# Patient Record
Sex: Male | Born: 1970
Health system: Southern US, Community
[De-identification: ages and names within clinical notes are randomized; demographics above are authoritative.]

## PROBLEM LIST (undated history)

## (undated) DIAGNOSIS — K219 Gastro-esophageal reflux disease without esophagitis: Secondary | ICD-10-CM

## (undated) DIAGNOSIS — E119 Type 2 diabetes mellitus without complications: Secondary | ICD-10-CM

## (undated) DIAGNOSIS — G2581 Restless legs syndrome: Secondary | ICD-10-CM

## (undated) DIAGNOSIS — M545 Low back pain, unspecified: Secondary | ICD-10-CM

## (undated) DIAGNOSIS — J45909 Unspecified asthma, uncomplicated: Secondary | ICD-10-CM

## (undated) DIAGNOSIS — M109 Gout, unspecified: Secondary | ICD-10-CM

## (undated) DIAGNOSIS — L209 Atopic dermatitis, unspecified: Secondary | ICD-10-CM

## (undated) HISTORY — PX: EYE SURGERY: SHX253

## (undated) HISTORY — DX: Low back pain: M54.5

## (undated) HISTORY — DX: Type 2 diabetes mellitus without complications: E11.9

## (undated) HISTORY — DX: Gastro-esophageal reflux disease without esophagitis: K21.9

## (undated) HISTORY — DX: Atopic dermatitis, unspecified: L20.9

## (undated) HISTORY — DX: Low back pain, unspecified: M54.50

## (undated) HISTORY — DX: Unspecified asthma, uncomplicated: J45.909

---

## 1998-06-18 ENCOUNTER — Emergency Department (HOSPITAL_COMMUNITY): Admission: EM | Admit: 1998-06-18 | Discharge: 1998-06-18 | Payer: Self-pay | Admitting: Emergency Medicine

## 2001-01-09 ENCOUNTER — Emergency Department (HOSPITAL_COMMUNITY): Admission: EM | Admit: 2001-01-09 | Discharge: 2001-01-09 | Payer: Self-pay | Admitting: Emergency Medicine

## 2002-08-07 ENCOUNTER — Emergency Department (HOSPITAL_COMMUNITY): Admission: EM | Admit: 2002-08-07 | Discharge: 2002-08-07 | Payer: Self-pay | Admitting: Emergency Medicine

## 2004-08-18 ENCOUNTER — Ambulatory Visit: Payer: Self-pay | Admitting: Internal Medicine

## 2004-08-24 ENCOUNTER — Ambulatory Visit: Payer: Self-pay | Admitting: Family Medicine

## 2004-08-25 ENCOUNTER — Encounter: Payer: Self-pay | Admitting: Physician Assistant

## 2004-08-31 ENCOUNTER — Ambulatory Visit: Payer: Self-pay | Admitting: *Deleted

## 2004-09-07 ENCOUNTER — Ambulatory Visit: Payer: Self-pay | Admitting: Family Medicine

## 2004-09-21 ENCOUNTER — Ambulatory Visit: Payer: Self-pay | Admitting: Internal Medicine

## 2004-09-26 ENCOUNTER — Emergency Department (HOSPITAL_COMMUNITY): Admission: EM | Admit: 2004-09-26 | Discharge: 2004-09-26 | Payer: Self-pay | Admitting: Emergency Medicine

## 2004-11-29 ENCOUNTER — Ambulatory Visit: Payer: Self-pay | Admitting: Family Medicine

## 2005-01-10 ENCOUNTER — Ambulatory Visit: Payer: Self-pay | Admitting: Family Medicine

## 2005-01-19 ENCOUNTER — Ambulatory Visit: Payer: Self-pay | Admitting: Family Medicine

## 2005-02-11 ENCOUNTER — Ambulatory Visit: Payer: Self-pay | Admitting: Family Medicine

## 2005-03-14 ENCOUNTER — Ambulatory Visit: Payer: Self-pay | Admitting: Family Medicine

## 2005-04-13 ENCOUNTER — Ambulatory Visit: Payer: Self-pay | Admitting: Family Medicine

## 2005-05-09 ENCOUNTER — Ambulatory Visit: Payer: Self-pay | Admitting: Family Medicine

## 2005-07-12 ENCOUNTER — Ambulatory Visit: Payer: Self-pay | Admitting: Family Medicine

## 2005-08-12 ENCOUNTER — Ambulatory Visit (HOSPITAL_COMMUNITY): Admission: RE | Admit: 2005-08-12 | Discharge: 2005-08-12 | Payer: Self-pay | Admitting: Family Medicine

## 2005-09-05 ENCOUNTER — Ambulatory Visit: Payer: Self-pay | Admitting: Family Medicine

## 2005-10-06 ENCOUNTER — Ambulatory Visit: Payer: Self-pay | Admitting: Internal Medicine

## 2005-10-10 ENCOUNTER — Ambulatory Visit: Payer: Self-pay | Admitting: Family Medicine

## 2005-11-17 ENCOUNTER — Ambulatory Visit: Payer: Self-pay | Admitting: Family Medicine

## 2005-12-13 ENCOUNTER — Ambulatory Visit: Payer: Self-pay | Admitting: Family Medicine

## 2006-01-10 ENCOUNTER — Ambulatory Visit: Payer: Self-pay | Admitting: Family Medicine

## 2006-04-11 ENCOUNTER — Ambulatory Visit: Payer: Self-pay | Admitting: Family Medicine

## 2006-04-11 LAB — CONVERTED CEMR LAB: WBC, blood: 9.7 10*3/uL

## 2006-06-12 ENCOUNTER — Ambulatory Visit: Payer: Self-pay | Admitting: Family Medicine

## 2006-06-12 LAB — CONVERTED CEMR LAB: WBC, blood: 9.4 10*3/uL

## 2006-07-25 ENCOUNTER — Ambulatory Visit: Payer: Self-pay | Admitting: Family Medicine

## 2006-09-01 ENCOUNTER — Encounter: Admission: RE | Admit: 2006-09-01 | Discharge: 2006-09-11 | Payer: Self-pay | Admitting: Family Medicine

## 2006-10-11 ENCOUNTER — Ambulatory Visit: Payer: Self-pay | Admitting: Family Medicine

## 2006-10-11 LAB — CONVERTED CEMR LAB: WBC, blood: 9.4 10*3/uL

## 2007-01-15 ENCOUNTER — Ambulatory Visit: Payer: Self-pay | Admitting: Family Medicine

## 2007-01-15 LAB — CONVERTED CEMR LAB: WBC, blood: 12.7 10*3/uL

## 2007-02-24 ENCOUNTER — Encounter (INDEPENDENT_AMBULATORY_CARE_PROVIDER_SITE_OTHER): Payer: Self-pay | Admitting: Family Medicine

## 2007-02-24 DIAGNOSIS — Z8709 Personal history of other diseases of the respiratory system: Secondary | ICD-10-CM | POA: Insufficient documentation

## 2007-02-24 DIAGNOSIS — H01009 Unspecified blepharitis unspecified eye, unspecified eyelid: Secondary | ICD-10-CM | POA: Insufficient documentation

## 2007-02-24 DIAGNOSIS — M545 Low back pain, unspecified: Secondary | ICD-10-CM | POA: Insufficient documentation

## 2007-02-24 DIAGNOSIS — F411 Generalized anxiety disorder: Secondary | ICD-10-CM | POA: Insufficient documentation

## 2007-02-24 DIAGNOSIS — J45909 Unspecified asthma, uncomplicated: Secondary | ICD-10-CM | POA: Insufficient documentation

## 2007-02-24 DIAGNOSIS — L2089 Other atopic dermatitis: Secondary | ICD-10-CM | POA: Insufficient documentation

## 2007-04-16 ENCOUNTER — Emergency Department (HOSPITAL_COMMUNITY): Admission: EM | Admit: 2007-04-16 | Discharge: 2007-04-16 | Payer: Self-pay | Admitting: Emergency Medicine

## 2007-05-25 ENCOUNTER — Ambulatory Visit: Payer: Self-pay | Admitting: Family Medicine

## 2007-05-26 ENCOUNTER — Encounter (INDEPENDENT_AMBULATORY_CARE_PROVIDER_SITE_OTHER): Payer: Self-pay | Admitting: Family Medicine

## 2007-05-26 LAB — CONVERTED CEMR LAB
ALT: 28 units/L (ref 0–53)
AST: 19 units/L (ref 0–37)
Albumin: 4.4 g/dL (ref 3.5–5.2)
Alkaline Phosphatase: 79 units/L (ref 39–117)
BUN: 8 mg/dL (ref 6–23)
Basophils Absolute: 0 10*3/uL (ref 0.0–0.1)
Basophils Relative: 0 % (ref 0–1)
CO2: 24 meq/L (ref 19–32)
Calcium: 9.2 mg/dL (ref 8.4–10.5)
Chloride: 107 meq/L (ref 96–112)
Creatinine, Ser: 0.81 mg/dL (ref 0.40–1.50)
Eosinophils Absolute: 0.3 10*3/uL (ref 0.0–0.7)
Eosinophils Relative: 3 % (ref 0–5)
Glucose, Bld: 114 mg/dL — ABNORMAL HIGH (ref 70–99)
HCT: 46.4 % (ref 39.0–52.0)
Hemoglobin: 15.2 g/dL (ref 13.0–17.0)
Lymphocytes Relative: 29 % (ref 12–46)
Lymphs Abs: 3.3 10*3/uL (ref 0.7–3.3)
MCHC: 32.8 g/dL (ref 30.0–36.0)
MCV: 93 fL (ref 78.0–100.0)
Monocytes Absolute: 1 10*3/uL — ABNORMAL HIGH (ref 0.2–0.7)
Monocytes Relative: 9 % (ref 3–11)
Neutro Abs: 6.8 10*3/uL (ref 1.7–7.7)
Neutrophils Relative %: 59 % (ref 43–77)
Platelets: 234 10*3/uL (ref 150–400)
Potassium: 4.3 meq/L (ref 3.5–5.3)
RBC: 4.99 M/uL (ref 4.22–5.81)
RDW: 15.3 % — ABNORMAL HIGH (ref 11.5–14.0)
Sodium: 143 meq/L (ref 135–145)
Total Bilirubin: 0.3 mg/dL (ref 0.3–1.2)
Total Protein: 7.1 g/dL (ref 6.0–8.3)
WBC: 11.5 10*3/uL — ABNORMAL HIGH (ref 4.0–10.5)

## 2007-08-08 ENCOUNTER — Telehealth (INDEPENDENT_AMBULATORY_CARE_PROVIDER_SITE_OTHER): Payer: Self-pay | Admitting: Family Medicine

## 2007-08-22 ENCOUNTER — Encounter: Payer: Self-pay | Admitting: Family Medicine

## 2007-08-22 ENCOUNTER — Telehealth (INDEPENDENT_AMBULATORY_CARE_PROVIDER_SITE_OTHER): Payer: Self-pay | Admitting: Family Medicine

## 2007-08-23 LAB — CONVERTED CEMR LAB
ALT: 27 units/L (ref 0–53)
AST: 20 units/L (ref 0–37)
Albumin: 4.7 g/dL (ref 3.5–5.2)
Alkaline Phosphatase: 80 units/L (ref 39–117)
BUN: 8 mg/dL (ref 6–23)
Basophils Absolute: 0 10*3/uL (ref 0.0–0.1)
Basophils Relative: 0 % (ref 0–1)
CO2: 24 meq/L (ref 19–32)
Calcium: 9.5 mg/dL (ref 8.4–10.5)
Chloride: 103 meq/L (ref 96–112)
Creatinine, Ser: 0.79 mg/dL (ref 0.40–1.50)
Eosinophils Absolute: 0.4 10*3/uL (ref 0.0–0.7)
Eosinophils Relative: 4 % (ref 0–5)
Glucose, Bld: 67 mg/dL — ABNORMAL LOW (ref 70–99)
HCT: 47.6 % (ref 39.0–52.0)
Hemoglobin: 16.2 g/dL (ref 13.0–17.0)
Lymphocytes Relative: 20 % (ref 12–46)
Lymphs Abs: 1.9 10*3/uL (ref 0.7–3.3)
MCHC: 34 g/dL (ref 30.0–36.0)
MCV: 90.2 fL (ref 78.0–100.0)
Monocytes Absolute: 1.1 10*3/uL — ABNORMAL HIGH (ref 0.2–0.7)
Monocytes Relative: 11 % (ref 3–11)
Neutro Abs: 6.4 10*3/uL (ref 1.7–7.7)
Neutrophils Relative %: 65 % (ref 43–77)
Platelets: 271 10*3/uL (ref 150–400)
Potassium: 4.5 meq/L (ref 3.5–5.3)
RBC: 5.28 M/uL (ref 4.22–5.81)
RDW: 13.7 % (ref 11.5–14.0)
Sodium: 140 meq/L (ref 135–145)
Total Bilirubin: 0.3 mg/dL (ref 0.3–1.2)
Total Protein: 7.3 g/dL (ref 6.0–8.3)
WBC: 9.8 10*3/uL (ref 4.0–10.5)

## 2007-08-28 ENCOUNTER — Ambulatory Visit (HOSPITAL_COMMUNITY): Admission: RE | Admit: 2007-08-28 | Discharge: 2007-08-28 | Payer: Self-pay | Admitting: Family Medicine

## 2007-08-28 ENCOUNTER — Encounter (INDEPENDENT_AMBULATORY_CARE_PROVIDER_SITE_OTHER): Payer: Self-pay | Admitting: Family Medicine

## 2007-10-12 ENCOUNTER — Telehealth (INDEPENDENT_AMBULATORY_CARE_PROVIDER_SITE_OTHER): Payer: Self-pay | Admitting: Family Medicine

## 2007-11-20 ENCOUNTER — Telehealth (INDEPENDENT_AMBULATORY_CARE_PROVIDER_SITE_OTHER): Payer: Self-pay | Admitting: Family Medicine

## 2007-12-07 ENCOUNTER — Encounter (INDEPENDENT_AMBULATORY_CARE_PROVIDER_SITE_OTHER): Payer: Self-pay | Admitting: Family Medicine

## 2007-12-19 ENCOUNTER — Encounter (INDEPENDENT_AMBULATORY_CARE_PROVIDER_SITE_OTHER): Payer: Self-pay | Admitting: Family Medicine

## 2007-12-20 ENCOUNTER — Encounter: Payer: Self-pay | Admitting: Physician Assistant

## 2008-05-11 ENCOUNTER — Telehealth (INDEPENDENT_AMBULATORY_CARE_PROVIDER_SITE_OTHER): Payer: Self-pay | Admitting: *Deleted

## 2008-06-11 ENCOUNTER — Ambulatory Visit: Payer: Self-pay | Admitting: Family Medicine

## 2008-06-11 DIAGNOSIS — G4700 Insomnia, unspecified: Secondary | ICD-10-CM | POA: Insufficient documentation

## 2008-08-21 ENCOUNTER — Telehealth (INDEPENDENT_AMBULATORY_CARE_PROVIDER_SITE_OTHER): Payer: Self-pay | Admitting: Family Medicine

## 2008-08-21 LAB — CONVERTED CEMR LAB
ALT: 30 units/L (ref 0–53)
AST: 18 units/L (ref 0–37)
Albumin: 5.1 g/dL (ref 3.5–5.2)
Alkaline Phosphatase: 72 units/L (ref 39–117)
BUN: 6 mg/dL (ref 6–23)
CO2: 24 meq/L (ref 19–32)
Calcium: 9.6 mg/dL (ref 8.4–10.5)
Chloride: 104 meq/L (ref 96–112)
Cholesterol: 243 mg/dL — ABNORMAL HIGH (ref 0–200)
Creatinine, Ser: 0.72 mg/dL (ref 0.40–1.50)
Glucose, Bld: 62 mg/dL — ABNORMAL LOW (ref 70–99)
HDL: 47 mg/dL (ref >39)
LDL Cholesterol: 127 mg/dL — ABNORMAL HIGH (ref 0–99)
Potassium: 4.8 meq/L (ref 3.5–5.3)
Sodium: 143 meq/L (ref 135–145)
Total Bilirubin: 0.3 mg/dL (ref 0.3–1.2)
Total CHOL/HDL Ratio: 5.2
Total Protein: 7.7 g/dL (ref 6.0–8.3)
Triglycerides: 345 mg/dL — ABNORMAL HIGH (ref <150)
VLDL: 69 mg/dL — ABNORMAL HIGH (ref 0–40)

## 2008-08-22 ENCOUNTER — Encounter (INDEPENDENT_AMBULATORY_CARE_PROVIDER_SITE_OTHER): Payer: Self-pay | Admitting: Family Medicine

## 2008-08-22 ENCOUNTER — Ambulatory Visit (HOSPITAL_COMMUNITY): Admission: RE | Admit: 2008-08-22 | Discharge: 2008-08-22 | Payer: Self-pay | Admitting: Dermatology

## 2008-08-22 ENCOUNTER — Encounter (INDEPENDENT_AMBULATORY_CARE_PROVIDER_SITE_OTHER): Payer: Self-pay | Admitting: Radiology

## 2008-11-07 ENCOUNTER — Encounter (INDEPENDENT_AMBULATORY_CARE_PROVIDER_SITE_OTHER): Payer: Self-pay | Admitting: Family Medicine

## 2008-11-11 ENCOUNTER — Encounter (INDEPENDENT_AMBULATORY_CARE_PROVIDER_SITE_OTHER): Payer: Self-pay | Admitting: Family Medicine

## 2008-12-22 ENCOUNTER — Ambulatory Visit: Payer: Self-pay | Admitting: Family Medicine

## 2009-05-11 ENCOUNTER — Ambulatory Visit: Payer: Self-pay | Admitting: Physician Assistant

## 2009-07-07 ENCOUNTER — Ambulatory Visit: Payer: Self-pay | Admitting: Physician Assistant

## 2009-07-07 LAB — CONVERTED CEMR LAB
ALT: 28 units/L (ref 0–53)
AST: 19 units/L (ref 0–37)
Albumin: 4.8 g/dL (ref 3.5–5.2)
Alkaline Phosphatase: 80 units/L (ref 39–117)
BUN: 12 mg/dL (ref 6–23)
Basophils Absolute: 0 10*3/uL (ref 0.0–0.1)
Basophils Relative: 0 % (ref 0–1)
CO2: 25 meq/L (ref 19–32)
Calcium: 9.7 mg/dL (ref 8.4–10.5)
Chloride: 106 meq/L (ref 96–112)
Creatinine, Ser: 0.96 mg/dL (ref 0.40–1.50)
Eosinophils Absolute: 0.4 10*3/uL (ref 0.0–0.7)
Eosinophils Relative: 4 % (ref 0–5)
Glucose, Bld: 92 mg/dL (ref 70–99)
HCT: 46.1 % (ref 39.0–52.0)
Hemoglobin: 15.3 g/dL (ref 13.0–17.0)
Lymphocytes Relative: 24 % (ref 12–46)
Lymphs Abs: 2.5 10*3/uL (ref 0.7–4.0)
MCHC: 33.2 g/dL (ref 30.0–36.0)
MCV: 88.3 fL (ref 78.0–100.0)
Monocytes Absolute: 1.2 10*3/uL — ABNORMAL HIGH (ref 0.1–1.0)
Monocytes Relative: 12 % (ref 3–12)
Neutro Abs: 6.1 10*3/uL (ref 1.7–7.7)
Neutrophils Relative %: 60 % (ref 43–77)
Platelets: 248 10*3/uL (ref 150–400)
Potassium: 5.1 meq/L (ref 3.5–5.3)
RBC: 5.22 M/uL (ref 4.22–5.81)
RDW: 14.5 % (ref 11.5–15.5)
Sodium: 141 meq/L (ref 135–145)
Total Bilirubin: 0.3 mg/dL (ref 0.3–1.2)
Total Protein: 7.4 g/dL (ref 6.0–8.3)
WBC: 10.1 10*3/uL (ref 4.0–10.5)

## 2009-07-08 ENCOUNTER — Telehealth: Payer: Self-pay | Admitting: Physician Assistant

## 2009-07-14 ENCOUNTER — Encounter: Payer: Self-pay | Admitting: Physician Assistant

## 2009-07-27 ENCOUNTER — Encounter: Payer: Self-pay | Admitting: Physician Assistant

## 2009-12-04 ENCOUNTER — Encounter: Payer: Self-pay | Admitting: Physician Assistant

## 2010-05-21 ENCOUNTER — Ambulatory Visit: Payer: Self-pay | Admitting: Physician Assistant

## 2010-05-21 DIAGNOSIS — F329 Major depressive disorder, single episode, unspecified: Secondary | ICD-10-CM | POA: Insufficient documentation

## 2010-05-21 DIAGNOSIS — G2581 Restless legs syndrome: Secondary | ICD-10-CM | POA: Insufficient documentation

## 2010-05-24 ENCOUNTER — Ambulatory Visit: Payer: Self-pay | Admitting: Physician Assistant

## 2010-05-25 LAB — CONVERTED CEMR LAB
ALT: 33 units/L (ref 0–53)
AST: 23 units/L (ref 0–37)
Albumin: 4.7 g/dL (ref 3.5–5.2)
Alkaline Phosphatase: 62 units/L (ref 39–117)
BUN: 11 mg/dL (ref 6–23)
Basophils Absolute: 0 10*3/uL (ref 0.0–0.1)
Basophils Relative: 0 % (ref 0–1)
CO2: 24 meq/L (ref 19–32)
Calcium: 9.7 mg/dL (ref 8.4–10.5)
Chloride: 104 meq/L (ref 96–112)
Creatinine, Ser: 1.03 mg/dL (ref 0.40–1.50)
Eosinophils Absolute: 0.4 10*3/uL (ref 0.0–0.7)
Eosinophils Relative: 5 % (ref 0–5)
Ferritin: 191 ng/mL (ref 22–322)
Glucose, Bld: 124 mg/dL — ABNORMAL HIGH (ref 70–99)
HCT: 42.9 % (ref 39.0–52.0)
Hemoglobin: 13.9 g/dL (ref 13.0–17.0)
Lymphocytes Relative: 23 % (ref 12–46)
Lymphs Abs: 1.9 10*3/uL (ref 0.7–4.0)
MCHC: 32.4 g/dL (ref 30.0–36.0)
MCV: 89.7 fL (ref 78.0–100.0)
Monocytes Absolute: 0.8 10*3/uL (ref 0.1–1.0)
Monocytes Relative: 9 % (ref 3–12)
Neutro Abs: 5.4 10*3/uL (ref 1.7–7.7)
Neutrophils Relative %: 63 % (ref 43–77)
Platelets: 241 10*3/uL (ref 150–400)
Potassium: 4.6 meq/L (ref 3.5–5.3)
RBC: 4.78 M/uL (ref 4.22–5.81)
RDW: 14.4 % (ref 11.5–15.5)
Sodium: 141 meq/L (ref 135–145)
Total Bilirubin: 0.3 mg/dL (ref 0.3–1.2)
Total Protein: 7.1 g/dL (ref 6.0–8.3)
WBC: 8.4 10*3/uL (ref 4.0–10.5)

## 2010-06-04 ENCOUNTER — Ambulatory Visit: Payer: Self-pay | Admitting: Physician Assistant

## 2010-06-04 DIAGNOSIS — E739 Lactose intolerance, unspecified: Secondary | ICD-10-CM | POA: Insufficient documentation

## 2010-06-04 LAB — CONVERTED CEMR LAB: Hgb A1c MFr Bld: 6.2 %

## 2010-06-18 ENCOUNTER — Telehealth: Payer: Self-pay | Admitting: Physician Assistant

## 2010-06-18 ENCOUNTER — Ambulatory Visit: Payer: Self-pay | Admitting: Physician Assistant

## 2010-07-16 ENCOUNTER — Ambulatory Visit: Payer: Self-pay | Admitting: Physician Assistant

## 2010-07-16 ENCOUNTER — Telehealth: Payer: Self-pay | Admitting: Physician Assistant

## 2010-07-20 ENCOUNTER — Ambulatory Visit: Payer: Self-pay | Admitting: Physician Assistant

## 2010-07-20 LAB — CONVERTED CEMR LAB: Blood Glucose, Fingerstick: 147

## 2010-08-02 ENCOUNTER — Ambulatory Visit: Payer: Self-pay | Admitting: Physician Assistant

## 2010-08-17 ENCOUNTER — Encounter: Payer: Self-pay | Admitting: Physician Assistant

## 2010-08-17 ENCOUNTER — Ambulatory Visit: Payer: Self-pay | Admitting: Internal Medicine

## 2010-08-26 ENCOUNTER — Ambulatory Visit: Payer: Self-pay | Admitting: Nurse Practitioner

## 2010-10-01 ENCOUNTER — Encounter: Payer: Self-pay | Admitting: Physician Assistant

## 2010-10-19 ENCOUNTER — Ambulatory Visit: Payer: Self-pay | Admitting: Internal Medicine

## 2010-10-22 ENCOUNTER — Encounter: Payer: Self-pay | Admitting: Physician Assistant

## 2010-11-25 NOTE — Progress Notes (Signed)
Summary: Increase Trazodone.  Phone Note Other Incoming   Summary of Call: Spoke to AAnanias Pilgrim.  Pt in today for counseling.  Notes trouble sleeping.  Trazodone helps.  But, not enough. Will increase to 100 mg at bedtime as needed. Initial call taken by: Brynda Rim,  July 16, 2010 9:19 AM    New/Updated Medications: TRAZODONE HCL 100 MG TABS (TRAZODONE HCL) Take 1 tab by mouth at bedtime as needed for sleep Prescriptions: TRAZODONE HCL 100 MG TABS (TRAZODONE HCL) Take 1 tab by mouth at bedtime as needed for sleep  #20 per month x 3   Entered and Authorized by:   Tereso Newcomer PA-C   Signed by:   Tereso Newcomer PA-C on 07/16/2010   Method used:   Print then Give to Patient   RxID:   [id number]

## 2010-11-25 NOTE — Assessment & Plan Note (Signed)
Summary: check liver fuction/ discuss medications//gk   Vital Signs:  Patient profile:   40 year old male Weight:      212.5 pounds Temp:     97.7 degrees F oral Pulse rhythm:   regular Resp:     20 per minute BP sitting:   138 / 92  (left arm) Cuff size:   regular  Vitals Entered By: Mikey College CMA (June 24, 2009 9:32 AM) CC: PT HERE FOR 6WK F/U Pain Assessment Patient in pain? yes     Location: LBP Intensity: 3-4  Does patient need assistance? Functional Status Self care Ambulation Normal   CC:  PT HERE FOR 6WK F/U.  History of Present Illness: pt missed appointmetn yesterday on follow up with Scott.  He didn't know his rechedule today was with another provider.  He only wanted to see Lorin Picket, so he will be rescheduled to see Scott at pt request.  Allergies: 1)  ! * Peanuts  Physical Exam  General:  Well-developed,well-nourished,in no acute distress; alert,appropriate and cooperative throughout examination Psych:  frustrated and mildly aggitated about appointment   Impression & Recommendations:  Problem # 1:  FOLLOW-UP, HIGH RISK TREATMENT NEC (ICD-V67.51) pt will be rescheduled for PCP  Complete Medication List: 1)  Folic Acid 1 Mg Tabs (Folic acid) .Marland Kitchen.. 1 by mouth once daily 2)  Methotrexate 2.5 Mg Tabs (Methotrexate sodium) .... Take 6 tablets by mouth .qweek per dermatologist 3)  Elidel 1% Cream  .... Apply to rash two times a day 4)  Atarax 25 Mg  .Marland KitchenMarland Kitchen. 1 by mouth at bedtime as needed for itching 5)  Albuterol 90 Mcg/act Aers (Albuterol) .... 2puffs q4-6hrs prn 6)  Fish Oil Oil (Fish oil) .... 2 capsules per day 7)  Doxycycline Monohydrate 50 Mg Tabs (Doxycycline monohydrate) .... Take 1 tablet by mouth once a day from dr.gould(chronic) 8)  Seroquel 25 Mg Tabs (Quetiapine fumarate) .... Take 1 tab by mouth at bedtime 9)  Combivent 103-18 Mcg/act Aero (Ipratropium-albuterol) .Marland Kitchen.. 1-2 puffs three to four times a day 10)  Pepcid 20 Mg Tabs (Famotidine)  .... Take 1 tablet by mouth two times a day

## 2010-11-25 NOTE — Letter (Signed)
[facility]   Imported By: Arta Bruce 01/26/2010 15:54:23  _____________________________________________________________________  External Attachment:    Type:   Image     Comment:   External Document

## 2010-11-25 NOTE — Assessment & Plan Note (Signed)
Summary: SCHED WITH SCOTT IN 2-3 WEEKS FBS,A1C//GK   Vital Signs:  Patient profile:   40 year old male Height:      56.5 inches Weight:      217 pounds BMI:     47.97 O2 Sat:      97 % on Room air Temp:     97.4 degrees F oral Pulse rate:   82 / minute Pulse rhythm:   regular Resp:     18 per minute BP sitting:   132 / 88  (left arm) Cuff size:   regular  Vitals Entered By: [name] (June 04, 2010 11:12 AM)  O2 Flow:  Room air CC: f/u.... Is Patient Diabetic? No Pain Assessment Patient in pain? no       Does patient need assistance? Functional Status Self care Ambulation Normal   Primary Care Provider:  Tereso Newcomer PA-C  CC:  f/u.....  History of Present Illness: Here for f/u on depression. Stressed with dad.  Needs CABG.  Grandmother has Alzheimer's. Started Prozac.  No side effects.  He denies suicidal ideations.   Current Medications (verified): 1)  Folic Acid 400 Mcg Tabs (Folic Acid) .... Take 1 Tablet By Mouth Once A Day 2)  Methotrexate 2.5 Mg  Tabs (Methotrexate Sodium) .... Take 4 Tabs By Mouth Every Weeks 3)  Elidel 1% Cream + Hydrocortisone Valeate Crm0.2% - 50/50 Mix .... Apply To Rash Two Times A Day As Directed 4)  Hydroxyzine Pamoate 25 Mg Caps (Hydroxyzine Pamoate) .Marland Kitchen.. 1 To 4 By Mouth Once Daily As Needed 5)  Fish Oil   Oil (Fish Oil) .... 2 Capsules Per Day 6)  Doxycycline Hyclate 100 Mg Tabs (Doxycycline Hyclate) .... Take 1 Tablet By Mouth Once A Day 7)  Combivent 103-18 Mcg/act Aero (Ipratropium-Albuterol) .Marland Kitchen.. 1-2 Puffs Three To Four Times A Day 8)  Pepcid 20 Mg Tabs (Famotidine) .... Take 1 Tab By Mouth At Bedtime As Needed 9)  Clobetasol Propionate 0.05 % Crea (Clobetasol Propionate) .... As Directed 10)  Cromolyn Sodium 4 % Soln (Cromolyn Sodium) .... Eye Drops 4 Times A Day 11)  Fml 0.1 % Oint (Fluorometholone) .Marland Kitchen.. 1 Week On and 1 Week Off 12)  Refresh Tears 0.5 % Soln (Carboxymethylcellulose Sodium) .... As Directed 13)   Prozac 20 Mg Caps (Fluoxetine Hcl) .... Take 1 Tablet By Mouth Once A Day 14)  Trazodone Hcl 50 Mg Tabs (Trazodone Hcl) .... Take 1 Tab By Mouth At Bedtime As Needed For Sleep  Allergies (verified): 1)  ! * Peanuts  Physical Exam  General:  alert, well-developed, and well-nourished.   Head:  normocephalic and atraumatic.   Lungs:  normal breath sounds, no crackles, and no wheezes.   Heart:  normal rate and regular rhythm.   Neurologic:  alert & oriented X3 and cranial nerves II-XII intact.   Psych:  flat affect.     Impression & Recommendations:  Problem # 1:  DEPRESSION (ICD-311) has appt with Marchelle Folks today no problems with xanax trazodone helping with sleep  His updated medication list for this problem includes:    Hydroxyzine Pamoate 25 Mg Caps (Hydroxyzine pamoate) .Marland Kitchen... 1 to 4 by mouth once daily as needed    Prozac 20 Mg Caps (Fluoxetine hcl) .Marland Kitchen... Take 1 tablet by mouth once a day    Trazodone Hcl 50 Mg Tabs (Trazodone hcl) .Marland Kitchen... Take 1 tab by mouth at bedtime as needed for sleep  Problem # 2:  ASTHMA (ICD-493.90) sent PA off for  asmanex will start now  His updated medication list for this problem includes:    Combivent 103-18 Mcg/act Aero (Ipratropium-albuterol) .Marland Kitchen... 1-2 puffs three to four times a day    Asmanex 30 Metered Doses 220 Mcg/inh Aepb (Mometasone furoate) .Marland Kitchen... 1 inhalation at bedtime; rinse mouth out well after each use.  Problem # 3:  HYPERGLYCEMIA (ICD-790.29) d/w Laroy Apple regarding glucose intol schedule with Susie for diet  Orders: Hemoglobin A1C (83036)  Complete Medication List: 1)  Folic Acid 400 Mcg Tabs (Folic acid) .... Take 1 tablet by mouth once a day 2)  Methotrexate 2.5 Mg Tabs (Methotrexate sodium) .... Take 4 tabs by mouth every weeks 3)  Elidel 1% Cream + Hydrocortisone Valeate Crm0.2% - 50/50 Mix  .... Apply to rash two times a day as directed 4)  Hydroxyzine Pamoate 25 Mg Caps (Hydroxyzine pamoate) .Marland Kitchen.. 1 to 4 by mouth once  daily as needed 5)  Fish Oil Oil (Fish oil) .... 2 capsules per day 6)  Doxycycline Hyclate 100 Mg Tabs (Doxycycline hyclate) .... Take 1 tablet by mouth once a day 7)  Combivent 103-18 Mcg/act Aero (Ipratropium-albuterol) .Marland Kitchen.. 1-2 puffs three to four times a day 8)  Pepcid 20 Mg Tabs (Famotidine) .... Take 1 tab by mouth at bedtime as needed 9)  Clobetasol Propionate 0.05 % Crea (Clobetasol propionate) .... As directed 10)  Cromolyn Sodium 4 % Soln (Cromolyn sodium) .... Eye drops 4 times a day 11)  Fml 0.1 % Oint (Fluorometholone) .Marland KitchenMarland KitchenMarland Kitchen 1 week on and 1 week off 12)  Refresh Tears 0.5 % Soln (Carboxymethylcellulose sodium) .... As directed 13)  Prozac 20 Mg Caps (Fluoxetine hcl) .... Take 1 tablet by mouth once a day 14)  Trazodone Hcl 50 Mg Tabs (Trazodone hcl) .... Take 1 tab by mouth at bedtime as needed for sleep 15)  Asmanex 30 Metered Doses 220 Mcg/inh Aepb (Mometasone furoate) .Marland Kitchen.. 1 inhalation at bedtime; rinse mouth out well after each use.  Patient Instructions: 1)  Please schedule a follow-up appointment in 6-8 weeks with Scott for asthma and depression.  2)  Schedule appt with Susie Piper for diet education (glucose intolerance). Prescriptions: TRAZODONE HCL 50 MG TABS (TRAZODONE HCL) Take 1 tab by mouth at bedtime as needed for sleep  #20 per month x 3   Entered and Authorized by:   Tereso Newcomer PA-C   Signed by:   Tereso Newcomer PA-C on 06/04/2010   Method used:   Print then Give to Patient   RxID:   [id number] ASMANEX 30 METERED DOSES 220 MCG/INH AEPB (MOMETASONE FUROATE) 1 inhalation at bedtime; rinse mouth out well after each use.  #1 x 5   Entered and Authorized by:   Tereso Newcomer PA-C   Signed by:   Tereso Newcomer PA-C on 06/04/2010   Method used:   Print then Give to Patient   RxID:   [id number]   Laboratory Results   Blood Tests     HGBA1C: 6.2%   (Normal Range: Non-Diabetic - 3-6%   Control Diabetic - 6-8%)

## 2010-11-25 NOTE — Medication Information (Signed)
Summary: PA APPROVED/ASMANEX  PA APPROVED/ASMANEX   Imported By: Arta Bruce 06/16/2010 12:53:47  _____________________________________________________________________  External Attachment:    Type:   Image     Comment:   External Document

## 2010-11-25 NOTE — Letter (Signed)
Summary: MAILED RECORDS TO [facility]  MAILED RECORDS TO [employer]   Imported By: Arta Bruce 10/22/2010 09:38:16  _____________________________________________________________________  External Attachment:    Type:   Image     Comment:   External Document

## 2010-11-25 NOTE — Assessment & Plan Note (Signed)
Summary: RU VISIT WITH SCOTT//GK   Vital Signs:  Patient profile:   40 year old male Height:      56.5 inches Weight:      219 pounds BMI:     48.41 O2 Sat:      99 % on Room air Temp:     97.6 degrees F oral Pulse rate:   80 / minute Pulse rhythm:   regular Resp:     18 per minute BP sitting:   137 / 93  (left arm) Cuff size:   regular  Vitals Entered By: [name] (May 21, 2010 9:20 AM)  O2 Flow:  Room air CC: f/u Is Patient Diabetic? No Pain Assessment Patient in pain? no       Does patient need assistance? Functional Status Self care Ambulation Normal Comments PF  1.   320    2.  400      3.  390   Primary Care Provider:  Tereso Newcomer PA-C  CC:  f/u.  History of Present Illness: Here for f/u.  Atopic Dermatitis:  Sees Dr. Mayford Knife for dermatology.  Still on MTX . Marland Kitchen . takes 4 tabs q week.  States dermatology draws his labs every 3-4 months.    Blepharitis:  Sees ophthalmology.  Asthma:  Was taking Asmanex with the allergist.  It was helping.  Medicaid would not pay.  Very upset when I walk in.  Had a hard time getting his blood drawn.  Shows me his arm and states, "I am going to get a picture of this when I get home and show it to the right people."  States the CMA was laughing about him when they walked out of room.  I left to take a phone call and the patient was crying when I walked back in.  He does admit to depression and feeling on edge more.  Denies suicidal plans.  Lives with his dad.  Has trouble sleeping.  Does not work.  On disability from his atopy and asthma.  Notes restless legs about 1 time every 1-2 months.  Has to get up and move around to feel better.  Also, having trouble remembering things.  Asthma History    Initial Asthma Severity Rating:    Age range: 12+ years    Symptoms: daily    Nighttime Awakenings: 0-2/month    Interferes w/ normal activity: minor limitations    SABA use (not for EIB): daily    Asthma Severity Assessment:  Moderate Persistent   Problems Prior to Update: 1)  F/u Exam Follow Cmpl Tx W/high-risk Med Nec  (ICD-V67.51) 2)  Cholesterol, Screening  (ICD-V77.91) 3)  Insomnia  (ICD-780.52) 4)  Follow-up, High Risk Treatment Nec  (ICD-V67.51) 5)  Ekg  () 6)  Steroid Use, Long Term  (ICD-V58.65) 7)  Back Pain  (ICD-724.5) 8)  Dermatitis, Other Atopic  (ICD-691.8) 9)  Anxiety Disorder, Generalized  (ICD-300.02) 10)  Blepharitis Nos  (ICD-373.00) 11)  Pneumonia, Hx of  (ICD-V12.60) 12)  Asthma  (ICD-493.90)  Current Medications (verified): 1)  Folic Acid 1 Mg Tabs (Folic Acid) .Marland Kitchen.. 1 By Mouth Once Daily 2)  Methotrexate 2.5 Mg  Tabs (Methotrexate Sodium) .... Take 6 Tablets By Mouth .qweek Per Dermatologist 3)  Elidel 1% Cream .... Apply To Rash Two Times A Day 4)  Atarax 25 Mg .Marland Kitchen.. 1 By Mouth At Bedtime As Needed For Itching 5)  Albuterol 90 Mcg/act Aers (Albuterol) .... 2puffs Q4-6hrs Prn 6)  Fish Oil  Oil (Fish Oil) .... 2 Capsules Per Day 7)  Doxycycline Monohydrate 50 Mg  Tabs (Doxycycline Monohydrate) .... Take 1 Tablet By Mouth Once A Day From Dr.gould(Chronic) 8)  Seroquel 25 Mg  Tabs (Quetiapine Fumarate) .... Take 1 Tab By Mouth At Bedtime 9)  Combivent 103-18 Mcg/act Aero (Ipratropium-Albuterol) .Marland Kitchen.. 1-2 Puffs Three To Four Times A Day 10)  Pepcid 20 Mg Tabs (Famotidine) .... Take 1 Tablet By Mouth Two Times A Day 11)  Flovent Hfa 220 Mcg/act Aero (Fluticasone Propionate  Hfa) .... 2 Puffs Once Daily (Dr. Beaulah Dinning) 12)  Hydroxyzine Pamoate 25 Mg Caps (Hydroxyzine Pamoate) .... One To Four Tabs Daily  Allergies (verified): 1)  ! * Peanuts  Past History:  Past Medical History: Last updated: 05/11/2009  BACK PAIN (ICD-724.5) DERMATITIS, OTHER ATOPIC (ICD-691.8)    a.  sees Dr. Mayford Knife (derm.) at [facility] and now takes methotrexate (on med. x 2 years)    b.  liver bx. done last year due to methotrexate tx. ANXIETY DISORDER, GENERALIZED (ICD-300.02) BLEPHARITIS NOS (ICD-373.00)     a.  on doxy. 100mg  daily, cromolyn eye gtts,     b.  sees ophthalmology in [place] PNEUMONIA, HX OF (ICD-V12.60) ASTHMA (ICD-493.90) INSOMNIA...chronic. Has tried trazodone 3/06,ativan 5/06,ambien CR 6/06,lunesta 12/06. Was on diazepam 2008-8/09. Trial seroquel 8/09.  Social History: Current Smoker (1ppd since age 2) Alcohol use-no Drug use-no Disabled from atopic dermatitis Single 1 daughter (lives with mother) lives with dad  Physical Exam  General:  alert, well-developed, and well-nourished.   Head:  normocephalic and atraumatic.   Neck:  supple.   Lungs:  normal breath sounds, no crackles, and no wheezes.   Heart:  normal rate and regular rhythm.   Abdomen:  soft.   Extremities:  no edema Neurologic:  alert & oriented X3 and cranial nerves II-XII intact.   Psych:  tearful.     Impression & Recommendations:  Problem # 1:  DEPRESSION (ICD-311)  His updated medication list for this problem includes:    Hydroxyzine Pamoate 25 Mg Caps (Hydroxyzine pamoate) .Marland Kitchen... 1 to 4 by mouth once daily as needed    Prozac 20 Mg Caps (Fluoxetine hcl) .Marland Kitchen... Take 1 tablet by mouth once a day    Trazodone Hcl 50 Mg Tabs (Trazodone hcl) .Marland Kitchen... Take 1 tab by mouth at bedtime as needed for sleep  Orders: Psychology Referral (Psychology)  Problem # 2:  DERMATITIS, OTHER ATOPIC (ICD-691.8)  f/u with derm on methotrexate  His updated medication list for this problem includes:    Clobetasol Propionate 0.05 % Crea (Clobetasol propionate) .Marland Kitchen... As directed  Orders: T-Comprehensive Metabolic Panel [phone]) T-CBC w/Diff ([id number])  Problem # 3:  BLEPHARITIS NOS (ICD-373.00) f/u with ophthalmology  Problem # 4:  ASTHMA (ICD-493.90) refill combivent will try to get PA for Asmanex  The following medications were removed from the medication list:    Albuterol 90 Mcg/act Aers (Albuterol) .Marland Kitchen... 2puffs q4-6hrs prn    Flovent Hfa 220 Mcg/act Aero (Fluticasone propionate  hfa) .Marland Kitchen... 2  puffs once daily (dr. Beaulah Dinning) His updated medication list for this problem includes:    Combivent 103-18 Mcg/act Aero (Ipratropium-albuterol) .Marland Kitchen... 1-2 puffs three to four times a day  Problem # 5:  RESTLESS LEGS SYNDROME (ICD-333.94)  mild and only occurs once a month or less may be related to lack of sleep and depression check cbc and ferritin  Orders: T-Ferritin ([id number])  Complete Medication List: 1)  Folic Acid 400 Mcg Tabs (Folic acid) .... Take  1 tablet by mouth once a day 2)  Methotrexate 2.5 Mg Tabs (Methotrexate sodium) .... Take 4 tabs by mouth every weeks 3)  Elidel 1% Cream + Hydrocortisone Valeate Crm0.2% - 50/50 Mix  .... Apply to rash two times a day as directed 4)  Hydroxyzine Pamoate 25 Mg Caps (Hydroxyzine pamoate) .Marland Kitchen.. 1 to 4 by mouth once daily as needed 5)  Fish Oil Oil (Fish oil) .... 2 capsules per day 6)  Doxycycline Hyclate 100 Mg Tabs (Doxycycline hyclate) .... Take 1 tablet by mouth once a day 7)  Combivent 103-18 Mcg/act Aero (Ipratropium-albuterol) .Marland Kitchen.. 1-2 puffs three to four times a day 8)  Pepcid 20 Mg Tabs (Famotidine) .... Take 1 tab by mouth at bedtime as needed 9)  Clobetasol Propionate 0.05 % Crea (Clobetasol propionate) .... As directed 10)  Cromolyn Sodium 4 % Soln (Cromolyn sodium) .... Eye drops 4 times a day 11)  Fml 0.1 % Oint (Fluorometholone) .Marland KitchenMarland KitchenMarland Kitchen 1 week on and 1 week off 12)  Refresh Tears 0.5 % Soln (Carboxymethylcellulose sodium) .... As directed 13)  Prozac 20 Mg Caps (Fluoxetine hcl) .... Take 1 tablet by mouth once a day 14)  Trazodone Hcl 50 Mg Tabs (Trazodone hcl) .... Take 1 tab by mouth at bedtime as needed for sleep  Patient Instructions: 1)  Try some simple stretches before bedtime to help your legs. 2)  I am checking your iron. 3)  If your restless legs gets worse, we can think about medicines. 4)  Let's focus on the other things for now. 5)  Take Trazodone only as needed for sleep.  Do not take with the Atarax and do  not take every night. 6)  No reading or watching tv in bed.  No exercise before bed.  No alcohol or caffeine before bed. 7)  [place] appointment with Ethelene Browns. 8)  Schedule follow up with Scott in 2-3 weeks. Prescriptions: TRAZODONE HCL 50 MG TABS (TRAZODONE HCL) Take 1 tab by mouth at bedtime as needed for sleep  #15 x 0   Entered and Authorized by:   Tereso Newcomer PA-C   Signed by:   Tereso Newcomer PA-C on 05/21/2010   Method used:   Electronically to        [facility] [address] [phone]* (retail)       [address]       [address]       [place], [place]  [address]       Ph: [phone]       Fax: [phone]   RxID:   [id number] PROZAC 20 MG CAPS (FLUOXETINE HCL) Take 1 tablet by mouth once a day  #30 x 2   Entered and Authorized by:   Tereso Newcomer PA-C   Signed by:   Tereso Newcomer PA-C on 05/21/2010   Method used:   Electronically to        [facility] [address] [phone]* (retail)       [address]       [address]       [place], [place]  [address]       Ph: [phone]       Fax: [phone]   RxID:   [id number] COMBIVENT 103-18 MCG/ACT AERO (IPRATROPIUM-ALBUTEROL) 1-2 puffs three to four times a day  #1 x 11   Entered and Authorized by:   Tereso Newcomer PA-C   Signed by:   Tereso Newcomer PA-C on 05/21/2010   Method used:   Electronically  to        [facility] [address] [phone]* (retail)       [address]       [address]       [place], [place]  [address]       Ph: [phone]       Fax: [phone]   RxID:   [id number]

## 2010-11-25 NOTE — Assessment & Plan Note (Signed)
Summary: ov for further refills/fasting labs//kt   Vital Signs:  Patient Profile:   40 Years Old Male Height:     56.50 inches Weight:      213 pounds BMI:     47.08 BSA:     1.84 Temp:     97.4 degrees F oral Pulse rate:   80 / minute Pulse rhythm:   regular Resp:     20 per minute BP sitting:   140 / 98  (left arm) Cuff size:   regular  Pt. in pain?   no  Vitals Entered By: Levon Hedger (June 11, 2008 8:38 AM)              Is Patient Diabetic? No  Does patient need assistance? Ambulation Normal Comments pt did not bring medications but verbally stated what he is currently taking     Chief Complaint:  follow-up for medication refills.  History of Present Illness: Here for f/u: #1 Still sees Dr.Harrison Turner,DERM, regularly for his severe eczema.On MTX at 2.5mg  tabs 6 tabs qweek. No longer has to take prednisone daily. Pt says he gets his routine labs (for MTX) at Dr.Turner's office.  #2 Has chronic problems with insomnia since he worked for 3rd shift x 12+ years. Uses diazepam 5mg  often 2 at bedtime despite MD direction of 1 at bedtime. Has tried multiple meds in past without success. Trazodone 3/06 ativan 5/06 Ambien CR 6/06 Lunesta 12/06. Only the diazepam has helped some though he says that he still awakens at night and has trouble falling asleep.He still gets sleepy during a certain time during day and we discussed that he should not be taking naps. We discussed that his chronic insomnia will continue to be a psychological problem. His using up the valium in 3 weeks and then having none for a week is worrisome for addiction and MD suggested that he try another medication.   #3 His asthma is fine in terms of daytime activities and he only feels that he has SOB(?) at bedtime. He persists in using the primatene mist despite extensive counseling on our prior visits and my eventual referral to an allergist(Dr.Bardelas) in 2/09. Pt reports that Dr.Bardelas told him  also to stop using the primatene.He was given flovent but didn't feel it helped and so stopped. MD d/w patient that he is probably habitually addicted to the primatene and is using it as a psychological crutch as it is not helping his asthma...at this point MD ended conversation with comment that he was choosing to use primetene against his PCP and specialist's advise(along with his smoking).    Updated Prior Medication List: FOLIC ACID 1 MG TABS (FOLIC ACID) 1 by mouth once daily * METHOTREXATE 2.5MG  Take 5 tablets Po each Sunday * ELIDEL 1% CREAM apply to rash two times a day DIAZEPAM 5 MG TABS (DIAZEPAM) 1 by mouth at bedtime as needed for insomnia * ATARAX 25 MG 1 by mouth at bedtime as needed for itching ALBUTEROL 90 MCG/ACT AERS (ALBUTEROL) 2puffs q4-6hrs prn FISH OIL   OIL (FISH OIL) 2 capsules per day DOXYCYCLINE MONOHYDRATE 50 MG  TABS (DOXYCYCLINE MONOHYDRATE)   Current Allergies (reviewed today): ! * PEANUTS  Past Medical History:    Reviewed history from 08/22/2007 and no changes required:              BACK PAIN (ICD-724.5)       DERMATITIS, OTHER ATOPIC (ICD-691.8)       ANXIETY DISORDER, GENERALIZED (ICD-300.02)  BLEPHARITIS NOS (ICD-373.00)       PNEUMONIA, HX OF (ICD-V12.60)       ASTHMA (ICD-493.90)       INSOMNIA...chronic. Has tried trazodone 3/06,ativan 5/06,ambien CR 6/06,lunesta 12/06. Was on diazepam 2008-8/09. Trial seroquel 8/09.                  Risk Factors:  Tobacco use:  current    Cigarettes:  Yes -- 1ppd pack(s) per day Drug use:  no Caffeine use:  5+ drinks per day Alcohol use:  no Seatbelt use:  100 % Sun Exposure:  occasionally    Physical Exam  General:     Chronic skin changes and blepharitis are MUCH improved Lungs:     Normal respiratory effort, chest expands symmetrically. Lungs are clear to auscultation, no crackles or wheezes. Heart:     Normal rate and regular rhythm. S1 and S2 normal without gallop, murmur, click, rub  or other extra sounds. Psych:     Oriented X3, memory intact for recent and remote, normally interactive, good eye contact, not anxious appearing, and not depressed appearing.   Pt has firm beliefs and does not plan to stop using his primatene mist.    Impression & Recommendations:  Problem # 1:  INSOMNIA (ICD-780.52)  Stop diazepam as not helping much per patient and he has escalated his use to using 2 tabs at bedtime frequently(#30 is only lasting 3 weeks per PT), Trial seroquel 25mg  at bedtime   Orders: T-Lipid Profile ([address]) T-Comprehensive Metabolic Panel (09811-91478)   Problem # 2:  CHOLESTEROL, SCREENING (ICD-V77.91)  Orders: T-Lipid Profile (29562-13086)   Problem # 3:  DERMATITIS, OTHER ATOPIC (ICD-691.8) Sees Dr.Turner,Derm On MTX. Now off chronic prednisone. Has his MTX labs at Dr.Turner's office per patient.  Problem # 4:  ASTHMA (ICD-493.90) Referred to allergist,Dr.Bardelas 2/09. Was to stop using primatene mist(a lifelong problem). Pt has chosen to continue to use the primatene mist despite counseling otherwise. He also stopped the Flovent MDI as he felt it didn't help. His updated medication list for this problem includes:    Albuterol 90 Mcg/act Aers (Albuterol) .Marland Kitchen... 2puffs q4-6hrs prn   Complete Medication List: 1)  Folic Acid 1 Mg Tabs (Folic acid) .Marland Kitchen.. 1 by mouth once daily 2)  Methotrexate 2.5 Mg Tabs (Methotrexate sodium) .... Take 6 tablets by mouth .qweek per dermatologist 3)  Elidel 1% Cream  .... Apply to rash two times a day 4)  Atarax 25 Mg  .Marland KitchenMarland Kitchen. 1 by mouth at bedtime as needed for itching 5)  Albuterol 90 Mcg/act Aers (Albuterol) .... 2puffs q4-6hrs prn 6)  Fish Oil Oil (Fish oil) .... 2 capsules per day 7)  Doxycycline Monohydrate 50 Mg Tabs (Doxycycline monohydrate) .... Take 1 tablet by mouth once a day from dr.gould(chronic) 8)  Seroquel 25 Mg Tabs (Quetiapine fumarate) .... Take 1 tab by mouth at bedtime   Patient  Instructions: 1)  Please schedule a follow-up appointment as needed.   Prescriptions: SEROQUEL 25 MG  TABS (QUETIAPINE FUMARATE) Take 1 tab by mouth at bedtime  #30 x 0   Entered and Authorized by:   Beverley Fiedler MD   Signed by:   Beverley Fiedler MD on 06/11/2008   Method used:   Electronically sent to ...       [facility] [address]*       [address] [place], [place]  [address]       Ph: [phone]       Fax: [phone]   RxID:   [id number]  ]

## 2010-11-25 NOTE — Progress Notes (Signed)
Summary: Office Visit//DEPRESSION SCREENING  Office Visit//DEPRESSION SCREENING   Imported By: Arta Bruce 07/29/2010 15:46:46  _____________________________________________________________________  External Attachment:    Type:   Image     Comment:   External Document

## 2010-11-25 NOTE — Progress Notes (Signed)
Summary: Needs OV in next 2 months and come fasting  Phone Note Outgoing Call   Summary of Call: CMA to contact PT. CMA may call in diazepam 5 mg Take 1 tab by mouth at bedtime as needed insomnia #30 with one refill as long as pt has scheduled appt to see Dr.Rankins and get his fasting labs.. (Pt has insomnia due to chronic prednisone use and nothing else worked for him...) Initial call taken by: Beverley Fiedler MD,  May 11, 2008 7:17 PM  Follow-up for Phone Call        patient said to let you know that he is getting blood work at his dermatologist, and he has an appt. on August 4th, and he wants to know if he'll have to get it here. patient is aware about his medicine being called in to [facility] next door. Follow-up by: Leodis Rains,  May 13, 2008 9:37 AM  Additional Follow-up for Phone Call Additional follow up Details #1::        rx called into pharmacy......................Marland KitchenMikey College CMA  May 15, 2008 12:01 PM       Prescriptions: DIAZEPAM 5 MG TABS (DIAZEPAM) 1 by mouth at bedtime as needed for insomnia  #30 x 1   Entered and Authorized by:   Beverley Fiedler MD   Signed by:   Beverley Fiedler MD on 05/11/2008   Method used:   Telephoned to ...       [facility] [address]*       [address] [place], [place]  [address]       Ph: [phone]       Fax: [phone]   RxID:   [id number]

## 2010-11-25 NOTE — Assessment & Plan Note (Signed)
Summary: 2236 PT/ 3 MONTH FU ON DEPRESSION & ASTHMA//KT   Vital Signs:  Patient profile:   40 year old male Weight:      207.56 pounds O2 Sat:      93 % on Room air Temp:     97.3 degrees F oral Pulse rate:   80 / minute Pulse rhythm:   regular Resp:     20 per minute BP sitting:   138 / 92  (left arm) Cuff size:   regular  Vitals Entered By: Hale Drone CMA (October 19, 2010 9:05 AM)  O2 Flow:  Room air CC: 3 month f/u on asthma, depression and sleep pattern.  Is Patient Diabetic? No Pain Assessment Patient in pain? no       Does patient need assistance? Functional Status Self care Ambulation Normal   Serial Vital Signs/Assessments:                                PEF    PreRx  PostRx Time      O2 Sat  O2 Type     L/min  L/min  L/min   By 9:11 AM   93  %   Room air                          [facility] CMA  Comments: 9:11 AM Peak Flow: 400 - 500 - 480 By: Hale Drone CMA    Primary Care Provider:  Tereso Newcomer PA-C  CC:  3 month f/u on asthma and depression and sleep pattern. .  History of Present Illness: 1.  Chronic Blepharitis:  following with Dr. Noel Gerold at ?Dr. Laruth Bouchard office for this.  On different eye drops.  States current status is usual appearance.  2.  Depression and anxiety:  no longer following with Aquilla Solian.  Taking Prozac 40 mg for depression.  Not sleeping well--runs out of Trazadone after 20 days.  Does better when starts taking again, then doubles up for a bit and sleeps better, then runs out and starts over.  On disability for ?atopia.  Lives with elderly father and basically runs the household.    Loves music--keyboardist.  Not doing much with it right now.  Watches a lot of TV and on computer much.    3.  Atopic dermatitis:  on MTX through Dr. Mayford Knife, Derm for this.    4.  Insomnia:  Still not sleeping well, see above regarding Trazodone.  Rare caffeine.  Little to no chocolate.  No real napping during day.  5.  Hyperglycemia:  diet  drinks only.  Has dropped 10 lbs since stopped.  Checks sugars occasionally.  Highest sugar he has checked was 107 since dropped sugary drinks.  6.  Borderline BP:  systolic better today,  Diastolic borderline still.  Is working on diet for weight loss  7.  Asthma:  Using Asmanex only once daily.  States using Singulair daily--has helped.  Still using rescue inhaler nightly--sounds like whether needs or not.  Has not smoked for over a year.  Did get flu shot at [facility] earlier this year.  Current Medications (verified): 1)  Folic Acid 400 Mcg Tabs (Folic Acid) .... Take 1 Tablet By Mouth Once A Day 2)  Methotrexate 2.5 Mg  Tabs (Methotrexate Sodium) .... Take 4 Tabs By Mouth Every Weeks 3)  Elidel 1% Cream + Hydrocortisone Valeate Crm0.2% -  50/50 Mix .... Apply To Rash Two Times A Day As Directed 4)  Hydroxyzine Pamoate 25 Mg Caps (Hydroxyzine Pamoate) .Marland Kitchen.. 1 To 4 By Mouth Once Daily As Needed 5)  Fish Oil   Oil (Fish Oil) .... 2 Capsules Per Day 6)  Doxycycline Hyclate 100 Mg Tabs (Doxycycline Hyclate) .... Take 1 Tablet By Mouth Once A Day 7)  Combivent 103-18 Mcg/act Aero (Ipratropium-Albuterol) .Marland Kitchen.. 1-2 Puffs Three To Four Times A Day 8)  Pepcid 20 Mg Tabs (Famotidine) .... Take 1 Tab By Mouth At Bedtime As Needed 9)  Clobetasol Propionate 0.05 % Crea (Clobetasol Propionate) .... As Directed 10)  Cromolyn Sodium 4 % Soln (Cromolyn Sodium) .... Eye Drops 4 Times A Day 11)  Fml 0.1 % Oint (Fluorometholone) .Marland Kitchen.. 1 Week On and 1 Week Off 12)  Refresh Tears 0.5 % Soln (Carboxymethylcellulose Sodium) .... As Directed 13)  Prozac 40 Mg Caps (Fluoxetine Hcl) .... Take 1 Tablet By Mouth Once A Day 14)  Trazodone Hcl 100 Mg Tabs (Trazodone Hcl) .... Take 1 Tab By Mouth At Bedtime As Needed For Sleep 15)  Asmanex 30 Metered Doses 220 Mcg/inh Aepb (Mometasone Furoate) .Marland Kitchen.. 1 Inhalation Two Times A Day.  Rinse Mouth Out Well After Each Use. (Dose Increase) 16)  Singulair 10 Mg Tabs (Montelukast  Sodium) .... Take 1 Tablet By Mouth Once A Day For Allergies and Asthma  Allergies (verified): 1)  ! * Peanuts  Physical Exam  General:  NAD Eyes:  Thickened flaking pink lid margins. Lungs:  Normal respiratory effort, chest expands symmetrically. Lungs are clear to auscultation, no crackles or wheezes. Heart:  Normal rate and regular rhythm. S1 and S2 normal without gallop, murmur, click, rub or other extra sounds.  Radial pulses normal and equal Abdomen:  Bowel sounds positive,abdomen soft and non-tender without masses, organomegaly or hernias noted.   Impression & Recommendations:  Problem # 1:  GLUCOSE INTOLERANCE (ICD-271.3) Has made some lifestyle changes Orders: Hemoglobin A1C (83036)  Problem # 2:  DEPRESSION (ICD-311) Discussed limiting TV and computer usage and doing more with music, volunteering, coming up with new hobbies, getting more physically active and getting outside --extended discussion for 20 minutes His updated medication list for this problem includes:    Hydroxyzine Pamoate 25 Mg Caps (Hydroxyzine pamoate) .Marland Kitchen... 1 to 4 by mouth once daily as needed    Prozac 40 Mg Caps (Fluoxetine hcl) .Marland Kitchen... Take 1 tablet by mouth once a day    Trazodone Hcl 100 Mg Tabs (Trazodone hcl) .Marland Kitchen... 1-2 tabs by mouth at bedtime for sleep  Problem # 3:  INSOMNIA (ICD-780.52) See depression Long discussion regarding improving sleep hygiene Needs Trazodone nightly --may use 100-200 mg  Problem # 4:  ANXIETY DISORDER, GENERALIZED (ICD-300.02) See depression His updated medication list for this problem includes:    Hydroxyzine Pamoate 25 Mg Caps (Hydroxyzine pamoate) .Marland Kitchen... 1 to 4 by mouth once daily as needed    Prozac 40 Mg Caps (Fluoxetine hcl) .Marland Kitchen... Take 1 tablet by mouth once a day    Trazodone Hcl 100 Mg Tabs (Trazodone hcl) .Marland Kitchen... 1-2 tabs by mouth at bedtime for sleep  Problem # 5:  ASTHMA (ICD-493.90) Up to date on immunizations Peak flow meter given--pt. states he has  asthma plan at home His updated medication list for this problem includes:    Combivent 103-18 Mcg/act Aero (Ipratropium-albuterol) .Marland Kitchen... 1-2 puffs three to four times a day    Asmanex 30 Metered Doses 220 Mcg/inh Aepb (Mometasone furoate) .Marland KitchenMarland KitchenMarland KitchenMarland Kitchen  1 inhalation two times a day.  rinse mouth out well after each use. (dose increase)    Singulair 10 Mg Tabs (Montelukast sodium) .Marland Kitchen... Take 1 tablet by mouth once a day for allergies and asthma  Orders: Peak Flow Rate ([address]) Pulse Oximetry (single measurment) (94760)  Complete Medication List: 1)  Folic Acid 400 Mcg Tabs (Folic acid) .... Take 1 tablet by mouth once a day 2)  Methotrexate 2.5 Mg Tabs (Methotrexate sodium) .... Take 4 tabs by mouth every weeks 3)  Elidel 1% Cream + Hydrocortisone Valeate Crm0.2% - 50/50 Mix  .... Apply to rash two times a day as directed 4)  Hydroxyzine Pamoate 25 Mg Caps (Hydroxyzine pamoate) .Marland Kitchen.. 1 to 4 by mouth once daily as needed 5)  Fish Oil Oil (Fish oil) .... 2 capsules per day 6)  Doxycycline Hyclate 100 Mg Tabs (Doxycycline hyclate) .... Take 1 tablet by mouth once a day 7)  Combivent 103-18 Mcg/act Aero (Ipratropium-albuterol) .Marland Kitchen.. 1-2 puffs three to four times a day 8)  Pepcid 20 Mg Tabs (Famotidine) .... Take 1 tab by mouth at bedtime as needed 9)  Clobetasol Propionate 0.05 % Crea (Clobetasol propionate) .... As directed 10)  Cromolyn Sodium 4 % Soln (Cromolyn sodium) .... Eye drops 4 times a day 11)  Fml 0.1 % Oint (Fluorometholone) .Marland KitchenMarland KitchenMarland Kitchen 1 week on and 1 week off 12)  Refresh Tears 0.5 % Soln (Carboxymethylcellulose sodium) .... As directed 13)  Prozac 40 Mg Caps (Fluoxetine hcl) .... Take 1 tablet by mouth once a day 14)  Trazodone Hcl 100 Mg Tabs (Trazodone hcl) .Marland Kitchen.. 1-2 tabs by mouth at bedtime for sleep 15)  Asmanex 30 Metered Doses 220 Mcg/inh Aepb (Mometasone furoate) .Marland Kitchen.. 1 inhalation two times a day.  rinse mouth out well after each use. (dose increase) 16)  Singulair 10 Mg Tabs (Montelukast  sodium) .... Take 1 tablet by mouth once a day for allergies and asthma   Patient Instructions: 1)  Use inhaler only when needed 2)  Regular bedtime and wake time.  No caffeine after lunch. 3)  Regular physical activity and indirect sun exposure 4)  GEt out of house every day and quit watching so much TV and computer 5)  Use Asmanex two times a day every day 6)  Follow up with Dr. Delrae Alfred in 4 months --asthma, insomnia, depression Prescriptions: SINGULAIR 10 MG TABS (MONTELUKAST SODIUM) Take 1 tablet by mouth once a day for allergies and asthma  #30 x 11   Entered and Authorized by:   Julieanne Manson MD   Signed by:   Julieanne Manson MD on 10/19/2010   Method used:   Electronically to        [facility] [address] [phone]* (retail)       [address]       [address]       [place], [place]  [address]       Ph: [phone]       Fax: [phone]   RxID:   [id number] ASMANEX 30 METERED DOSES 220 MCG/INH AEPB (MOMETASONE FUROATE) 1 inhalation two times a day.  Rinse mouth out well after each use. (dose increase)  #1 x 11   Entered and Authorized by:   Julieanne Manson MD   Signed by:   Julieanne Manson MD on 10/19/2010   Method used:   Electronically to        [facility] [address] [phone]* (retail)       [address]  [address]       [place], [place]  [address]       Ph: [phone]       Fax: [phone]   RxID:   [id number] PROZAC 40 MG CAPS (FLUOXETINE HCL) Take 1 tablet by mouth once a day  #30 x 6   Entered and Authorized by:   Julieanne Manson MD   Signed by:   Julieanne Manson MD on 10/19/2010   Method used:   Electronically to        [facility] [address] [phone]* (retail)       [address]       [address]       [place], [place]  [address]       Ph: [phone]       Fax: [phone]   RxID:   [id number] TRAZODONE HCL 100 MG TABS (TRAZODONE HCL) 1-2 tabs by mouth at bedtime for sleep  #60 x 6   Entered and Authorized by:    Julieanne Manson MD   Signed by:   Julieanne Manson MD on 10/19/2010   Method used:   Electronically to        [facility] [address] [phone]* (retail)       [address]       [address]       [place], [place]  [address]       Ph: [phone]       Fax: [phone]   RxID:   [id number]    Orders Added: 1)  Peak Flow Rate [94[address]] 2)  Pulse Oximetry (single measurment) [94760] 3)  Est. Patient Level IV [10626] 4)  Hemoglobin A1C [83036]   Immunization History:  Influenza Immunization History:    Influenza:  fluvax 3+ (07/24/2010)   Immunization History:  Influenza Immunization History:    Influenza:  Fluvax 3+ (07/24/2010)     Appended Document: A1C    Clinical Lists Changes  Orders: Added new Test order of T- Hemoglobin A1C ([address]-62703) - Signed Observations: Added new observation of HGBA1C: 5.6 % (10/19/2010 10:45)      Laboratory Results   Blood Tests   Date/Time Received: October 19, 2010 10:45 AM   HGBA1C: 5.6%   (Normal Range: Non-Diabetic - 3-6%   Control Diabetic - 6-8%)

## 2010-11-25 NOTE — Assessment & Plan Note (Signed)
Summary: check liver fuction/ discuss medications//gk   Vital Signs:  Patient profile:   40 year old male Height:      56.5 inches Weight:      210 pounds BMI:     46.42 Temp:     97.4 degrees F oral Pulse rate:   89 / minute Pulse rhythm:   regular Resp:     18 per minute BP sitting:   129 / 86  (left arm) Cuff size:   regular  Vitals Entered By: [name] (July 07, 2009 4:06 PM) CC: pt is here to check liver function wants to make sure meds is working properly...Marland KitchenMarland KitchenMarland Kitchen Is Patient Diabetic? No Pain Assessment Patient in pain? no       Does patient need assistance? Functional Status Self care Ambulation Normal Comments PF       1.   335            2.  350          3.   400   Primary Care Shayna Eblen:  Tereso Newcomer PA-C  CC:  pt is here to check liver function wants to make sure meds is working properly.......  History of Present Illness: Dermatologist:  Dr. Mayford Knife off [facility], [facility].   He gives patient Methotrexate.  Needs f/u labs.  Sees dermatologist tomorrow.  Will likely get labs there.    Asthma:  Using combivent now.  Has stopped Primatine Mist.  Seems like he is breathing better.  Using albuterol at night.  Still wheezing daily.  Wheezes less the less he smokes.  Has used asmanex and probably advair.  These meds made his symptoms worse.  Loratadine and singulair helped in the past.  Never had PFTs done.   Habits & Providers  Alcohol-Tobacco-Diet     Tobacco Status: current  Current Medications (verified): 1)  Folic Acid 1 Mg Tabs (Folic Acid) .Marland Kitchen.. 1 By Mouth Once Daily 2)  Methotrexate 2.5 Mg  Tabs (Methotrexate Sodium) .... Take 6 Tablets By Mouth .qweek Per Dermatologist 3)  Elidel 1% Cream .... Apply To Rash Two Times A Day 4)  Atarax 25 Mg .Marland Kitchen.. 1 By Mouth At Bedtime As Needed For Itching 5)  Albuterol 90 Mcg/act Aers (Albuterol) .... 2puffs Q4-6hrs Prn 6)  Fish Oil   Oil (Fish Oil) .... 2 Capsules Per Day 7)  Doxycycline Monohydrate 50 Mg  Tabs  (Doxycycline Monohydrate) .... Take 1 Tablet By Mouth Once A Day From Dr.gould(Chronic) 8)  Seroquel 25 Mg  Tabs (Quetiapine Fumarate) .... Take 1 Tab By Mouth At Bedtime 9)  Combivent 103-18 Mcg/act Aero (Ipratropium-Albuterol) .Marland Kitchen.. 1-2 Puffs Three To Four Times A Day 10)  Pepcid 20 Mg Tabs (Famotidine) .... Take 1 Tablet By Mouth Two Times A Day  Allergies (verified): 1)  ! * Peanuts  Past History:  Past Medical History: Last updated: 05/11/2009  BACK PAIN (ICD-724.5) DERMATITIS, OTHER ATOPIC (ICD-691.8)    a.  sees Dr. Mayford Knife (derm.) at [facility]. and now takes methotrexate (on med. x 2 years)    b.  liver bx. done last year due to methotrexate tx. ANXIETY DISORDER, GENERALIZED (ICD-300.02) BLEPHARITIS NOS (ICD-373.00)    a.  on doxy. 100mg  daily, cromolyn eye gtts,     b.  sees ophthalmology in [place] PNEUMONIA, HX OF (ICD-V12.60) ASTHMA (ICD-493.90) INSOMNIA...chronic. Has tried trazodone 3/06,ativan 5/06,ambien CR 6/06,lunesta 12/06. Was on diazepam 2008-8/09. Trial seroquel 8/09.  Physical Exam  General:  alert, well-developed, and well-nourished.   Head:  normocephalic and atraumatic.   Eyes:  blepharitis Neck:  supple.   Lungs:  normal breath sounds and no wheezes.   Heart:  normal rate and regular rhythm.   Extremities:  no edema Neurologic:  alert & oriented X3 and cranial nerves II-XII intact.   Psych:  normally interactive.     Impression & Recommendations:  Problem # 1:  ASTHMA (ICD-493.90)  improved with combivent somewhat check peak flows reschedule PFTs try loratadine - seemed to help in past may try singulair at some point as well - has helped in past d/c cigs  His updated medication list for this problem includes:    Albuterol 90 Mcg/act Aers (Albuterol) .Marland Kitchen... 2puffs q4-6hrs prn    Combivent 103-18 Mcg/act Aero (Ipratropium-albuterol) .Marland Kitchen... 1-2 puffs three to four times a day  Orders: PFT Baseline-Pre/Post Bronchodiolator (PFT  Baseline-Pre/Pos)  Problem # 2:  DERMATITIS, OTHER ATOPIC (ICD-691.8)  check labs for MTX  Orders: T-Comprehensive Metabolic Panel ([id number]) T-CBC w/Diff ([id number])  Complete Medication List: 1)  Folic Acid 1 Mg Tabs (Folic acid) .Marland Kitchen.. 1 by mouth once daily 2)  Methotrexate 2.5 Mg Tabs (Methotrexate sodium) .... Take 6 tablets by mouth .qweek per dermatologist 3)  Elidel 1% Cream  .... Apply to rash two times a day 4)  Atarax 25 Mg  .Marland KitchenMarland Kitchen. 1 by mouth at bedtime as needed for itching 5)  Albuterol 90 Mcg/act Aers (Albuterol) .... 2puffs q4-6hrs prn 6)  Fish Oil Oil (Fish oil) .... 2 capsules per day 7)  Doxycycline Monohydrate 50 Mg Tabs (Doxycycline monohydrate) .... Take 1 tablet by mouth once a day from dr.gould(chronic) 8)  Seroquel 25 Mg Tabs (Quetiapine fumarate) .... Take 1 tab by mouth at bedtime 9)  Combivent 103-18 Mcg/act Aero (Ipratropium-albuterol) .Marland Kitchen.. 1-2 puffs three to four times a day 10)  Pepcid 20 Mg Tabs (Famotidine) .... Take 1 tablet by mouth two times a day  Patient Instructions: 1)  Please schedule a follow-up appointment in 3 months for asthma.  2)  Tobacco is very bad for your health and your loved ones ! You should stop smoking !  3)  Stop smoking tips: Choose a quit date. Cut down before the quit date. Decide what you will do as a substitute when you feel the urge to smoke(gum, toothpick, exercise).  4)  Have patient sign a release of records form to get labs from his dermatologist. 5)  Patient can come by Thursday of this week to pick up copy of labs done today.

## 2010-11-25 NOTE — Progress Notes (Signed)
Summary: Cholesterol Discussion  Phone Note Outgoing Call   Call placed by: Beverley Fiedler MD,  August 21, 2008 2:06 PM Summary of Call: MD spoke to Pt about elevated lipids.Marland KitchenMarland KitchenHe is not hypertensive but he does smoke and LDL is 127 with TG 345(don't know if test was fasting).  Pt says he is getting a liver bx tomorrow per Dr.Turner's orders(DERM) b/c he's been on the MTX...his LFTs are fine. We discussed that I would like a copy of the report. Pt also states he eats lots of fried food/eggs/fast food and would like to try a lowfat/low cholesterol diet first for several months before consideration of a cholesterol medication. We also discussed the benefits of him getting FLU shots in the community. Initial call taken by: Beverley Fiedler MD,  August 21, 2008 2:16 PM

## 2010-11-25 NOTE — Progress Notes (Signed)
Summary: Office Visit//DEPRESSION SCREENING  Office Visit//DEPRESSION SCREENING   Imported By: Arta Bruce 07/05/2010 15:47:38  _____________________________________________________________________  External Attachment:    Type:   Image     Comment:   External Document

## 2010-11-25 NOTE — Progress Notes (Signed)
Summary: Increase Prozac  Phone Note Outgoing Call   Summary of Call: Spoke with Ethelene Browns.  Laroy Apple better mood wise at appt today.  He however is having trouble with sleeping.  Trazodone a little bit helpful at first.  Will try to increase prozac to 40 mg once daily. Initial call taken by: Tereso Newcomer PA-C,  June 18, 2010 9:20 AM    New/Updated Medications: PROZAC 40 MG CAPS (FLUOXETINE HCL) Take 1 tablet by mouth once a day Prescriptions: PROZAC 40 MG CAPS (FLUOXETINE HCL) Take 1 tablet by mouth once a day  #30 x 5   Entered and Authorized by:   Tereso Newcomer PA-C   Signed by:   Tereso Newcomer PA-C on 06/18/2010   Method used:   Print then Give to Patient   RxID:   [id number]     Impression & Recommendations:  Problem # 1:  DEPRESSION (ICD-311)  His updated medication list for this problem includes:    Hydroxyzine Pamoate 25 Mg Caps (Hydroxyzine pamoate) .Marland Kitchen... 1 to 4 by mouth once daily as needed    Prozac 40 Mg Caps (Fluoxetine hcl) .Marland Kitchen... Take 1 tablet by mouth once a day    Trazodone Hcl 50 Mg Tabs (Trazodone hcl) .Marland Kitchen... Take 1 tab by mouth at bedtime as needed for sleep  Complete Medication List: 1)  Folic Acid 400 Mcg Tabs (Folic acid) .... Take 1 tablet by mouth once a day 2)  Methotrexate 2.5 Mg Tabs (Methotrexate sodium) .... Take 4 tabs by mouth every weeks 3)  Elidel 1% Cream + Hydrocortisone Valeate Crm0.2% - 50/50 Mix  .... Apply to rash two times a day as directed 4)  Hydroxyzine Pamoate 25 Mg Caps (Hydroxyzine pamoate) .Marland Kitchen.. 1 to 4 by mouth once daily as needed 5)  Fish Oil Oil (Fish oil) .... 2 capsules per day 6)  Doxycycline Hyclate 100 Mg Tabs (Doxycycline hyclate) .... Take 1 tablet by mouth once a day 7)  Combivent 103-18 Mcg/act Aero (Ipratropium-albuterol) .Marland Kitchen.. 1-2 puffs three to four times a day 8)  Pepcid 20 Mg Tabs (Famotidine) .... Take 1 tab by mouth at bedtime as needed 9)  Clobetasol Propionate 0.05 % Crea (Clobetasol propionate)  .... As directed 10)  Cromolyn Sodium 4 % Soln (Cromolyn sodium) .... Eye drops 4 times a day 11)  Fml 0.1 % Oint (Fluorometholone) .Marland KitchenMarland KitchenMarland Kitchen 1 week on and 1 week off 12)  Refresh Tears 0.5 % Soln (Carboxymethylcellulose sodium) .... As directed 13)  Prozac 40 Mg Caps (Fluoxetine hcl) .... Take 1 tablet by mouth once a day 14)  Trazodone Hcl 50 Mg Tabs (Trazodone hcl) .... Take 1 tab by mouth at bedtime as needed for sleep 15)  Asmanex 30 Metered Doses 220 Mcg/inh Aepb (Mometasone furoate) .Marland Kitchen.. 1 inhalation at bedtime; rinse mouth out well after each use.

## 2010-11-25 NOTE — Progress Notes (Signed)
Summary: TEST RESULTS  Phone Note Call from Patient   Summary of Call: [facility] PT. CALLING TO SEE IF WE HAVE HIS ASTHMA TEST RESULTS AND WHAT IT SAID. Initial call taken by: Leodis Rains,  October 12, 2007 10:27 AM  Follow-up for Phone Call        advised pt that we would need to call to PFT lab for results and once results come in and provider reviews it someone will give him a call. Follow-up by: Mikey College CMA,  October 12, 2007 12:03 PM  Additional Follow-up for Phone Call Additional follow up Details #1::        waiting on PFT lab to fax results Additional Follow-up by: Mikey College CMA,  October 24, 2007 3:48 PM    Additional Follow-up for Phone Call Additional follow up Details #2::    Results on providers desk!! Follow-up by: Mikey College CMA,  October 29, 2007 4:19 PM  Additional Follow-up for Phone Call Additional follow up Details #3:: Details for Additional Follow-up Action Taken: MD d/w pt that PFT's were good and showed only minimal airway instruction without response to bronchodilator. Pt still feels that he needs to use OTC primatene mist despite the recommendation of this MD. He has been using the primatene mist for over 20 years and emotional dependence may be an issue. He is willing to see an allergist now that he has medicaid. MD asked that he document how often he is using the primatene and what situations prompt him to need to use it.Marland KitchenMarland KitchenReferral done in EMR Additional Follow-up by: Beverley Fiedler MD,  November 02, 2007 3:38 PM

## 2010-11-25 NOTE — Letter (Signed)
Summary: [facility]/NO SHOWED   DERMATOLOGY/NO SHOWED   Imported By: Arta Bruce 11/20/2008 10:50:40  _____________________________________________________________________  External Attachment:    Type:   Image     Comment:   External Document

## 2010-11-25 NOTE — Assessment & Plan Note (Signed)
Summary: CONSULTATION W/NEW PROVIDER///RJP   Vital Signs:  Patient profile:   40 year old male Height:      56.50 inches Weight:      212 pounds Temp:     97.3 degrees F oral Pulse rate:   89 / minute Pulse rhythm:   regular Resp:     20 per minute BP sitting:   115 / 80  (left arm) Cuff size:   regular  Vitals Entered By: [name] (May 11, 2009 2:57 PM) CC: pt here to meet new provider.... Is Patient Diabetic? No Pain Assessment Patient in pain? yes     Location: back Intensity: 4-5 Type: aching Onset of pain  Intermittent  Does patient need assistance? Functional Status Self care Ambulation Normal   CC:  pt here to meet new provider.....  History of Present Illness: Here for f/u.  This is my first meeting with the patient.  C/o insomnia, which is chronic.  Tried valium, ambien, etc. in past.  Nothing really helped.   Plays keyboards.  No suicidal ideations.  Eating ok.  Able to take care of self.  Good concentration.  Asthma.  Breathing fairly well controlled.  Wheezes most every day.  Still using primatine mist.  Has used combivent in past with good results.  No dyspnea with exertion.  Seldom coughs.  No hemoptysis.  No orthopnea.  No PND.  No edema.  No chest pain.  No syncope.  Has seen allergist in past.  Notes say he was placed on flovent.  Patient notes he did worse on the meds he took from the allergist.   Pos. water brash symptoms.  No dysphagia.  No indigestion.    Habits & Providers  Alcohol-Tobacco-Diet     Tobacco Status: current  Exercise-Depression-Behavior     Drug Use: no  Allergies: 1)  ! * Peanuts  Past History:  Past Medical History:  BACK PAIN (ICD-724.5) DERMATITIS, OTHER ATOPIC (ICD-691.8)    a.  sees Dr. Mayford Knife (derm.) at [facility]. and now takes methotrexate (on med. x 2 years)    b.  liver bx. done last year due to methotrexate tx. ANXIETY DISORDER, GENERALIZED (ICD-300.02) BLEPHARITIS NOS (ICD-373.00)    a.  on doxy.  100mg  daily, cromolyn eye gtts,     b.  sees ophthalmology in [place] PNEUMONIA, HX OF (ICD-V12.60) ASTHMA (ICD-493.90) INSOMNIA...chronic. Has tried trazodone 3/06,ativan 5/06,ambien CR 6/06,lunesta 12/06. Was on diazepam 2008-8/09. Trial seroquel 8/09.  Past Surgical History: liver biopsy 2009  Family History: DM - mother and diabetes  Social History: Current Smoker (1ppd since age 26) Alcohol use-no Drug use-no Disabled from atopic dermatitis Single 1 daughter (lives with mother)  Physical Exam  General:  alert, well-developed, and well-nourished.   Head:  normocephalic and atraumatic.   Eyes:  pupils equal, pupils round, and pupils reactive to light.   Nose:  no external deformity.   Mouth:  no erythema, no exudates, and fair dentition.   Neck:  supple.   Lungs:  normal respiratory effort, no crackles, and no wheezes.   Heart:  normal rate, regular rhythm, and no murmur.   Abdomen:  soft and non-tender.   Msk:  normal ROM.   Neurologic:  alert & oriented X3 and cranial nerves II-XII intact.   Skin:  dishyrosis noted around eyelids with assoc. erythema Psych:  normally interactive.     Impression & Recommendations:  Problem # 1:  ASTHMA (ICD-493.90)  Question if he has some increased acid contributing  to his wheezing.  Sx's worse with lying flat.  Will try pepcid.  Also, encouraged him to stop the primatine mist and go back on combivent. Discussed with Dr. Reche Dixon.  Will order PFTs to compare with 2008 numbers.   Encouraged him to stop smoking. His updated medication list for this problem includes:    Albuterol 90 Mcg/act Aers (Albuterol) .Marland Kitchen... 2puffs q4-6hrs prn    Combivent 103-18 Mcg/act Aero (Ipratropium-albuterol) .Marland Kitchen... 1-2 puffs three to four times a day  Orders: PFT Baseline-Pre/Post Bronchodiolator (PFT Baseline-Pre/Pos)  Problem # 2:  DERMATITIS, OTHER ATOPIC (ICD-691.8) He has labs drawn be dermatologist every 4 mos.  Will request labs from derm. before we  do any routine lab draws.  He continues on methotrexate with dermatology.  Complete Medication List: 1)  Folic Acid 1 Mg Tabs (Folic acid) .Marland Kitchen.. 1 by mouth once daily 2)  Methotrexate 2.5 Mg Tabs (Methotrexate sodium) .... Take 6 tablets by mouth .qweek per dermatologist 3)  Elidel 1% Cream  .... Apply to rash two times a day 4)  Atarax 25 Mg  .Marland KitchenMarland Kitchen. 1 by mouth at bedtime as needed for itching 5)  Albuterol 90 Mcg/act Aers (Albuterol) .... 2puffs q4-6hrs prn 6)  Fish Oil Oil (Fish oil) .... 2 capsules per day 7)  Doxycycline Monohydrate 50 Mg Tabs (Doxycycline monohydrate) .... Take 1 tablet by mouth once a day from dr.gould(chronic) 8)  Seroquel 25 Mg Tabs (Quetiapine fumarate) .... Take 1 tab by mouth at bedtime 9)  Combivent 103-18 Mcg/act Aero (Ipratropium-albuterol) .Marland Kitchen.. 1-2 puffs three to four times a day 10)  Pepcid 20 Mg Tabs (Famotidine) .... Take 1 tablet by mouth two times a day  Patient Instructions: 1)  Stop using Primatine Mist. 2)  Start using combivent. 3)  Start using pepcid and take twice a day, everyday for now. 4)  Sign a release to get your recent lab work from the dermatologist. 5)  Please schedule a follow-up appointment in 6 weeks for asthma .  6)  Tobacco is very bad for your health and your loved ones ! You should stop smoking !  Prescriptions: COMBIVENT 103-18 MCG/ACT AERO (IPRATROPIUM-ALBUTEROL) 1-2 puffs three to four times a day  #1 x 3   Entered and Authorized by:   Tereso Newcomer PA-C   Signed by:   Tereso Newcomer PA-C on 05/11/2009   Method used:   Print then Give to Patient   RxID:   [id number] PEPCID 20 MG TABS (FAMOTIDINE) Take 1 tablet by mouth two times a day  #30 x 2   Entered and Authorized by:   Tereso Newcomer PA-C   Signed by:   Tereso Newcomer PA-C on 05/11/2009   Method used:   Print then Give to Patient   RxID:   [id number] COMBIVENT 103-18 MCG/ACT AERO (IPRATROPIUM-ALBUTEROL) 1-2 puffs four times a day as needed  #1 x 2   Entered and  Authorized by:   Tereso Newcomer PA-C   Signed by:   Tereso Newcomer PA-C on 05/11/2009   Method used:   Print then Give to Patient   RxID:   [id number]

## 2010-11-25 NOTE — Letter (Signed)
Summary: influenza vaccination  influenza vaccination   Imported By: Arta Bruce 08/22/2007 16:06:36  _____________________________________________________________________  External Attachment:    Type:   Image     Comment:   External Document

## 2010-11-25 NOTE — Progress Notes (Signed)
  Phone Note Other Incoming   Reason for Call: Discuss lab or test results Summary of Call: Please let Adam Lozano know that his lab tests were ok and he can come by to pick up a copy to take to his dermatologist. Initial call taken by: Brynda Rim,  July 08, 2009 8:36 AM  Follow-up for Phone Call        tried calling pt but no answer ....Marland KitchenMarland KitchenArmenia Shannon  July 10, 2009 8:29 AM  Left message on answering machine for pt to call back...Marland KitchenMarland KitchenArmenia Shannon  July 10, 2009 1:55 PM

## 2010-11-25 NOTE — Letter (Signed)
Summary: RECORDS THRU [facility]/DR. TURNER  RECORDS THRU  DERMATOLOGY/DR. TURNER   Imported By: Arta Bruce 07/27/2009 11:16:43  _____________________________________________________________________  External Attachment:    Type:   Image     Comment:   External Document

## 2010-11-25 NOTE — Letter (Signed)
Summary: REQUESTING RECORDS FROM [facility]  REQUESTING RECORDS FROM [facility]   Imported By: Arta Bruce 07/16/2009 11:29:09  _____________________________________________________________________  External Attachment:    Type:   Image     Comment:   External Document

## 2010-11-25 NOTE — Progress Notes (Signed)
  Phone Note Call from Patient   Summary of Call: Pt call to return a call from 11-19-07 please call him back  [phone]   Initial call taken by: Cheryll Dessert,  November 20, 2007 3:00 PM  Follow-up for Phone Call        pt aware of appt Follow-up by: Vesta Mixer CMA,  November 20, 2007 4:16 PM

## 2010-11-25 NOTE — Progress Notes (Signed)
Summary: RF albuterol MDI  Phone Note Call from Patient   Summary of Call: Pt could not remember if he told you which pharmacy he wanted his albuterol called to.  [facility] Initial call taken by: Vesta Mixer CMA,  August 22, 2007 4:34 PM  Follow-up for Phone Call        On 10/21,MD printed off script for pt to pick-up but pt did not get when here at office on 10/28. MD sent script electronically to [facility]. MD left message on machine for pt that med sent to [facility] Follow-up by: Beverley Fiedler MD,  August 23, 2007 8:45 AM      Prescriptions: ALBUTEROL 90 MCG/ACT AERS (ALBUTEROL) 2puffs q4-6hrs prn  #1 x 0   Entered and Authorized by:   Beverley Fiedler MD   Signed by:   Beverley Fiedler MD on 08/23/2007   Method used:   Electronically sent to ...       [facility] [address]       [address] [place], [place]  [address]       Ph: [phone]       Fax: [phone]   RxID:   [id number]

## 2010-11-25 NOTE — Letter (Signed)
Summary: LAB ORDER  LAB ORDER   Imported By: Arta Bruce 08/20/2010 11:31:02  _____________________________________________________________________  External Attachment:    Type:   Image     Comment:   External Document

## 2010-11-25 NOTE — Letter (Signed)
Summary: PFT RESULTS   PFT RESULTS   Imported By: Arta Bruce 11/07/2007 16:11:05  _____________________________________________________________________  External Attachment:    Type:   Image     Comment:   External Document

## 2010-11-25 NOTE — Assessment & Plan Note (Signed)
Summary: ASTHMA FU AND BLOOD WORK//KT   Vital Signs:  Patient Profile:   40 Years Old Male Weight:      208 pounds Temp:     97.4 degrees F oral Pulse rate:   88 / minute Pulse rhythm:   regular Resp:     20 per minute BP sitting:   140 / 80  (right arm)  Pt. in pain?   no  Vitals Entered By: Mikey College CMA (August 22, 2007 10:26 AM)              Is Patient Diabetic? No  Does patient need assistance? Functional Status Self care Ambulation Normal Comments pharmacy: [facility] [address]      Chief Complaint:  PT her f/u asthma and c/o having alot of anxiety.Marland Kitchen  History of Present Illness: He is here at MD request for f/u on chronic labs on methotrexate. Pt says that Dr.Turner has told him that he can do labs every 3-4 months instead of every 2 months. Pt says his next appt with Dr. Mayford Knife is the first week of December.  He is requesting a combivent MDI as he can no longer get the OTC primatene mist inhaler(which this MD has told him not to use on multiple prior occasions). MD again d/w Pt that primatene mist is not treatment for asthma, especially for rescue breathing. Pt does not feel that albuterol helps him. In past, he declined baseline PFTs because of cost but he now has medicaid and is aggreable to getting the test. I d/w him that pending the results of his PFTs, he is to use the albuterol MDI as needed.     Current Allergies: ! * PEANUTS  Past Medical History:    Current Problems:     FOLLOW-UP, HIGH RISK TREATMENT NEC (ICD-V67.51)    STEROID USE, LONG TERM (ICD-V58.65)    BACK PAIN (ICD-724.5)    DERMATITIS, OTHER ATOPIC (ICD-691.8)    ANXIETY DISORDER, GENERALIZED (ICD-300.02)    BLEPHARITIS NOS (ICD-373.00)    PNEUMONIA, HX OF (ICD-V12.60)    ASTHMA (ICD-493.90)            Risk Factors:    Review of Systems      See HPI   Physical Exam  General:     alert, well-developed, and appropriate dress.   Has chronic blepharitis and no eyelashes.  Has severe atopic dermatitis which has improved substantially on methotrexate Lungs:     Normal respiratory effort, chest expands symmetrically. Lungs are clear to auscultation, no crackles or wheezes. Heart:     Normal rate and regular rhythm. S1 and S2 normal without gallop, murmur, click, rub or other extra sounds.    Impression & Recommendations:  Problem # 1:  DERMATITIS, OTHER ATOPIC (ICD-691.8) Has f/u with his Dermatologist,Dr.Turner, the  first week of Dec. MD d/w patient that I will need a note from Dr.Turner if he has approved broadening the intervals between required labwork(on methotrexate). Continue methotrexate per Dr.Turner( pt says now taking 2.5 mg tabs at 5 tabs qSunday) Orders: T-Comprehensive Metabolic Panel ([id number]) T-CBC w/Diff ([id number])   Problem # 2:  ANXIETY DISORDER, GENERALIZED (ICD-300.02) Pt has not been using the diazepam and will refill for him to retry. His updated medication list for this problem includes:    Diazepam 5 Mg Tabs (Diazepam) .Marland Kitchen... 1 by mouth at bedtime as needed for insomnia   Problem # 3:  ASTHMA (ICD-493.90) Again d/w Pt that use of primatene mist is not  recommended. Pt now agreeable to getting PFTs His updated medication list for this problem includes:    Albuterol 90 Mcg/act Aers (Albuterol) .Marland Kitchen... 2puffs q4-6hrs prn  Orders: PFT Baseline-Pre/Post Bronchodiolator (PFT Baseline-Pre/Pos)   Complete Medication List: 1)  Folic Acid 1 Mg Tabs (Folic acid) .Marland Kitchen.. 1 by mouth once daily 2)  Methotrexate 2.5mg   .... Take 5 tablets po each sunday 3)  Elidel 1% Cream  .... Apply to rash two times a day 4)  Diazepam 5 Mg Tabs (Diazepam) .Marland Kitchen.. 1 by mouth at bedtime as needed for insomnia 5)  Atarax 25 Mg  .Marland KitchenMarland Kitchen. 1 by mouth at bedtime as needed for itching 6)  Albuterol 90 Mcg/act Aers (Albuterol) .... 2puffs q4-6hrs prn 7)  Fish Oil Oil (Fish oil) .... 2 capsules per day  Other Orders: Flu Vaccine 69yrs + (40347) Admin 1st Vaccine  (42595)   Patient Instructions: 1)  Please schedule a follow-up appointment in 2 months for labs unless bring in note from dr.Turner.    Prescriptions: DIAZEPAM 5 MG TABS (DIAZEPAM) 1 by mouth at bedtime as needed for insomnia  #30 x 1   Entered and Authorized by:   Beverley Fiedler MD   Signed by:   Beverley Fiedler MD on 08/22/2007   Method used:   Print then Give to Patient   RxID:   [id number]  ]  Influenza Vaccine    Vaccine Type: Fluvax 3+    Site: right deltoid    Mfr: Sanofi Pasteur    Dose: 0.5 ml    Route: [place]    Given by: Mikey College CMA    Exp. Date: 04/23/2007    Lot #: [id number]  Flu Vaccine Consent Questions    Do you have a history of severe allergic reactions to this vaccine? no    Any prior history of allergic reactions to egg and/or gelatin? no    Do you have a sensitivity to the preservative Thimersol? no    Do you have a past history of Guillan-Barre Syndrome? no    Do you currently have an acute febrile illness? no    Have you ever had a severe reaction to latex? yes    Vaccine information given and explained to patient? no   Appended Document: ASTHMA FU AND BLOOD WORK//KT PF meter values: 166,063,016 HgA1c =5.8

## 2010-11-25 NOTE — Miscellaneous (Signed)
Summary: Allergy & ASTHMA/DR.BARDELAS  Allergy & ASTHMA/DR.BARDELAS   Imported By: Arta Bruce 02/06/2008 15:43:22  _____________________________________________________________________  External Attachment:    Type:   Image     Comment:   External Document

## 2010-11-25 NOTE — Miscellaneous (Signed)
Summary: Asthma and Allergy notes reviewed  Seen by Dr. Tonette Bihari at [facility]. Flovent 220 2 puffs once daily added. Patient told to look into Xolair.  Clinical Lists Changes  Medications: Added new medication of FLOVENT HFA 220 MCG/ACT AERO (FLUTICASONE PROPIONATE  HFA) 2 puffs once daily (Dr. Beaulah Dinning)

## 2010-11-25 NOTE — Progress Notes (Signed)
Summary: NEEDS MEDS AND AN APPT  Phone Note Call from Patient   Summary of Call: Adam Lozano Age PT. TRYING TO GET AN APPT. TO BE SEEN, BECAUSE THEY HAVE PULLED PRIMATINE MIST OFF OF THE SHELVES AND NOW HE'LL BE NEEDING A SCRIPT FOR COMBIVENT BECAUSE HE WONT HAVE ANYTHING TO TAKE. Initial call taken by: Leodis Rains,  August 08, 2007 9:09 AM  Follow-up for Phone Call        I have counseled this patient on several occasions that he should NOT be using Primatene Mist for breathing problems. I believe that he is due for a lab visit to check his labs   Please make him a NURSE visit this week for labs and for screen of lungs/peak flows. I will print off script for albuterol MDI and he can pick it up when he comes for his labs. Follow-up by: Beverley Fiedler MD,  August 14, 2007 5:20 PM      Prescriptions: ALBUTEROL 90 MCG/ACT AERS (ALBUTEROL) 2puffs q4-6hrs prn  #1 x 0   Entered and Authorized by:   Beverley Fiedler MD   Signed by:   Beverley Fiedler MD on 08/14/2007   Method used:   Print then Give to Patient   RxID:   [id number]

## 2010-11-25 NOTE — Letter (Signed)
Summary: MAILED REQUESTED RECORDS TO [facility]  MAILED REQUESTED RECORDS TO [facility]   Imported By: Arta Bruce 10/01/2010 17:00:11  _____________________________________________________________________  External Attachment:    Type:   Image     Comment:   External Document

## 2010-11-25 NOTE — Assessment & Plan Note (Signed)
Summary: HE SAYS ROUTINE VISIT//KT/PT LEFT W/O SEEING MD   Vital Signs:  Patient Profile:   40 Years Old Male Height:     56.50 inches Weight:      211 pounds BMI:     46.64 BSA:     1.83 Temp:     97.7 degrees F oral Pulse rate:   72 / minute Pulse rhythm:   regular Resp:     18 per minute BP sitting:   130 / 82  (left arm) Cuff size:   regular  Pt. in pain?   no  Vitals Entered By: [name] (December 22, 2008 10:14 AM)              Is Patient Diabetic? No     Chief Complaint:  pt says he is here for a routine visit and pt would like some med to help him sleep...Marland KitchenMarland KitchenMarland Kitchen PT LEFT WITHOUT SEEING MD....    Prior Medications Reviewed Using: Patient Recall  Current Allergies (reviewed today): ! * PEANUTS        Complete Medication List: 1)  Folic Acid 1 Mg Tabs (Folic acid) .Marland Kitchen.. 1 by mouth once daily 2)  Methotrexate 2.5 Mg Tabs (Methotrexate sodium) .... Take 6 tablets by mouth .qweek per dermatologist 3)  Elidel 1% Cream  .... Apply to rash two times a day 4)  Atarax 25 Mg  .Marland KitchenMarland Kitchen. 1 by mouth at bedtime as needed for itching 5)  Albuterol 90 Mcg/act Aers (Albuterol) .... 2puffs q4-6hrs prn 6)  Fish Oil Oil (Fish oil) .... 2 capsules per day 7)  Doxycycline Monohydrate 50 Mg Tabs (Doxycycline monohydrate) .... Take 1 tablet by mouth once a day from dr.gould(chronic) 8)  Seroquel 25 Mg Tabs (Quetiapine fumarate) .... Take 1 tab by mouth at bedtime

## 2010-11-25 NOTE — Letter (Signed)
Summary: nutrition/susie  nutrition/susie   Imported By: Arta Bruce 09/08/2010 10:16:44  _____________________________________________________________________  External Attachment:    Type:   Image     Comment:   External Document

## 2010-11-25 NOTE — Assessment & Plan Note (Signed)
Summary: Depression and Asthma   Vital Signs:  Patient profile:   40 year old male Height:      56.5 inches Weight:      216.2 pounds BMI:     47.79 Temp:     96.9 degrees F oral Pulse rate:   80 / minute Pulse rhythm:   regular Resp:     23 per minute BP sitting:   145 / 80  (left arm) Cuff size:   regular  Vitals Entered By: [name] (July 20, 2010 9:17 AM)  Serial Vital Signs/Assessments:  Time      Position  BP       Pulse  Resp  Temp     By 10:04 AM            130/80                         Tereso Newcomer PA-C  CC: 6-8 wk f/u....., Hypertension Management Is Patient Diabetic? Yes Pain Assessment Patient in pain? no      CBG Result 147  Does patient need assistance? Functional Status Self care Ambulation Normal Comments pf 1.   260     2.   330       3.  360   Primary Care Provider:  Tereso Newcomer PA-C  CC:  6-8 wk f/u..... and Hypertension Management.  History of Present Illness: Here for f/u.   Asthma better controlled.  See below.  Still using rescue inhaler a lot.  Thinks out of habit.  Has used singulair in past with good results. Depression:  Improving.  Still seeing Marchelle Folks.  Recently increased prozac and trazodone.  See A&P.  Asthma History    Asthma Control Assessment:    Age range: 12+ years    Symptoms: >2 days/week    Nighttime Awakenings: 1-3/week    SABA use (not for EIB): >2 days/week    Asthma Control Assessment: Not Well Controlled  Hypertension History:      Positive major cardiovascular risk factors include current tobacco user.  Negative major cardiovascular risk factors include male age less than 17 years old.      Problems Prior to Update: 1)  Glucose Intolerance  (ICD-271.3) 2)  Restless Legs Syndrome  (ICD-333.94) 3)  Depression  (ICD-311) 4)  F/u Exam Follow Cmpl Tx W/high-risk Med Nec  (ICD-V67.51) 5)  Cholesterol, Screening  (ICD-V77.91) 6)  Insomnia  (ICD-780.52) 7)  Follow-up, High Risk Treatment Nec   (ICD-V67.51) 8)  Ekg  () 9)  Steroid Use, Long Term  (ICD-V58.65) 10)  Back Pain  (ICD-724.5) 11)  Dermatitis, Other Atopic  (ICD-691.8) 12)  Anxiety Disorder, Generalized  (ICD-300.02) 13)  Blepharitis Nos  (ICD-373.00) 14)  Pneumonia, Hx of  (ICD-V12.60) 15)  Asthma  (ICD-493.90)  Current Medications (verified): 1)  Folic Acid 400 Mcg Tabs (Folic Acid) .... Take 1 Tablet By Mouth Once A Day 2)  Methotrexate 2.5 Mg  Tabs (Methotrexate Sodium) .... Take 4 Tabs By Mouth Every Weeks 3)  Elidel 1% Cream + Hydrocortisone Valeate Crm0.2% - 50/50 Mix .... Apply To Rash Two Times A Day As Directed 4)  Hydroxyzine Pamoate 25 Mg Caps (Hydroxyzine Pamoate) .Marland Kitchen.. 1 To 4 By Mouth Once Daily As Needed 5)  Fish Oil   Oil (Fish Oil) .... 2 Capsules Per Day 6)  Doxycycline Hyclate 100 Mg Tabs (Doxycycline Hyclate) .... Take 1 Tablet By Mouth Once A Day 7)  Combivent 103-18  Mcg/act Aero (Ipratropium-Albuterol) .Marland Kitchen.. 1-2 Puffs Three To Four Times A Day 8)  Pepcid 20 Mg Tabs (Famotidine) .... Take 1 Tab By Mouth At Bedtime As Needed 9)  Clobetasol Propionate 0.05 % Crea (Clobetasol Propionate) .... As Directed 10)  Cromolyn Sodium 4 % Soln (Cromolyn Sodium) .... Eye Drops 4 Times A Day 11)  Fml 0.1 % Oint (Fluorometholone) .Marland Kitchen.. 1 Week On and 1 Week Off 12)  Refresh Tears 0.5 % Soln (Carboxymethylcellulose Sodium) .... As Directed 13)  Prozac 40 Mg Caps (Fluoxetine Hcl) .... Take 1 Tablet By Mouth Once A Day 14)  Trazodone Hcl 100 Mg Tabs (Trazodone Hcl) .... Take 1 Tab By Mouth At Bedtime As Needed For Sleep 15)  Asmanex 30 Metered Doses 220 Mcg/inh Aepb (Mometasone Furoate) .Marland Kitchen.. 1 Inhalation At Bedtime; Rinse Mouth Out Well After Each Use.  Allergies (verified): 1)  ! * Peanuts  Physical Exam  General:  alert, well-developed, and well-nourished.   Head:  normocephalic and atraumatic.   Neck:  supple.   Lungs:  normal breath sounds and no wheezes.   Heart:  normal rate and regular rhythm.     Extremities:  no edema  Neurologic:  alert & oriented X3 and cranial nerves II-XII intact.   Psych:  normally interactive.     Impression & Recommendations:  Problem # 1:  DEPRESSION (ICD-311) PHQ9=10 . . .basically unchanged mood seems better overall does not tend to get mad as often . . . no suicidal thoughts no crying spells has a lot of pressure at home . . . Dad recently had CABG . . . mom had TIA. . . grandmother has Alzheimer's  has recently increased prozac and trazodone for sleep still seeing counselor Marchelle Folks) rec he continue with current RX  will see how higher dose and continued counseling helps  His updated medication list for this problem includes:    Hydroxyzine Pamoate 25 Mg Caps (Hydroxyzine pamoate) .Marland Kitchen... 1 to 4 by mouth once daily as needed    Prozac 40 Mg Caps (Fluoxetine hcl) .Marland Kitchen... Take 1 tablet by mouth once a day    Trazodone Hcl 100 Mg Tabs (Trazodone hcl) .Marland Kitchen... Take 1 tab by mouth at bedtime as needed for sleep  Problem # 2:  ASTHMA (ICD-493.90) has been using Asmanex two times a day most of the time will go ahead and increase to two times a day permanently overall feels much better has been quit smoking for a year thinks he uses rescue inhaler out of habit has used singulair in past with good results will add this  His updated medication list for this problem includes:    Combivent 103-18 Mcg/act Aero (Ipratropium-albuterol) .Marland Kitchen... 1-2 puffs three to four times a day    Asmanex 30 Metered Doses 220 Mcg/inh Aepb (Mometasone furoate) .Marland Kitchen... 1 inhalation two times a day.  rinse mouth out well after each use. (dose increase)    Singulair 10 Mg Tabs (Montelukast sodium) .Marland Kitchen... Take 1 tablet by mouth once a day for allergies and asthma  Problem # 3:  GLUCOSE INTOLERANCE (ICD-271.3)  cont to follow sugars BP borderline  . . . will need to continue to follow I would not put him on an ACE due to his issues with atopy would suggest an ARB if BP remains  elevated  Orders: Capillary Blood Glucose/CBG (40102)  Complete Medication List: 1)  Folic Acid 400 Mcg Tabs (Folic acid) .... Take 1 tablet by mouth once a day 2)  Methotrexate 2.5  Mg Tabs (Methotrexate sodium) .... Take 4 tabs by mouth every weeks 3)  Elidel 1% Cream + Hydrocortisone Valeate Crm0.2% - 50/50 Mix  .... Apply to rash two times a day as directed 4)  Hydroxyzine Pamoate 25 Mg Caps (Hydroxyzine pamoate) .Marland Kitchen.. 1 to 4 by mouth once daily as needed 5)  Fish Oil Oil (Fish oil) .... 2 capsules per day 6)  Doxycycline Hyclate 100 Mg Tabs (Doxycycline hyclate) .... Take 1 tablet by mouth once a day 7)  Combivent 103-18 Mcg/act Aero (Ipratropium-albuterol) .Marland Kitchen.. 1-2 puffs three to four times a day 8)  Pepcid 20 Mg Tabs (Famotidine) .... Take 1 tab by mouth at bedtime as needed 9)  Clobetasol Propionate 0.05 % Crea (Clobetasol propionate) .... As directed 10)  Cromolyn Sodium 4 % Soln (Cromolyn sodium) .... Eye drops 4 times a day 11)  Fml 0.1 % Oint (Fluorometholone) .Marland KitchenMarland KitchenMarland Kitchen 1 week on and 1 week off 12)  Refresh Tears 0.5 % Soln (Carboxymethylcellulose sodium) .... As directed 13)  Prozac 40 Mg Caps (Fluoxetine hcl) .... Take 1 tablet by mouth once a day 14)  Trazodone Hcl 100 Mg Tabs (Trazodone hcl) .... Take 1 tab by mouth at bedtime as needed for sleep 15)  Asmanex 30 Metered Doses 220 Mcg/inh Aepb (Mometasone furoate) .Marland Kitchen.. 1 inhalation two times a day.  rinse mouth out well after each use. (dose increase) 16)  Singulair 10 Mg Tabs (Montelukast sodium) .... Take 1 tablet by mouth once a day for allergies and asthma  Hypertension Assessment/Plan:      The patient's hypertensive risk group is category B: At least one risk factor (excluding diabetes) with no target organ damage.  His calculated 10 year risk of coronary heart disease is 7 %.  Today's blood pressure is 145/80.    Patient Instructions: 1)  Please schedule a follow-up appointment in 3 months for depression and Asthma. 2)   Schedule appt with Susie Piper for glucose intolerance. 3)  Continue follow up with Ethelene Browns.  Prescriptions: SINGULAIR 10 MG TABS (MONTELUKAST SODIUM) Take 1 tablet by mouth once a day for allergies and asthma  #30 x 5   Entered and Authorized by:   Tereso Newcomer PA-C   Signed by:   Tereso Newcomer PA-C on 07/20/2010   Method used:   Print then Give to Patient   RxID:   [id number] ASMANEX 30 METERED DOSES 220 MCG/INH AEPB (MOMETASONE FUROATE) 1 inhalation two times a day.  Rinse mouth out well after each use. (dose increase)  #1 x 11   Entered and Authorized by:   Tereso Newcomer PA-C   Signed by:   Tereso Newcomer PA-C on 07/20/2010   Method used:   Print then Give to Patient   RxID:   [id number]

## 2010-11-25 NOTE — Letter (Signed)
Summary: [facility]   [facility]   Imported By: Arta Bruce 08/02/2010 15:41:46  _____________________________________________________________________  External Attachment:    Type:   Image     Comment:   External Document

## 2010-11-25 NOTE — Letter (Signed)
Summary: ALLERGY & ASTHMA NOTES  ALLERGY & ASTHMA NOTES   Imported By: Arta Bruce 01/26/2010 15:50:07  _____________________________________________________________________  External Attachment:    Type:   Image     Comment:   External Document

## 2010-11-25 NOTE — Miscellaneous (Signed)
Summary: Allergy & ASTHMA  Allergy & ASTHMA   Imported By: Arta Bruce 02/06/2008 15:45:24  _____________________________________________________________________  External Attachment:    Type:   Image     Comment:   External Document

## 2011-01-17 ENCOUNTER — Other Ambulatory Visit: Payer: Self-pay | Admitting: Physician Assistant

## 2011-01-20 ENCOUNTER — Telehealth (INDEPENDENT_AMBULATORY_CARE_PROVIDER_SITE_OTHER): Payer: Self-pay | Admitting: Internal Medicine

## 2011-01-20 NOTE — Telephone Encounter (Signed)
[address] °

## 2011-01-21 ENCOUNTER — Other Ambulatory Visit: Payer: Self-pay | Admitting: *Deleted

## 2011-01-21 NOTE — Telephone Encounter (Signed)
S/w pharmacist today and stated that SW is not longer w/[facility] and to please forward medications to [facility].

## 2011-01-25 NOTE — Progress Notes (Signed)
Summary: MED REFILL Combivent  Phone Note Call from Patient Call back at Home Phone [phone]   Reason for Call: Refill Medication Summary of Call: PT NEEDS REFILL ON CONBIVENT CALLED INTO [facility] Initial call taken by: Ayesha Rumpf,  January 20, 2011 12:46 PM  Follow-up for Phone Call        Pt. states pharmacy says no refills available-confirmed with pharmacy, refill completed.  Pt. notified to check pharmacy later today. Follow-up by: Dutch Quint RN,  January 20, 2011 1:07 PM    Prescriptions: COMBIVENT 103-18 MCG/ACT AERO (IPRATROPIUM-ALBUTEROL) 1-2 puffs three to four times a day  #1 x 3   Entered by:   Dutch Quint RN   Authorized by:   Julieanne Manson MD   Signed by:   Dutch Quint RN on 01/20/2011   Method used:   Electronically to        [facility] [phone]* (retail)       [address]       [address]       [place], [place]  [address]       Ph: [phone]       Fax: [phone]   RxID:   [id number]

## 2011-02-01 ENCOUNTER — Other Ambulatory Visit: Payer: Self-pay | Admitting: Physician Assistant

## 2011-02-02 NOTE — Telephone Encounter (Signed)
S/w pharmacy today and advised these meds need to be filled w/[facility] Delrae Alfred, pharmacist gave verbal understanding.

## 2011-04-09 ENCOUNTER — Emergency Department (HOSPITAL_COMMUNITY): Payer: Self-pay

## 2011-04-09 ENCOUNTER — Emergency Department (HOSPITAL_COMMUNITY)
Admission: EM | Admit: 2011-04-09 | Discharge: 2011-04-09 | Disposition: A | Discharge status: Home / Self Care | Payer: Medicare Other | Attending: Emergency Medicine | Admitting: Emergency Medicine

## 2011-04-09 ENCOUNTER — Emergency Department (HOSPITAL_COMMUNITY): Payer: Medicare Other

## 2011-04-09 DIAGNOSIS — R000 Tachycardia, unspecified: Secondary | ICD-10-CM | POA: Insufficient documentation

## 2011-04-09 DIAGNOSIS — L989 Disorder of the skin and subcutaneous tissue, unspecified: Secondary | ICD-10-CM | POA: Insufficient documentation

## 2011-04-09 DIAGNOSIS — R109 Unspecified abdominal pain: Secondary | ICD-10-CM | POA: Insufficient documentation

## 2011-04-09 DIAGNOSIS — R071 Chest pain on breathing: Secondary | ICD-10-CM | POA: Insufficient documentation

## 2011-04-09 DIAGNOSIS — Z79899 Other long term (current) drug therapy: Secondary | ICD-10-CM | POA: Insufficient documentation

## 2011-04-09 DIAGNOSIS — M25519 Pain in unspecified shoulder: Secondary | ICD-10-CM | POA: Insufficient documentation

## 2011-04-09 LAB — COMPREHENSIVE METABOLIC PANEL
ALT: 23 U/L (ref 0–53)
AST: 21 U/L (ref 0–37)
Albumin: 4.3 g/dL (ref 3.5–5.2)
Alkaline Phosphatase: 60 U/L (ref 39–117)
BUN: 10 mg/dL (ref 6–23)
CO2: 26 mEq/L (ref 19–32)
Calcium: 9.7 mg/dL (ref 8.4–10.5)
Chloride: 102 mEq/L (ref 96–112)
Creatinine, Ser: 0.8 mg/dL (ref 0.50–1.35)
GFR calc Af Amer: >60 mL/min (ref >60)
GFR calc non Af Amer: >60 mL/min (ref >60)
Glucose, Bld: 93 mg/dL (ref 70–99)
Potassium: 4 mEq/L (ref 3.5–5.1)
Sodium: 137 mEq/L (ref 135–145)
Total Bilirubin: 0.2 mg/dL — ABNORMAL LOW (ref 0.3–1.2)
Total Protein: 7.7 g/dL (ref 6.0–8.3)

## 2011-04-09 LAB — CBC
HCT: 41.7 % (ref 39.0–52.0)
Hemoglobin: 15 g/dL (ref 13.0–17.0)
MCH: 31.1 pg (ref 26.0–34.0)
MCHC: 36 g/dL (ref 30.0–36.0)
MCV: 86.3 fL (ref 78.0–100.0)
Platelets: 238 10*3/uL (ref 150–400)
RBC: 4.83 MIL/uL (ref 4.22–5.81)
RDW: 14 % (ref 11.5–15.5)
WBC: 13.4 10*3/uL — ABNORMAL HIGH (ref 4.0–10.5)

## 2011-04-09 LAB — URINALYSIS, ROUTINE W REFLEX MICROSCOPIC
Bilirubin Urine: NEGATIVE
Glucose, UA: NEGATIVE mg/dL
Hgb urine dipstick: NEGATIVE
Ketones, ur: NEGATIVE mg/dL
Leukocytes, UA: NEGATIVE
Nitrite: NEGATIVE
Protein, ur: NEGATIVE mg/dL
Specific Gravity, Urine: 1.016 (ref 1.005–1.030)
Urobilinogen, UA: 0.2 mg/dL (ref 0.0–1.0)
pH: 6 (ref 5.0–8.0)

## 2011-04-09 LAB — LIPASE, BLOOD: Lipase: 28 U/L (ref 11–59)

## 2011-04-09 LAB — DIFFERENTIAL
Basophils Absolute: 0 10*3/uL (ref 0.0–0.1)
Basophils Relative: 0 % (ref 0–1)
Eosinophils Absolute: 0.5 10*3/uL (ref 0.0–0.7)
Eosinophils Relative: 4 % (ref 0–5)
Lymphocytes Relative: 20 % (ref 12–46)
Lymphs Abs: 2.6 10*3/uL (ref 0.7–4.0)
Monocytes Absolute: 1.4 10*3/uL — ABNORMAL HIGH (ref 0.1–1.0)
Monocytes Relative: 11 % (ref 3–12)
Neutro Abs: 8.4 10*3/uL — ABNORMAL HIGH (ref 1.7–7.7)
Neutrophils Relative %: 65 % (ref 43–77)

## 2011-05-08 ENCOUNTER — Encounter (HOSPITAL_COMMUNITY): Payer: Self-pay

## 2011-05-08 ENCOUNTER — Inpatient Hospital Stay (INDEPENDENT_AMBULATORY_CARE_PROVIDER_SITE_OTHER)
Admission: RE | Admit: 2011-05-08 | Discharge: 2011-05-08 | Disposition: A | Discharge status: Home / Self Care | Payer: Medicare Other | Source: Ambulatory Visit | Attending: Emergency Medicine | Admitting: Emergency Medicine

## 2011-05-08 ENCOUNTER — Ambulatory Visit (INDEPENDENT_AMBULATORY_CARE_PROVIDER_SITE_OTHER): Payer: Medicare Other

## 2011-05-08 DIAGNOSIS — M79609 Pain in unspecified limb: Secondary | ICD-10-CM

## 2011-05-08 DIAGNOSIS — S9000XA Contusion of unspecified ankle, initial encounter: Secondary | ICD-10-CM

## 2011-05-08 DIAGNOSIS — S93409A Sprain of unspecified ligament of unspecified ankle, initial encounter: Secondary | ICD-10-CM

## 2011-07-26 LAB — CBC
HCT: 43.5
Hemoglobin: 15.5
MCHC: 35.7
MCV: 89.4
Platelets: 278
RBC: 4.87
RDW: 14.6
WBC: 10.7 — ABNORMAL HIGH

## 2011-07-26 LAB — PROTIME-INR
INR: 0.9
Prothrombin Time: 12.6

## 2011-10-31 DIAGNOSIS — A630 Anogenital (venereal) warts: Secondary | ICD-10-CM | POA: Diagnosis not present

## 2011-11-02 DIAGNOSIS — L2089 Other atopic dermatitis: Secondary | ICD-10-CM | POA: Diagnosis not present

## 2011-11-02 DIAGNOSIS — H01009 Unspecified blepharitis unspecified eye, unspecified eyelid: Secondary | ICD-10-CM | POA: Diagnosis not present

## 2011-11-02 DIAGNOSIS — H2510 Age-related nuclear cataract, unspecified eye: Secondary | ICD-10-CM | POA: Diagnosis not present

## 2011-11-02 DIAGNOSIS — H18609 Keratoconus, unspecified, unspecified eye: Secondary | ICD-10-CM | POA: Diagnosis not present

## 2011-11-28 DIAGNOSIS — L408 Other psoriasis: Secondary | ICD-10-CM | POA: Diagnosis not present

## 2011-11-28 DIAGNOSIS — Z79899 Other long term (current) drug therapy: Secondary | ICD-10-CM | POA: Diagnosis not present

## 2011-12-09 DIAGNOSIS — A630 Anogenital (venereal) warts: Secondary | ICD-10-CM | POA: Diagnosis not present

## 2011-12-14 DIAGNOSIS — L2089 Other atopic dermatitis: Secondary | ICD-10-CM | POA: Diagnosis not present

## 2011-12-14 DIAGNOSIS — H2510 Age-related nuclear cataract, unspecified eye: Secondary | ICD-10-CM | POA: Diagnosis not present

## 2011-12-14 DIAGNOSIS — H18609 Keratoconus, unspecified, unspecified eye: Secondary | ICD-10-CM | POA: Diagnosis not present

## 2012-01-12 ENCOUNTER — Telehealth: Payer: Self-pay | Admitting: Medical Oncology

## 2012-01-12 NOTE — Telephone Encounter (Signed)
Wrong chart opened

## 2012-02-17 DIAGNOSIS — L259 Unspecified contact dermatitis, unspecified cause: Secondary | ICD-10-CM | POA: Diagnosis not present

## 2012-02-17 DIAGNOSIS — L408 Other psoriasis: Secondary | ICD-10-CM | POA: Diagnosis not present

## 2012-02-17 DIAGNOSIS — Z79899 Other long term (current) drug therapy: Secondary | ICD-10-CM | POA: Diagnosis not present

## 2012-02-22 DIAGNOSIS — F411 Generalized anxiety disorder: Secondary | ICD-10-CM | POA: Diagnosis not present

## 2012-02-22 DIAGNOSIS — G4700 Insomnia, unspecified: Secondary | ICD-10-CM | POA: Diagnosis not present

## 2012-04-18 DIAGNOSIS — H04129 Dry eye syndrome of unspecified lacrimal gland: Secondary | ICD-10-CM | POA: Diagnosis not present

## 2012-04-18 DIAGNOSIS — H01009 Unspecified blepharitis unspecified eye, unspecified eyelid: Secondary | ICD-10-CM | POA: Diagnosis not present

## 2012-04-18 DIAGNOSIS — H2510 Age-related nuclear cataract, unspecified eye: Secondary | ICD-10-CM | POA: Diagnosis not present

## 2012-04-18 DIAGNOSIS — H1045 Other chronic allergic conjunctivitis: Secondary | ICD-10-CM | POA: Diagnosis not present

## 2012-05-17 DIAGNOSIS — L408 Other psoriasis: Secondary | ICD-10-CM | POA: Diagnosis not present

## 2012-05-17 DIAGNOSIS — Z79899 Other long term (current) drug therapy: Secondary | ICD-10-CM | POA: Diagnosis not present

## 2012-05-18 DIAGNOSIS — L408 Other psoriasis: Secondary | ICD-10-CM | POA: Diagnosis not present

## 2012-08-08 DIAGNOSIS — H04129 Dry eye syndrome of unspecified lacrimal gland: Secondary | ICD-10-CM | POA: Diagnosis not present

## 2012-08-08 DIAGNOSIS — L2089 Other atopic dermatitis: Secondary | ICD-10-CM | POA: Diagnosis not present

## 2012-08-17 ENCOUNTER — Other Ambulatory Visit: Payer: Self-pay | Admitting: Dermatology

## 2012-08-17 DIAGNOSIS — Z79899 Other long term (current) drug therapy: Secondary | ICD-10-CM | POA: Diagnosis not present

## 2012-08-17 DIAGNOSIS — D485 Neoplasm of uncertain behavior of skin: Secondary | ICD-10-CM | POA: Diagnosis not present

## 2012-08-17 DIAGNOSIS — L988 Other specified disorders of the skin and subcutaneous tissue: Secondary | ICD-10-CM | POA: Diagnosis not present

## 2012-08-17 DIAGNOSIS — L2089 Other atopic dermatitis: Secondary | ICD-10-CM | POA: Diagnosis not present

## 2012-08-22 DIAGNOSIS — M549 Dorsalgia, unspecified: Secondary | ICD-10-CM | POA: Diagnosis not present

## 2012-08-22 DIAGNOSIS — J45909 Unspecified asthma, uncomplicated: Secondary | ICD-10-CM | POA: Diagnosis not present

## 2012-08-22 DIAGNOSIS — F17200 Nicotine dependence, unspecified, uncomplicated: Secondary | ICD-10-CM | POA: Diagnosis not present

## 2012-08-22 DIAGNOSIS — Z23 Encounter for immunization: Secondary | ICD-10-CM | POA: Diagnosis not present

## 2012-08-22 DIAGNOSIS — G2589 Other specified extrapyramidal and movement disorders: Secondary | ICD-10-CM | POA: Diagnosis not present

## 2012-08-22 DIAGNOSIS — E785 Hyperlipidemia, unspecified: Secondary | ICD-10-CM | POA: Diagnosis not present

## 2012-08-22 DIAGNOSIS — R7989 Other specified abnormal findings of blood chemistry: Secondary | ICD-10-CM | POA: Diagnosis not present

## 2012-08-22 DIAGNOSIS — K2100 Gastro-esophageal reflux disease with esophagitis, without bleeding: Secondary | ICD-10-CM | POA: Diagnosis not present

## 2012-08-22 DIAGNOSIS — K219 Gastro-esophageal reflux disease without esophagitis: Secondary | ICD-10-CM | POA: Diagnosis not present

## 2012-12-22 ENCOUNTER — Encounter: Payer: Self-pay | Admitting: *Deleted

## 2013-01-02 DIAGNOSIS — B999 Unspecified infectious disease: Secondary | ICD-10-CM | POA: Diagnosis not present

## 2013-01-02 DIAGNOSIS — H04129 Dry eye syndrome of unspecified lacrimal gland: Secondary | ICD-10-CM | POA: Diagnosis not present

## 2013-01-02 DIAGNOSIS — H169 Unspecified keratitis: Secondary | ICD-10-CM | POA: Diagnosis not present

## 2013-01-24 DIAGNOSIS — Z79899 Other long term (current) drug therapy: Secondary | ICD-10-CM | POA: Diagnosis not present

## 2013-01-24 DIAGNOSIS — L2089 Other atopic dermatitis: Secondary | ICD-10-CM | POA: Diagnosis not present

## 2013-02-20 ENCOUNTER — Ambulatory Visit: Payer: Self-pay | Admitting: Physician Assistant

## 2013-03-01 ENCOUNTER — Ambulatory Visit: Payer: Medicare Other | Admitting: Physician Assistant

## 2013-03-04 ENCOUNTER — Other Ambulatory Visit: Payer: Self-pay | Admitting: Physician Assistant

## 2013-03-05 NOTE — Telephone Encounter (Signed)
Medication refilled per protocol. 

## 2013-03-19 ENCOUNTER — Other Ambulatory Visit: Payer: Self-pay | Admitting: Physician Assistant

## 2013-03-19 ENCOUNTER — Ambulatory Visit
Admission: RE | Admit: 2013-03-19 | Discharge: 2013-03-19 | Disposition: A | Discharge status: Home / Self Care | Payer: Medicare Other | Source: Ambulatory Visit | Attending: Physician Assistant | Admitting: Physician Assistant

## 2013-03-19 ENCOUNTER — Ambulatory Visit (INDEPENDENT_AMBULATORY_CARE_PROVIDER_SITE_OTHER): Payer: Medicare Other | Admitting: Physician Assistant

## 2013-03-19 ENCOUNTER — Encounter: Payer: Self-pay | Admitting: Physician Assistant

## 2013-03-19 VITALS — BP 154/96 | HR 80 | Temp 97.1°F | Resp 18 | Ht 66.5 in | Wt 219.0 lb

## 2013-03-19 DIAGNOSIS — R739 Hyperglycemia, unspecified: Secondary | ICD-10-CM

## 2013-03-19 DIAGNOSIS — R209 Unspecified disturbances of skin sensation: Secondary | ICD-10-CM | POA: Diagnosis not present

## 2013-03-19 DIAGNOSIS — M79602 Pain in left arm: Secondary | ICD-10-CM

## 2013-03-19 DIAGNOSIS — M79609 Pain in unspecified limb: Secondary | ICD-10-CM | POA: Diagnosis not present

## 2013-03-19 DIAGNOSIS — Z309 Encounter for contraceptive management, unspecified: Secondary | ICD-10-CM

## 2013-03-19 DIAGNOSIS — R29898 Other symptoms and signs involving the musculoskeletal system: Secondary | ICD-10-CM

## 2013-03-19 DIAGNOSIS — R7309 Other abnormal glucose: Secondary | ICD-10-CM | POA: Diagnosis not present

## 2013-03-19 DIAGNOSIS — IMO0001 Reserved for inherently not codable concepts without codable children: Secondary | ICD-10-CM

## 2013-03-19 LAB — HEMOGLOBIN A1C, FINGERSTICK: Hgb A1C (fingerstick): 5.7 % (ref <5.7)

## 2013-03-19 NOTE — Progress Notes (Signed)
Patient ID: Adam Lozano MRN: [id number], DOB: 02/22/1971, 42 y.o. Date of Encounter: @DATE @  Chief Complaint:  Chief Complaint  Patient presents with  . routine check up  c/o left arm pain x 2 mths    HPI: 42 y.o. year old male  presents for routine f/u of:  1- For about 2 months he has noticed some achy discomfort in left arm near elbow region. Also, sometimes can be holding a cup or something and left arm just feels weak. Also states that for 5 years, when he walks,after walking long time (he says after walking ) his left fingers feel numb. He holds the left arm up and the numbness resolves. He is able to continue walking with no problem-No numbness, tingling in arm. Never has discofort in neck and no increased SOB/DOE and no chest pressure, heaviness, tightness. Again, this has been happening 5 years per pt and numbness in fingers resolves with position change.  Denies any pain in the neck or shoulder. No pain at the wrist.   2-Wants to have Vasectomy  3- "Last time I had lab, I was told to f/u about something on the labwork"  Past Medical History  Diagnosis Date  . Asthma   . GERD (gastroesophageal reflux disease)   . Low back pain     Chronic  . Atopic dermatitis      Home Meds: See attached medication section for current medication list. Any medications entered into computer today will not appear on this note's list. The medications listed below were entered prior to today. Current Outpatient Prescriptions on File Prior to Visit  Medication Sig Dispense Refill  . Artificial Tear Ointment (ARTIFICIAL TEARS) ointment as needed.      . carboxymethylcellulose (REFRESH PLUS) 0.5 % SOLN 1 drop as directed.      . clobetasol cream (TEMOVATE) 0.05 % Apply topically 2 (two) times daily.      . COMBIVENT RESPIMAT 20-100 MCG/ACT AERS respimat INHALE 1 PUFF BY MOUTH FOUR TIMES DAILY . MAXIMUM 6 PUFFS PER DAY  4 g  PRN  . cromolyn (OPTICROM) 4 % ophthalmic solution 1 drop 4  (four) times daily.      . fish oil-omega-3 fatty acids 1000 MG capsule Take 2 g by mouth daily.      . fluorometholone (FML) 0.1 % ophthalmic ointment once a week.      . Fluticasone-Salmeterol (ADVAIR) 100-50 MCG/DOSE AEPB Inhale 1 puff into the lungs every 12 (twelve) hours.      . folic acid (FOLVITE) 400 MCG tablet Take 400 mcg by mouth daily.      Marland Kitchen glucose blood test strip 1 each by Other route as needed for other. Use as instructed      . methotrexate (RHEUMATREX) 2.5 MG tablet Take 2.5 mg by mouth once a week. Caution:Chemotherapy. Protect from light.      . montelukast (SINGULAIR) 10 MG tablet Take 10 mg by mouth at bedtime.      . pimecrolimus (ELIDEL) 1 % cream Apply topically 2 (two) times daily.      . famotidine (PEPCID) 20 MG tablet Take 20 mg by mouth at bedtime as needed for heartburn.      . zolpidem (AMBIEN) 5 MG tablet Take 10 mg by mouth at bedtime as needed for sleep.       No current facility-administered medications on file prior to visit.    Allergies:  Allergies  Allergen Reactions  . Peanut-Containing Drug Products  History   Social History  . Marital Status: Single    Spouse Name: N/A    Number of Children: N/A  . Years of Education: N/A   Occupational History  . Not on file.   Social History Main Topics  . Smoking status: Former Smoker    Quit date: 11/25/2011  . Smokeless tobacco: Not on file  . Alcohol Use: No  . Drug Use: Not on file  . Sexually Active: Not on file   Other Topics Concern  . Not on file   Social History Narrative  . No narrative on file    No family history on file.   Review of Systems:  See HPI for pertinent ROS. All other ROS negative.    Physical Exam: Blood pressure 154/96, pulse 80, temperature 97.1 F (36.2 C), temperature source Oral, resp. rate 18, height 5' 6.5" (1.689 m), weight 219 lb (99.338 kg)., Body mass index is 34.82 kg/(m^2). General: WNWD WM. Appears in no acute distress. Neck: Supple. No  thyromegaly. No lymphadenopathy.No Pain with Palpation of Neck. Full ROM.  Lungs: Clear bilaterally to auscultation without wheezes, rales, or rhonchi. Breathing is unlabored. Heart: RRR with S1 S2. No murmurs, rubs, or gallops. Abdomen: Soft, non-tender, non-distended with normoactive bowel sounds. No hepatomegaly. No rebound/guarding. No obvious abdominal masses. Musculoskeletal:  Strength and tone normal for age. Left Elbow: Medial Epicondyle: Normal. Olecranon Process: normal, nontender. Lateral Epicondyle: Mild tenderness with palpation. But can extend elbow and hyperextend middle finger with no pain.  Left Shoulder : normall exam. Left Wrist: Normal Exam. Extremities/Skin: Warm and dry. No clubbing or cyanosis. No edema. No rashes or suspicious lesions. Neuro: Alert and oriented X 3. Moves all extremities spontaneously. Gait is normal. CNII-XII grossly in tact. Psych:  Responds to questions appropriately with a normal affect.     ASSESSMENT AND PLAN:  42 y.o. year old male with  1. Left arm pain - DG Cervical Spine Complete; Future - US Arterial Seg Multiple; Future 2. Left arm weakness - DG Cervical Spine Complete; Future - US Arterial Seg Multiple; Future  Will obtain Cervical Spine XRay to assess for Cervical Radiculopathy as cause of his symptoms.  Will obtain Upper Extremity Arterial U/S to assess blood flow.  3. Contraception Interested in Vasectomy-Will refer - Ambulatory referral to Urology  4. Hyperglycemia Lab 08/2012 showed A1C 6.2.  Pt says he has recently stopped sodas and cut back on cookies and candy.  Today I gave and reviewd Carbohydrate handout. Discussed foods to eliminate/reduce but also discussed examples of meals that he CAN eat.  Will check A1C then f/u. Discussed that if A1C remains elevated, we will need to address lipids as well. Discussed that goal for cholesterol is lower for Diabetics.  He is not fasting now so cannot check lipids now. Will check  A1C then decide if he needs to return fasting for FLP.  - Hemoglobin A1C, fingerstick  His BP reading was high today but I reviewed chart and BP usually good. Will monitor this as well. If develops Diabetes, will need to add ACE Inh.  Check A1C then f/u   Signed, [address] [place], [place], [facility] 03/19/2013 4:27 PM

## 2013-03-21 ENCOUNTER — Ambulatory Visit
Admission: RE | Admit: 2013-03-21 | Discharge: 2013-03-21 | Disposition: A | Discharge status: Home / Self Care | Payer: Medicare Other | Source: Ambulatory Visit | Attending: Physician Assistant | Admitting: Physician Assistant

## 2013-03-21 DIAGNOSIS — M79602 Pain in left arm: Secondary | ICD-10-CM

## 2013-03-21 DIAGNOSIS — R209 Unspecified disturbances of skin sensation: Secondary | ICD-10-CM | POA: Diagnosis not present

## 2013-03-21 DIAGNOSIS — R29898 Other symptoms and signs involving the musculoskeletal system: Secondary | ICD-10-CM

## 2013-03-22 ENCOUNTER — Telehealth: Payer: Self-pay | Admitting: Family Medicine

## 2013-03-22 DIAGNOSIS — R200 Anesthesia of skin: Secondary | ICD-10-CM

## 2013-03-22 DIAGNOSIS — R202 Paresthesia of skin: Secondary | ICD-10-CM

## 2013-03-22 NOTE — Telephone Encounter (Signed)
Pt informed of doppler and xray results.  MRI ordered

## 2013-03-22 NOTE — Telephone Encounter (Signed)
Message copied by Donne Anon on Fri Mar 22, 2013  2:36 PM ------      Message from: Allayne Butcher      Created: Thu Mar 21, 2013  4:52 PM       Tell Pt that U/S normal. Shows normal arterial blood flow to arms.       Cervical Spine XRay showed some abnormality. I think this is the cause of his symtoms. Needs further evaluation. Needs MRI Cervical Spine. Make sure pt agreeable. Is so, then order MRI Cervical Spine. ------

## 2013-03-26 ENCOUNTER — Other Ambulatory Visit: Payer: Self-pay | Admitting: Physician Assistant

## 2013-03-26 DIAGNOSIS — Z139 Encounter for screening, unspecified: Secondary | ICD-10-CM

## 2013-03-29 ENCOUNTER — Ambulatory Visit
Admission: RE | Admit: 2013-03-29 | Discharge: 2013-03-29 | Disposition: A | Discharge status: Home / Self Care | Payer: Medicare Other | Source: Ambulatory Visit | Attending: Physician Assistant | Admitting: Physician Assistant

## 2013-03-29 DIAGNOSIS — R200 Anesthesia of skin: Secondary | ICD-10-CM

## 2013-03-29 DIAGNOSIS — M4802 Spinal stenosis, cervical region: Secondary | ICD-10-CM | POA: Diagnosis not present

## 2013-03-29 DIAGNOSIS — R202 Paresthesia of skin: Secondary | ICD-10-CM

## 2013-03-29 DIAGNOSIS — Z139 Encounter for screening, unspecified: Secondary | ICD-10-CM

## 2013-03-29 DIAGNOSIS — M5020 Other cervical disc displacement, unspecified cervical region: Secondary | ICD-10-CM | POA: Diagnosis not present

## 2013-03-29 DIAGNOSIS — Z135 Encounter for screening for eye and ear disorders: Secondary | ICD-10-CM | POA: Diagnosis not present

## 2013-04-02 DIAGNOSIS — Z3009 Encounter for other general counseling and advice on contraception: Secondary | ICD-10-CM | POA: Diagnosis not present

## 2013-04-02 DIAGNOSIS — N4340 Spermatocele of epididymis, unspecified: Secondary | ICD-10-CM | POA: Diagnosis not present

## 2013-05-07 DIAGNOSIS — L2089 Other atopic dermatitis: Secondary | ICD-10-CM | POA: Diagnosis not present

## 2013-05-07 DIAGNOSIS — Z79899 Other long term (current) drug therapy: Secondary | ICD-10-CM | POA: Diagnosis not present

## 2013-06-14 ENCOUNTER — Other Ambulatory Visit: Payer: Self-pay | Admitting: Physician Assistant

## 2013-06-17 NOTE — Telephone Encounter (Signed)
Medication refilled per protocol. 

## 2013-07-05 DIAGNOSIS — Z302 Encounter for sterilization: Secondary | ICD-10-CM | POA: Diagnosis not present

## 2013-07-05 DIAGNOSIS — A630 Anogenital (venereal) warts: Secondary | ICD-10-CM | POA: Diagnosis not present

## 2013-08-06 DIAGNOSIS — L2089 Other atopic dermatitis: Secondary | ICD-10-CM | POA: Diagnosis not present

## 2013-08-06 DIAGNOSIS — Z79899 Other long term (current) drug therapy: Secondary | ICD-10-CM | POA: Diagnosis not present

## 2013-08-11 DIAGNOSIS — Z23 Encounter for immunization: Secondary | ICD-10-CM | POA: Diagnosis not present

## 2013-08-29 DIAGNOSIS — H04129 Dry eye syndrome of unspecified lacrimal gland: Secondary | ICD-10-CM | POA: Diagnosis not present

## 2013-08-29 DIAGNOSIS — H16259 Phlyctenular keratoconjunctivitis, unspecified eye: Secondary | ICD-10-CM | POA: Diagnosis not present

## 2013-08-29 DIAGNOSIS — H18609 Keratoconus, unspecified, unspecified eye: Secondary | ICD-10-CM | POA: Diagnosis not present

## 2013-08-29 DIAGNOSIS — H16109 Unspecified superficial keratitis, unspecified eye: Secondary | ICD-10-CM | POA: Diagnosis not present

## 2013-08-29 DIAGNOSIS — H01139 Eczematous dermatitis of unspecified eye, unspecified eyelid: Secondary | ICD-10-CM | POA: Diagnosis not present

## 2013-08-29 DIAGNOSIS — H01029 Squamous blepharitis unspecified eye, unspecified eyelid: Secondary | ICD-10-CM | POA: Diagnosis not present

## 2013-09-03 DIAGNOSIS — L2089 Other atopic dermatitis: Secondary | ICD-10-CM | POA: Diagnosis not present

## 2013-09-18 DIAGNOSIS — H2510 Age-related nuclear cataract, unspecified eye: Secondary | ICD-10-CM | POA: Diagnosis not present

## 2013-09-18 DIAGNOSIS — H18609 Keratoconus, unspecified, unspecified eye: Secondary | ICD-10-CM | POA: Diagnosis not present

## 2013-09-18 DIAGNOSIS — H1010 Acute atopic conjunctivitis, unspecified eye: Secondary | ICD-10-CM | POA: Diagnosis not present

## 2013-09-23 ENCOUNTER — Encounter: Payer: Self-pay | Admitting: Physician Assistant

## 2013-09-23 ENCOUNTER — Ambulatory Visit (INDEPENDENT_AMBULATORY_CARE_PROVIDER_SITE_OTHER): Payer: Medicare Other | Admitting: Physician Assistant

## 2013-09-23 VITALS — BP 114/64 | HR 84 | Temp 97.7°F | Resp 18 | Wt 222.0 lb

## 2013-09-23 DIAGNOSIS — R7309 Other abnormal glucose: Secondary | ICD-10-CM | POA: Diagnosis not present

## 2013-09-23 DIAGNOSIS — G2581 Restless legs syndrome: Secondary | ICD-10-CM

## 2013-09-23 DIAGNOSIS — R739 Hyperglycemia, unspecified: Secondary | ICD-10-CM

## 2013-09-23 LAB — HEMOGLOBIN A1C, FINGERSTICK: Hgb A1C (fingerstick): 5.7 % (ref <5.7)

## 2013-09-23 NOTE — Progress Notes (Signed)
Patient ID: Adam Lozano MRN: [id number], DOB: 1970-11-16, 42 y.o. Date of Encounter: @DATE @  Chief Complaint:  Chief Complaint  Patient presents with  . 6 mth follow up    MRI results from May    HPI: 42 y.o. year old white male  presents for routine followup office visit.  At his last visit with me 03/19/13 he reported having some achy discomfort in his left arm near the elbow region. At times he is also noticing some weakness in that arm and hand. See that note for further detail. At that visit ordered an upper extremity arterial ultrasound as well as a cervical spine x-ray.  The ultrasound showed no signs of stenosis.  The x-ray reported "cannot exclude mild foraminal narrowing on the left at C3-C4 and C4-C5."  Therefore, got a followup MRI. This was performed on 03/29/13. It showed:  1. Disc and endplate degeneration at C3/C4 and C4/C5, including a small disc herniation at the latter, but eccentric to the right and appear to mostly affects the right C4 and C5 nerve levels. 2. mild left sided facet hypertrophy at C2-C3, but no convincing associated left C3 foraminal stenosis.  Today patient states that his symptoms are actually resolved. Says that he thinks that the symptoms were secondary to lifting weights incorrectly.  Today he reports he still has some symptoms somewhat like restless leg syndrome. However he says that he has the sensation that his legs and his arms at times. He says that these symptoms occur at random and do not occur daily. They do not occur more frequently at night. He says that he gets a creepy crawly sensation under the skin it makes him want to move. He says that movement does ease these have been going on for in the past we checked iron level to see if there is any underlying iron deficiency to cause this but this was normal. The symptoms are not worsening and they are very infrequent there is no one to start medication or get further evaluation.  He does  make mention of using marijuana on a regular basis. He refers to himself as a "pothead"  He does not smoke cigarettes any longer.  He is no longer using Ambien. He says that he is no longer having any problems with insomnia.   Past Medical History  Diagnosis Date  . Asthma   . GERD (gastroesophageal reflux disease)   . Low back pain     Chronic  . Atopic dermatitis      Home Meds: See attached medication section for current medication list. Any medications entered into computer today will not appear on this note's list. The medications listed below were entered prior to today. Current Outpatient Prescriptions on File Prior to Visit  Medication Sig Dispense Refill  . ADVAIR DISKUS 100-50 MCG/DOSE AEPB USE ONE INHALATION BY MOUTH TWICE DAILY  60 each  5  . Artificial Tear Ointment (ARTIFICIAL TEARS) ointment as needed.      . carboxymethylcellulose (REFRESH PLUS) 0.5 % SOLN 1 drop as directed.      . clobetasol cream (TEMOVATE) 0.05 % Apply topically 2 (two) times daily.      . COMBIVENT RESPIMAT 20-100 MCG/ACT AERS respimat INHALE 1 PUFF BY MOUTH FOUR TIMES DAILY . MAXIMUM 6 PUFFS PER DAY  4 g  PRN  . cromolyn (OPTICROM) 4 % ophthalmic solution 1 drop 4 (four) times daily.      . fish oil-omega-3 fatty acids 1000 MG capsule  Take 2 g by mouth daily.      . fluorometholone (FML) 0.1 % ophthalmic ointment once a week.      . folic acid (FOLVITE) 400 MCG tablet Take 400 mcg by mouth daily.      Marland Kitchen glucose blood test strip 1 each by Other route as needed for other. Use as instructed      . hydrocortisone valerate cream (WESTCORT) 0.2 % Apply topically daily.      . hydrOXYzine (VISTARIL) 25 MG capsule Take 1 capsule by mouth as needed.      . methotrexate (RHEUMATREX) 2.5 MG tablet Take 2.5 mg by mouth once a week. Caution:Chemotherapy. Protect from light.      . montelukast (SINGULAIR) 10 MG tablet Take 10 mg by mouth at bedtime.      . pimecrolimus (ELIDEL) 1 % cream Apply topically 2  (two) times daily.      . famotidine (PEPCID) 20 MG tablet Take 20 mg by mouth at bedtime as needed for heartburn.      . zolpidem (AMBIEN) 5 MG tablet Take 10 mg by mouth at bedtime as needed for sleep.       No current facility-administered medications on file prior to visit.    Allergies:  Allergies  Allergen Reactions  . Peanut-Containing Drug Products     History   Social History  . Marital Status: Single    Spouse Name: N/A    Number of Children: N/A  . Years of Education: N/A   Occupational History  . Not on file.   Social History Main Topics  . Smoking status: Former Smoker    Quit date: 11/25/2011  . Smokeless tobacco: Not on file  . Alcohol Use: No  . Drug Use: Not on file  . Sexual Activity: Not on file   Other Topics Concern  . Not on file   Social History Narrative  . No narrative on file    No family history on file.   Review of Systems:  See HPI for pertinent ROS. All other ROS negative.    Physical Exam: Blood pressure 114/64, pulse 84, temperature 97.7 F (36.5 C), temperature source Oral, resp. rate 18, weight 222 lb (100.699 kg)., Body mass index is 35.3 kg/(m^2). General: WM. Appears in no acute distress. Neck: Supple. No thyromegaly. No lymphadenopathy. Lungs: Clear bilaterally to auscultation without wheezes, rales, or rhonchi. Breathing is unlabored. Heart: RRR with S1 S2. No murmurs, rubs, or gallops. Musculoskeletal:  Strength and tone normal for age. Extremities/Skin: Warm and dry. No clubbing or cyanosis. No edema. No rashes or suspicious lesions. Neuro: Alert and oriented X 3. Moves all extremities spontaneously. Gait is normal. CNII-XII grossly in tact. Psych:  Responds to questions appropriately with a normal affect.     ASSESSMENT AND PLAN:  42 y.o. year old male with  1. Hyperglycemia Lab 08/2013 showed A1c 6.2. After that he stop sodas and decrease cookies and candy intake. Her only on discussed carbohydrate handout and  discussed foods to eliminate/redo use of it discussed examples of meals that he can eat. He had followup A1c 03/19/13. A1c was 5.7. Recheck now.  - Hemoglobin A1C, fingerstick  2. RESTLESS LEGS SYNDROME Iron level normal in the past. He defers treatment or further evaluation of this.   Murray Hodgkins [place], [place], [facility] 09/23/2013 3:07 PM

## 2013-10-07 ENCOUNTER — Telehealth: Payer: Self-pay | Admitting: Physician Assistant

## 2013-10-07 NOTE — Telephone Encounter (Signed)
Hollister called a little bit ago and said he had a missed call from here I didn't see any telephone notes in and I didn't know if maybe you called him Call back number is [phone]

## 2013-11-05 DIAGNOSIS — Z79899 Other long term (current) drug therapy: Secondary | ICD-10-CM | POA: Diagnosis not present

## 2013-11-05 DIAGNOSIS — L2089 Other atopic dermatitis: Secondary | ICD-10-CM | POA: Diagnosis not present

## 2013-11-05 DIAGNOSIS — L259 Unspecified contact dermatitis, unspecified cause: Secondary | ICD-10-CM | POA: Diagnosis not present

## 2013-11-19 ENCOUNTER — Telehealth: Payer: Self-pay | Admitting: Family Medicine

## 2013-11-19 MED ORDER — BUDESONIDE-FORMOTEROL FUMARATE 160-4.5 MCG/ACT IN AERO: 2.0000 | INHALATION_SPRAY | Freq: Two times a day (BID) | RESPIRATORY_TRACT | Status: DC

## 2013-11-19 NOTE — Telephone Encounter (Signed)
Pt made aware of medication change. 

## 2013-11-19 NOTE — Telephone Encounter (Signed)
Notified that Advair Discus is not on patients formulary.  List of options given to provider and Symbicort was chosen to replace non formulary medication.  Pt called and made aware of change.

## 2013-12-11 DIAGNOSIS — H18609 Keratoconus, unspecified, unspecified eye: Secondary | ICD-10-CM | POA: Diagnosis not present

## 2013-12-11 DIAGNOSIS — H2510 Age-related nuclear cataract, unspecified eye: Secondary | ICD-10-CM | POA: Diagnosis not present

## 2014-01-13 ENCOUNTER — Telehealth: Payer: Self-pay | Admitting: Physician Assistant

## 2014-01-13 NOTE — Telephone Encounter (Signed)
Call back number is [phone] Pharmacy [facility]  Pt has had a chest cold for 2 weeks  Coughing up green stuff Would like to have something called in.   --please call pt to advise if this can be done or if he needs to come in for apt

## 2014-01-14 NOTE — Telephone Encounter (Signed)
Patient called with provider recommendations

## 2014-01-14 NOTE — Telephone Encounter (Signed)
If improving, I would use mucinex and wait it out.  If worsening, I would prescribe z-pack.

## 2014-01-14 NOTE — Telephone Encounter (Signed)
Having chest congestion with green sputum. No fever.  Green secretion x 1 week. No body aches or fatigue.  Feeling better today but didn't know if you thought needed antibiotics.

## 2014-01-20 ENCOUNTER — Other Ambulatory Visit: Payer: Self-pay | Admitting: Physician Assistant

## 2014-01-20 NOTE — Telephone Encounter (Signed)
Medication refilled per protocol. 

## 2014-02-06 DIAGNOSIS — L259 Unspecified contact dermatitis, unspecified cause: Secondary | ICD-10-CM | POA: Diagnosis not present

## 2014-02-06 DIAGNOSIS — L2089 Other atopic dermatitis: Secondary | ICD-10-CM | POA: Diagnosis not present

## 2014-02-06 DIAGNOSIS — Z79899 Other long term (current) drug therapy: Secondary | ICD-10-CM | POA: Diagnosis not present

## 2014-02-21 DIAGNOSIS — L2089 Other atopic dermatitis: Secondary | ICD-10-CM | POA: Diagnosis not present

## 2014-02-24 DIAGNOSIS — H18509 Unspecified hereditary corneal dystrophies, unspecified eye: Secondary | ICD-10-CM | POA: Diagnosis not present

## 2014-02-24 DIAGNOSIS — H2510 Age-related nuclear cataract, unspecified eye: Secondary | ICD-10-CM | POA: Diagnosis not present

## 2014-02-24 DIAGNOSIS — H25049 Posterior subcapsular polar age-related cataract, unspecified eye: Secondary | ICD-10-CM | POA: Diagnosis not present

## 2014-02-24 DIAGNOSIS — H16209 Unspecified keratoconjunctivitis, unspecified eye: Secondary | ICD-10-CM | POA: Diagnosis not present

## 2014-02-24 DIAGNOSIS — H10409 Unspecified chronic conjunctivitis, unspecified eye: Secondary | ICD-10-CM | POA: Diagnosis not present

## 2014-03-25 ENCOUNTER — Other Ambulatory Visit: Payer: Self-pay | Admitting: Physician Assistant

## 2014-03-25 NOTE — Telephone Encounter (Signed)
Medication refilled per protocol. 

## 2014-03-31 ENCOUNTER — Ambulatory Visit (INDEPENDENT_AMBULATORY_CARE_PROVIDER_SITE_OTHER): Payer: Medicare Other | Admitting: Physician Assistant

## 2014-03-31 ENCOUNTER — Encounter: Payer: Self-pay | Admitting: Physician Assistant

## 2014-03-31 VITALS — BP 154/80 | HR 104 | Temp 98.0°F | Resp 20 | Wt 222.0 lb

## 2014-03-31 DIAGNOSIS — M25521 Pain in right elbow: Secondary | ICD-10-CM

## 2014-03-31 DIAGNOSIS — M25529 Pain in unspecified elbow: Secondary | ICD-10-CM | POA: Diagnosis not present

## 2014-03-31 DIAGNOSIS — G2581 Restless legs syndrome: Secondary | ICD-10-CM

## 2014-03-31 DIAGNOSIS — E739 Lactose intolerance, unspecified: Secondary | ICD-10-CM | POA: Diagnosis not present

## 2014-03-31 DIAGNOSIS — R7309 Other abnormal glucose: Secondary | ICD-10-CM | POA: Diagnosis not present

## 2014-03-31 LAB — HEMOGLOBIN A1C, FINGERSTICK: Hgb A1C (fingerstick): 5.8 % — ABNORMAL HIGH (ref <5.7)

## 2014-03-31 NOTE — Progress Notes (Signed)
Patient ID: IFEANYICHUKWU WICKHAM MRN: [id number], DOB: 1971/05/10, 43 y.o. Date of Encounter: 03/31/2014, 12:54 PM    Chief Complaint:  Chief Complaint  Patient presents with  . 6 mth check up    c/o right forearm problem     HPI: 43 y.o. year old white male for 6 month followup visit.  Today he has one complaint. Reports that he has been having some discomfort in his right elbow area for over a year now. Says that sometimes when he just goes to pick up a cup or moves his arm in a twisting motion, he feels discomfort just distal to the right elbow area. Says he does spend time on the computer, playing the piano, and playing the guitar. Not much other repetitive motion of that arm. Had no acute injury or trauma to the area around the time of onset of this discomfort.   Has no numb, tingly sensation in the hand or fingers. Has no discomfort in the wrist area.  Also here for a routine six-month followup regarding history of hyperglycemia. Says he has cut back on sodas and carb intake.  However, today he does pull up his shirt and showed me the significant dermatitis he currently has. As well, he shows me his notepad where he has documented notes which includes recent prednisone.   Home Meds:   Outpatient Prescriptions Prior to Visit  Medication Sig Dispense Refill  . Artificial Tear Ointment (ARTIFICIAL TEARS) ointment as needed.      . budesonide-formoterol (SYMBICORT) 160-4.5 MCG/ACT inhaler Inhale 2 puffs into the lungs 2 (two) times daily.  1 Inhaler  3  . carboxymethylcellulose (REFRESH PLUS) 0.5 % SOLN 1 drop as directed.      . clobetasol cream (TEMOVATE) 0.05 % Apply topically 2 (two) times daily.      . COMBIVENT RESPIMAT 20-100 MCG/ACT AERS respimat INHALE 1 PUFF BY MOUTH FOUR TIMES DAILY, MAX OF 6 PUFFS PER DAY  4 g  5  . fish oil-omega-3 fatty acids 1000 MG capsule Take 2 g by mouth daily.      . folic acid (FOLVITE) 983 MCG tablet Take 400 mcg by mouth daily.      Marland Kitchen  glucose blood test strip 1 each by Other route as needed for other. Use as instructed      . hydrocortisone valerate cream (WESTCORT) 0.2 % Apply topically daily.      . methotrexate (RHEUMATREX) 2.5 MG tablet Take 2.5 mg by mouth once a week. Caution:Chemotherapy. Protect from light.      . montelukast (SINGULAIR) 10 MG tablet TAKE 1 TABLET BY MOUTH DAILY FOR ALLERGIES AND ASTHMA  30 tablet  PRN  . pimecrolimus (ELIDEL) 1 % cream Apply topically 2 (two) times daily.      . fluorometholone (FML) 0.1 % ophthalmic ointment once a week.      . hydrOXYzine (VISTARIL) 25 MG capsule Take 1 capsule by mouth as needed.      . cromolyn (OPTICROM) 4 % ophthalmic solution 1 drop 4 (four) times daily.      . famotidine (PEPCID) 20 MG tablet Take 20 mg by mouth at bedtime as needed for heartburn.      . zolpidem (AMBIEN) 5 MG tablet Take 10 mg by mouth at bedtime as needed for sleep.       No facility-administered medications prior to visit.    Allergies:  Allergies  Allergen Reactions  . Peanut-Containing Drug Products  Review of Systems: See HPI for pertinent ROS. All other ROS negative.    Physical Exam: Blood pressure 154/80, pulse 104, temperature 98 F (36.7 C), temperature source Oral, resp. rate 20, weight 222 lb (100.699 kg)., Body mass index is 35.3 kg/(m^2). General: Overweight WM with moderate abdominal obesity.  Appears in no acute distress. Neck: Supple. No thyromegaly. No lymphadenopathy. Lungs: Clear bilaterally to auscultation without wheezes, rales, or rhonchi. Breathing is unlabored. Heart: Regular rhythm. No murmurs, rubs, or gallops. Msk:  Strength and tone normal for age. Right elbow: Inspection is normal. There is no pain with palpation of the lateral epicondyles or the medial epicondyles. Phalens is normal.  No discomfort/reproduced symptoms with hyperextension of arm, wrist, middle finger.  Extremities/Skin: Warm and dry. Signficant Dermatitis, managed by  specialists.  Neuro: Alert and oriented X 3. Moves all extremities spontaneously. Gait is normal. CNII-XII grossly in tact. Psych:  Responds to questions appropriately with a normal affect.     ASSESSMENT AND PLAN:  43 y.o. year old male with  1. GLUCOSE INTOLERANCE --Frequent Prednisone use - Hemoglobin A1C, fingerstick   2. Right elbow pain Discussed with patient that findings do not seem to be consistent with epicondylitis. Also do not seem to be so consistent with carpal tunnel. He reports that this discomfort is quite troublesome to him and has been going on for greater than one year it is getting worse. He is interested in followup with orthopedics. - Ambulatory referral to Orthopedic Surgery  3. pressure is elevated today. However I did review that in the past blood pressure has been normal in the past. Specifically his last office visit with me was 09/23/13. Blood pressure at that time was 114/64. Will monitor this. Also, note that he is on prednisone and has been on prednisone recently. This may also be affecting his blood pressure.   [address] [place], [place], [facility] 03/31/2014 12:54 PM

## 2014-04-02 ENCOUNTER — Encounter: Payer: Self-pay | Admitting: Family Medicine

## 2014-04-15 ENCOUNTER — Other Ambulatory Visit: Payer: Self-pay | Admitting: Physician Assistant

## 2014-04-15 DIAGNOSIS — J449 Chronic obstructive pulmonary disease, unspecified: Secondary | ICD-10-CM

## 2014-04-15 NOTE — Telephone Encounter (Signed)
Medication refilled per protocol. 

## 2014-04-24 DIAGNOSIS — L259 Unspecified contact dermatitis, unspecified cause: Secondary | ICD-10-CM | POA: Diagnosis not present

## 2014-04-24 DIAGNOSIS — L089 Local infection of the skin and subcutaneous tissue, unspecified: Secondary | ICD-10-CM | POA: Diagnosis not present

## 2014-04-24 DIAGNOSIS — B999 Unspecified infectious disease: Secondary | ICD-10-CM | POA: Diagnosis not present

## 2014-04-24 DIAGNOSIS — L2089 Other atopic dermatitis: Secondary | ICD-10-CM | POA: Diagnosis not present

## 2014-05-06 DIAGNOSIS — M7710 Lateral epicondylitis, unspecified elbow: Secondary | ICD-10-CM | POA: Diagnosis not present

## 2014-05-06 DIAGNOSIS — G5630 Lesion of radial nerve, unspecified upper limb: Secondary | ICD-10-CM | POA: Diagnosis not present

## 2014-05-12 DIAGNOSIS — L2089 Other atopic dermatitis: Secondary | ICD-10-CM | POA: Diagnosis not present

## 2014-05-12 DIAGNOSIS — L259 Unspecified contact dermatitis, unspecified cause: Secondary | ICD-10-CM | POA: Diagnosis not present

## 2014-05-15 DIAGNOSIS — M7710 Lateral epicondylitis, unspecified elbow: Secondary | ICD-10-CM | POA: Diagnosis not present

## 2014-05-26 DIAGNOSIS — M7710 Lateral epicondylitis, unspecified elbow: Secondary | ICD-10-CM | POA: Diagnosis not present

## 2014-05-26 DIAGNOSIS — G5630 Lesion of radial nerve, unspecified upper limb: Secondary | ICD-10-CM | POA: Diagnosis not present

## 2014-06-02 DIAGNOSIS — H1010 Acute atopic conjunctivitis, unspecified eye: Secondary | ICD-10-CM | POA: Diagnosis not present

## 2014-06-02 DIAGNOSIS — Z961 Presence of intraocular lens: Secondary | ICD-10-CM | POA: Diagnosis not present

## 2014-06-02 DIAGNOSIS — H18609 Keratoconus, unspecified, unspecified eye: Secondary | ICD-10-CM | POA: Diagnosis not present

## 2014-06-23 ENCOUNTER — Ambulatory Visit
Admission: RE | Admit: 2014-06-23 | Discharge: 2014-06-23 | Disposition: A | Discharge status: Home / Self Care | Payer: Medicare Other | Source: Ambulatory Visit | Attending: Physician Assistant | Admitting: Physician Assistant

## 2014-06-23 ENCOUNTER — Encounter: Payer: Self-pay | Admitting: Physician Assistant

## 2014-06-23 ENCOUNTER — Ambulatory Visit (INDEPENDENT_AMBULATORY_CARE_PROVIDER_SITE_OTHER): Payer: Medicare Other | Admitting: Physician Assistant

## 2014-06-23 VITALS — BP 122/78 | HR 80 | Temp 98.6°F | Resp 18 | Ht 68.0 in | Wt 218.0 lb

## 2014-06-23 DIAGNOSIS — M549 Dorsalgia, unspecified: Secondary | ICD-10-CM | POA: Diagnosis not present

## 2014-06-23 DIAGNOSIS — M47817 Spondylosis without myelopathy or radiculopathy, lumbosacral region: Secondary | ICD-10-CM | POA: Diagnosis not present

## 2014-06-23 MED ORDER — CYCLOBENZAPRINE HCL 10 MG PO TABS: 10.0000 mg | ORAL_TABLET | Freq: Three times a day (TID) | ORAL | Status: DC | PRN

## 2014-06-23 MED ORDER — HYDROCODONE-ACETAMINOPHEN 5-325 MG PO TABS: 1.0000 | ORAL_TABLET | Freq: Four times a day (QID) | ORAL | Status: DC | PRN

## 2014-06-23 NOTE — Progress Notes (Signed)
Patient ID: Adam Lozano MRN: [id number], DOB: [birth year], 43 y.o. Date of Encounter: @DATE @  Chief Complaint:  Chief Complaint  Patient presents with  . Back Pain    wants referral to see specialist, would like pain med for back pain untik sees specialist    HPI: 43 y.o. year old white male  presents with above complaint.  Asked him if he had any prior history of problems with his back.  He says that when he was about 43 years old he tied a rope around his belt loops and then tied the other end around a tree branch. Says he hung from it but the belt loops broke and so he fell. He says he never told his parents about it and never had any evaluation after that --but he knows it must have caused some injury!!!  Says when he was around 43 years old he was working doing heating and air conditioning. Had an "episode" of back pain in which required him to go to the chiropractor.  Says that since then he has just had minor episodes that he had "been dealing with it."  Says that he has recently been renovating his trailer. Has been carrying ladders and carrying things. Says that about one week ago his low back "locked up".  Says some mornings he would try to get out of the bed and his back would be locked up. Says sometimes he would be laying on the sofa and would have to just roll off onto the floor so that he could then push himself up off the floor.   Says the pain is across both sides of his lower back. Reports that he has had no pain, no numbness, no tingling down either leg. No weakness in either leg. No incontinence of bowel or bladder.   Past Medical History  Diagnosis Date  . Asthma   . GERD (gastroesophageal reflux disease)   . Low back pain     Chronic  . Atopic dermatitis      Home Meds: Outpatient Prescriptions Prior to Visit  Medication Sig Dispense Refill  . Artificial Tear Ointment (ARTIFICIAL TEARS) ointment as needed.      . carboxymethylcellulose (REFRESH  PLUS) 0.5 % SOLN 1 drop as directed.      . clobetasol cream (TEMOVATE) 0.05 % Apply topically 2 (two) times daily.      . COMBIVENT RESPIMAT 20-100 MCG/ACT AERS respimat INHALE 1 PUFF BY MOUTH FOUR TIMES DAILY, MAX OF 6 PUFFS PER DAY  4 g  5  . fish oil-omega-3 fatty acids 1000 MG capsule Take 2 g by mouth daily.      . folic acid (FOLVITE) 119 MCG tablet Take 400 mcg by mouth daily.      . hydrocortisone valerate cream (WESTCORT) 0.2 % Apply topically daily.      . methotrexate (RHEUMATREX) 2.5 MG tablet Take 2.5 mg by mouth once a week. Caution:Chemotherapy. Protect from light.      . montelukast (SINGULAIR) 10 MG tablet TAKE 1 TABLET BY MOUTH DAILY FOR ALLERGIES AND ASTHMA  30 tablet  PRN  . NONFORMULARY OR COMPOUNDED ITEM Place 1 application into both eyes 2 (two) times daily as needed (Dexamethasone Phosphate 0.05%).      . pimecrolimus (ELIDEL) 1 % cream Apply topically 2 (two) times daily.      . SYMBICORT 160-4.5 MCG/ACT inhaler INHALE 2 PUFFS INTO THE LUNGS TWICE DAILY  10.2 g  11  . glucose blood test  strip 1 each by Other route as needed for other. Use as instructed       No facility-administered medications prior to visit.    Allergies:  Allergies  Allergen Reactions  . Peanut-Containing Drug Products     History   Social History  . Marital Status: Single    Spouse Name: N/A    Number of Children: N/A  . Years of Education: N/A   Occupational History  . Not on file.   Social History Main Topics  . Smoking status: Former Smoker    Quit date: 11/25/2011  . Smokeless tobacco: Never Used  . Alcohol Use: No  . Drug Use: Not on file  . Sexual Activity: Not on file   Other Topics Concern  . Not on file   Social History Narrative  . No narrative on file    No family history on file.   Review of Systems:  See HPI for pertinent ROS. All other ROS negative.    Physical Exam: Blood pressure 122/78, pulse 80, temperature 98.6 F (37 C), temperature source Oral,  resp. rate 18, height 5\' 8"  (1.727 m), weight 218 lb (98.884 kg)., Body mass index is 33.15 kg/(m^2). General: Overweight white male . Appears in no acute distress.  Neck: Supple. No thyromegaly. No lymphadenopathy. Lungs: Clear bilaterally to auscultation without wheezes, rales, or rhonchi. Breathing is unlabored. Heart: RRR with S1 S2. No murmurs, rubs, or gallops. Musculoskeletal:  Strength and tone normal for age. Tenderness with palpation of bilateral sides of his lower back at approximate L4-L5 level bilaterally. 5 over 5 strength of bilateral legs and with dorsiflexion. Extremities/Skin: Warm and dry. Neuro: Alert and oriented X 3. Moves all extremities spontaneously. Gait is normal. CNII-XII grossly in tact. Psych:  Responds to questions appropriately with a normal affect.     ASSESSMENT AND PLAN:  43 y.o. year old male with  1. BACK PAIN - cyclobenzaprine (FLEXERIL) 10 MG tablet; Take 1 tablet (10 mg total) by mouth 3 (three) times daily as needed for muscle spasms.  Dispense: 60 tablet; Refill: 3 - HYDROcodone-acetaminophen (NORCO/VICODIN) 5-325 MG per tablet; Take 1 tablet by mouth every 6 (six) hours as needed.  Dispense: 30 tablet; Refill: 0 - DG Lumbar Spine Complete; Future  Will obtain x-ray. I will follow up with him once we get results of x-ray. Cautioned him that the Flexeril may cause drowsiness. If this is the case, just take it at night but otherwise is no drowsiness and take it 3 times daily. Cautioned him that the hydrocodone may cause impairment and not to take prior to driving and operating machinery. Only take this for severe pain. He says that actually this episode of pain has almost resolved and he really doesn't even need any pain medicine for this episode but wants to have some pain medicine on hand for future episodes.   [address] [place], [place], [facility] 06/23/2014 10:33 AM

## 2014-06-28 ENCOUNTER — Emergency Department (HOSPITAL_COMMUNITY)
Admission: EM | Admit: 2014-06-28 | Discharge: 2014-06-28 | Disposition: A | Discharge status: Home / Self Care | Payer: Medicare Other | Attending: Emergency Medicine | Admitting: Emergency Medicine

## 2014-06-28 ENCOUNTER — Encounter (HOSPITAL_COMMUNITY): Payer: Self-pay | Admitting: Emergency Medicine

## 2014-06-28 DIAGNOSIS — B029 Zoster without complications: Secondary | ICD-10-CM | POA: Diagnosis not present

## 2014-06-28 DIAGNOSIS — Z8719 Personal history of other diseases of the digestive system: Secondary | ICD-10-CM | POA: Diagnosis not present

## 2014-06-28 DIAGNOSIS — L2089 Other atopic dermatitis: Secondary | ICD-10-CM | POA: Diagnosis not present

## 2014-06-28 DIAGNOSIS — IMO0002 Reserved for concepts with insufficient information to code with codable children: Secondary | ICD-10-CM | POA: Diagnosis not present

## 2014-06-28 DIAGNOSIS — J45909 Unspecified asthma, uncomplicated: Secondary | ICD-10-CM | POA: Insufficient documentation

## 2014-06-28 DIAGNOSIS — G8929 Other chronic pain: Secondary | ICD-10-CM | POA: Insufficient documentation

## 2014-06-28 DIAGNOSIS — R21 Rash and other nonspecific skin eruption: Secondary | ICD-10-CM | POA: Insufficient documentation

## 2014-06-28 DIAGNOSIS — Z79899 Other long term (current) drug therapy: Secondary | ICD-10-CM | POA: Insufficient documentation

## 2014-06-28 DIAGNOSIS — Z87891 Personal history of nicotine dependence: Secondary | ICD-10-CM | POA: Insufficient documentation

## 2014-06-28 MED ORDER — ACYCLOVIR 400 MG PO TABS: 800.0000 mg | ORAL_TABLET | Freq: Every day | ORAL | Status: DC

## 2014-06-28 MED ORDER — TETRACAINE HCL 0.5 % OP SOLN
2.0000 [drp] | Freq: Once | OPHTHALMIC | Status: AC
  Administered 2014-06-28: 2 [drp] via OPHTHALMIC
  Filled 2014-06-28: qty 2

## 2014-06-28 MED ORDER — FLUORESCEIN SODIUM 1 MG OP STRP
1.0000 | ORAL_STRIP | Freq: Once | OPHTHALMIC | Status: AC
  Administered 2014-06-28: 1 via OPHTHALMIC
  Filled 2014-06-28: qty 1

## 2014-06-28 NOTE — ED Provider Notes (Signed)
CSN: [id number]     Arrival date & time 06/28/14  1819 History   None    This chart was scribed for non-physician practitioner, Abigail Butts PA-C working with [place], DO by Forrestine Him, ED Scribe. This patient was seen in room TR06C/TR06C and the patient's care was started at 8:16 PM.   Chief Complaint  Patient presents with  . Rash   The history is provided by the patient. No language interpreter was used.    HPI Comments: Adam Lozano is a 43 y.o. male with a PMHx of asthma, atopic dermatitis and GERD who presents to the Emergency Department complaining of a mildly painful rash to the R eye x 48 hours. He denies any itching to the area, typical with his atopic dermatitis. No previous zoster vaccinations. He denies any otalgia or eye pain. However, he admits to itching to his eyes bilaterally. Pt has a standing order for Prednisone given for history of atopic dermatitis. Last dose of 9/1. Pt with known allergies to peanut-containing drug products. No other concerns this visit.  Past Medical History  Diagnosis Date  . Asthma   . GERD (gastroesophageal reflux disease)   . Low back pain     Chronic  . Atopic dermatitis    Past Surgical History  Procedure Laterality Date  . Eye surgery  02/24/14/15    cataracts rt eye   History reviewed. No pertinent family history. History  Substance Use Topics  . Smoking status: Former Smoker    Quit date: 11/25/2011  . Smokeless tobacco: Never Used  . Alcohol Use: No    Review of Systems  Constitutional: Negative for fever, chills, diaphoresis, appetite change, fatigue and unexpected weight change.  HENT: Negative for ear pain and mouth sores.   Eyes: Negative for visual disturbance.  Respiratory: Negative for cough, chest tightness, shortness of breath and wheezing.   Cardiovascular: Negative for chest pain.  Gastrointestinal: Negative for nausea, vomiting, abdominal pain, diarrhea and constipation.  Endocrine: Negative for  polydipsia, polyphagia and polyuria.  Genitourinary: Negative for dysuria, urgency, frequency and hematuria.  Musculoskeletal: Negative for back pain and neck stiffness.  Skin: Positive for rash.  Allergic/Immunologic: Negative for immunocompromised state.  Neurological: Negative for syncope, light-headedness and headaches.  Hematological: Does not bruise/bleed easily.  Psychiatric/Behavioral: Negative for sleep disturbance. The patient is not nervous/anxious.       Allergies  Peanut-containing drug products  Home Medications   Prior to Admission medications   Medication Sig Start Date End Date Taking? Authorizing Provider  Artificial Tear Ointment (ARTIFICIAL TEARS) ointment Place 1 drop into both eyes daily.    Yes Historical Provider, MD  budesonide-formoterol (SYMBICORT) 160-4.5 MCG/ACT inhaler Inhale 2 puffs into the lungs 2 (two) times daily.   Yes Historical Provider, MD  carboxymethylcellulose (REFRESH PLUS) 0.5 % SOLN Place 1 drop into both eyes 2 (two) times daily.    Yes Historical Provider, MD  cetirizine (ZYRTEC) 10 MG tablet Take 10 mg by mouth daily.   Yes Historical Provider, MD  clobetasol cream (TEMOVATE) 5.18 % Apply 1 application topically 2 (two) times daily.    Yes Historical Provider, MD  cyclobenzaprine (FLEXERIL) 10 MG tablet Take 10 mg by mouth 3 (three) times daily as needed for muscle spasms. 06/23/14  Yes Orlena Sheldon, PA-C  fish oil-omega-3 fatty acids 1000 MG capsule Take 2 g by mouth daily.   Yes Historical Provider, MD  folic acid (FOLVITE) 841 MCG tablet Take 400 mcg by mouth  daily.   Yes Historical Provider, MD  HYDROcodone-acetaminophen (NORCO/VICODIN) 5-325 MG per tablet Take 1 tablet by mouth every 6 (six) hours as needed for moderate pain. 06/23/14  Yes Orlena Sheldon, PA-C  hydrocortisone valerate cream (WESTCORT) 0.2 % Apply 1 application topically daily.  01/22/13  Yes Historical Provider, MD  Ipratropium-Albuterol (COMBIVENT RESPIMAT) 20-100 MCG/ACT  AERS respimat INHALE 1 PUFF BY MOUTH FOUR TIMES DAILY, MAX OF 6 PUFFS PER DAY 03/25/14  Yes Orlena Sheldon, PA-C  methotrexate (RHEUMATREX) 2.5 MG tablet Take 10 mg by mouth once a week. Caution:Chemotherapy. Protect from light. Take on sundays   Yes Historical Provider, MD  montelukast (SINGULAIR) 10 MG tablet Take 10 mg by mouth daily.   Yes Historical Provider, MD  NONFORMULARY OR COMPOUNDED ITEM Place 1 application into both eyes 2 (two) times daily as needed (Dexamethasone Phosphate 0.05%).   Yes Historical Provider, MD  pimecrolimus (ELIDEL) 1 % cream Apply topically 2 (two) times daily.   Yes Historical Provider, MD  acyclovir (ZOVIRAX) 400 MG tablet Take 2 tablets (800 mg total) by mouth 5 (five) times daily. 06/28/14   Jermiyah Ricotta, PA-C   Triage Vitals: BP 125/90  Pulse 95  Temp(Src) 97.7 F (36.5 C) (Oral)  Resp 16  SpO2 98%   Physical Exam  Nursing note and vitals reviewed. Constitutional: He appears well-developed and well-nourished. No distress.  Awake, alert, nontoxic appearance  HENT:  Head: Normocephalic and atraumatic.  Mouth/Throat: Oropharynx is clear and moist. No oropharyngeal exudate.  Grouped vesicular lesions located on R side of forehead below the R eye and beginning to erupt of R cheek Mild swelling of periorbital tissue without erythema No lesions noted to ear canal or on the nose  Eyes: Conjunctivae, EOM and lids are normal. Pupils are equal, round, and reactive to light. Lids are everted and swept, no foreign bodies found. Right eye exhibits no chemosis, no discharge and no exudate. No foreign body present in the right eye. Left eye exhibits no chemosis, no discharge and no exudate. No foreign body present in the left eye. Right conjunctiva is not injected. Right conjunctiva has no hemorrhage. Left conjunctiva is not injected. Left conjunctiva has no hemorrhage. No scleral icterus.  Fundoscopic exam:      The right eye shows no exudate and no hemorrhage.        The left eye shows no exudate and no hemorrhage.  Slit lamp exam:      The right eye shows no corneal abrasion, no corneal flare, no corneal ulcer, no fluorescein uptake and no anterior chamber bulge.       The left eye shows no anterior chamber bulge.  Chronic blepharitis bilaterally  Neck: Normal range of motion. Neck supple.  Cardiovascular: Normal rate, regular rhythm, normal heart sounds and intact distal pulses.   Pulmonary/Chest: Effort normal and breath sounds normal. No respiratory distress. He has no wheezes.  Papular raised and excoriated rash over the entirety of the chest consistent with atopic dermatitis  Abdominal: Soft. Bowel sounds are normal. He exhibits no mass. There is no tenderness. There is no rebound and no guarding.  Musculoskeletal: Normal range of motion. He exhibits no edema.  Neurological: He is alert.  Speech is clear and goal oriented Moves extremities without ataxia  Skin: Skin is warm and dry. He is not diaphoretic.  Psychiatric: He has a normal mood and affect.    ED Course  Procedures (including critical care time)  DIAGNOSTIC STUDIES: Oxygen Saturation is  98% on RA, Normal by my interpretation.    COORDINATION OF CARE: 8:16 PM- Discussed treatment plan with pt at bedside and pt agreed to plan.     Labs Review Labs Reviewed - No data to display  Imaging Review No results found.   EKG Interpretation None      MDM   Final diagnoses:  Shingles rash  DERMATITIS, OTHER ATOPIC   GADGE HERMIZ presents with a rash to his right forehead and under his right eye appearing within 48 hours.  Patient reports a history of atopic dermatitis but reports that the rash on his face is different from usual.  His a history of chickenpox as a child but has never had shingles. No vision changes, photophobia or feeling of foreign body in his right eye.  Rash is tender with grouped clear vesicles on an erythematous base located along dermatome of them  trigeminal nerve unilaterally on the right side of the face.  Pt without signs of CNS involvement and there is no involvement of the eyes (no dendritic staining on fluorescein exam); no concern for opthalmic zoster.  Will discharge home with acyclovir.  Also recommend calamine lotion and cool compresses as needed for pain control.  Patient is to followup with his primary care provider within 3 days. He's to return to the emergency room or seek attention from an ophthalmologist immediately if he begins to have pain, vision changes or feeling of foreign body in the right eye.  No evidence of Ramsay Hunt syndrome or herpes ophthalmicus today.  BP 125/90  Pulse 95  Temp(Src) 97.7 F (36.5 C) (Oral)  Resp 16  SpO2 98%  I personally performed the services described in this documentation, which was scribed in my presence. The recorded information has been reviewed and is accurate.    Jarrett Soho Apostolos Blagg, PA-C 06/28/14 2017

## 2014-06-28 NOTE — ED Notes (Signed)
Pt reports having raised rash area under right eye x 48 hrs, no itching but is having pain. Denies vision changes.

## 2014-06-28 NOTE — Discharge Instructions (Signed)
1. Medications: acyclovir, usual home medications 2. Treatment: rest, drink plenty of fluids, do not rub eyes 3. Follow Up: Please followup with your primary doctor in 3 days for discussion of your diagnoses and further evaluation after today's visit; return to the emergency department or see your ophthalmologist if you begin to have changes in vision or discomfort in your eye.  Shingles Shingles (herpes zoster) is an infection that is caused by the same virus that causes chickenpox (varicella). The infection causes a painful skin rash and fluid-filled blisters, which eventually break open, crust over, and heal. It may occur in any area of the body, but it usually affects only one side of the body or face. The pain of shingles usually lasts about 1 month. However, some people with shingles may develop long-term (chronic) pain in the affected area of the body. Shingles often occurs many years after the person had chickenpox. It is more common:  In people older than 50 years.  In people with weakened immune systems, such as those with HIV, AIDS, or cancer.  In people taking medicines that weaken the immune system, such as transplant medicines.  In people under great stress. CAUSES  Shingles is caused by the varicella zoster virus (VZV), which also causes chickenpox. After a person is infected with the virus, it can remain in the person's body for years in an inactive state (dormant). To cause shingles, the virus reactivates and breaks out as an infection in a nerve root. The virus can be spread from person to person (contagious) through contact with open blisters of the shingles rash. It will only spread to people who have not had chickenpox. When these people are exposed to the virus, they may develop chickenpox. They will not develop shingles. Once the blisters scab over, the person is no longer contagious and cannot spread the virus to others. SIGNS AND SYMPTOMS  Shingles shows up in stages. The  initial symptoms may be pain, itching, and tingling in an area of the skin. This pain is usually described as burning, stabbing, or throbbing.In a few days or weeks, a painful red rash will appear in the area where the pain, itching, and tingling were felt. The rash is usually on one side of the body in a band or belt-like pattern. Then, the rash usually turns into fluid-filled blisters. They will scab over and dry up in approximately 2-3 weeks. Flu-like symptoms may also occur with the initial symptoms, the rash, or the blisters. These may include:  Fever.  Chills.  Headache.  Upset stomach. DIAGNOSIS  Your health care provider will perform a skin exam to diagnose shingles. Skin scrapings or fluid samples may also be taken from the blisters. This sample will be examined under a microscope or sent to a lab for further testing. TREATMENT  There is no specific cure for shingles. Your health care provider will likely prescribe medicines to help you manage the pain, recover faster, and avoid long-term problems. This may include antiviral drugs, anti-inflammatory drugs, and pain medicines. HOME CARE INSTRUCTIONS   Take a cool bath or apply cool compresses to the area of the rash or blisters as directed. This may help with the pain and itching.   Take medicines only as directed by your health care provider.   Rest as directed by your health care provider.  Keep your rash and blisters clean with mild soap and cool water or as directed by your health care provider.  Do not pick your blisters or  scratch your rash. Apply an anti-itch cream or numbing creams to the affected area as directed by your health care provider.  Keep your shingles rash covered with a loose bandage (dressing).  Avoid skin contact with:  Babies.   Pregnant women.   Children with eczema.   Elderly people with transplants.   People with chronic illnesses, such as leukemia or AIDS.   Wear loose-fitting  clothing to help ease the pain of material rubbing against the rash.  Keep all follow-up visits as directed by your health care provider.If the area involved is on your face, you may receive a referral for a specialist, such as an eye doctor (ophthalmologist) or an ear, nose, and throat (ENT) doctor. Keeping all follow-up visits will help you avoid eye problems, chronic pain, or disability.  SEEK IMMEDIATE MEDICAL CARE IF:   You have facial pain, pain around the eye area, or loss of feeling on one side of your face.  You have ear pain or ringing in your ear.  You have loss of taste.  Your pain is not relieved with prescribed medicines.   Your redness or swelling spreads.   You have more pain and swelling.  Your condition is worsening or has changed.   You have a fever. MAKE SURE YOU:  Understand these instructions.  Will watch your condition.  Will get help right away if you are not doing well or get worse. Document Released: 10/10/2005 Document Revised: 02/24/2014 Document Reviewed: 05/24/2012 [facility] Patient Information 2015 [place], [place]. This information is not intended to replace advice given to you by your health care provider. Make sure you discuss any questions you have with your health care provider.

## 2014-06-28 NOTE — ED Provider Notes (Signed)
Medical screening examination/treatment/procedure(s) were performed by non-physician practitioner and as supervising physician I was immediately available for consultation/collaboration.   EKG Interpretation None        [place], DO 06/28/14 2318

## 2014-07-07 ENCOUNTER — Ambulatory Visit (INDEPENDENT_AMBULATORY_CARE_PROVIDER_SITE_OTHER): Payer: Medicare Other | Admitting: Physician Assistant

## 2014-07-07 ENCOUNTER — Encounter: Payer: Self-pay | Admitting: Physician Assistant

## 2014-07-07 VITALS — BP 128/74 | HR 84 | Temp 97.5°F | Ht 65.0 in | Wt 217.0 lb

## 2014-07-07 DIAGNOSIS — B029 Zoster without complications: Secondary | ICD-10-CM

## 2014-07-07 NOTE — Progress Notes (Signed)
Patient ID: Adam Lozano MRN: [id number], DOB: 02/10/1971, 43 y.o. Date of Encounter: 07/07/2014, 10:10 AM    Chief Complaint:  Chief Complaint  Patient presents with  . Herpes Zoster    per patient-  doing better now.     HPI: 43 y.o. year old white male says he recently went to the ER and was diagnosed with herpes zoster/shingles. Since he had blistery-type lesions above and below his right eye. Says the ER doctor used a black light and examined his eye and said that the herpes zoster was not involving the eye itself. Says he has had no blurred vision or other vision changes and has had no pain in the eye itself. Acyclovir is the only medication prescribed by the ER. He says he has completed the acyclovir. Rash has resolved and he says the pain has also resolved.     Home Meds:   Outpatient Prescriptions Prior to Visit  Medication Sig Dispense Refill  . Artificial Tear Ointment (ARTIFICIAL TEARS) ointment Place 1 drop into both eyes daily.       . budesonide-formoterol (SYMBICORT) 160-4.5 MCG/ACT inhaler Inhale 2 puffs into the lungs 2 (two) times daily.      . carboxymethylcellulose (REFRESH PLUS) 0.5 % SOLN Place 1 drop into both eyes 2 (two) times daily.       . cetirizine (ZYRTEC) 10 MG tablet Take 10 mg by mouth daily.      . clobetasol cream (TEMOVATE) 9.23 % Apply 1 application topically 2 (two) times daily.       . cyclobenzaprine (FLEXERIL) 10 MG tablet Take 10 mg by mouth 3 (three) times daily as needed for muscle spasms.      . fish oil-omega-3 fatty acids 1000 MG capsule Take 2 g by mouth daily.      . folic acid (FOLVITE) 300 MCG tablet Take 400 mcg by mouth daily.      Marland Kitchen HYDROcodone-acetaminophen (NORCO/VICODIN) 5-325 MG per tablet Take 1 tablet by mouth every 6 (six) hours as needed for moderate pain.      . hydrocortisone valerate cream (WESTCORT) 0.2 % Apply 1 application topically daily.       . Ipratropium-Albuterol (COMBIVENT RESPIMAT) 20-100 MCG/ACT AERS  respimat INHALE 1 PUFF BY MOUTH FOUR TIMES DAILY, MAX OF 6 PUFFS PER DAY      . methotrexate (RHEUMATREX) 2.5 MG tablet Take 10 mg by mouth once a week. Caution:Chemotherapy. Protect from light. Take on sundays      . montelukast (SINGULAIR) 10 MG tablet Take 10 mg by mouth daily.      . NONFORMULARY OR COMPOUNDED ITEM Place 1 application into both eyes 2 (two) times daily as needed (Dexamethasone Phosphate 0.05%).      . pimecrolimus (ELIDEL) 1 % cream Apply topically 2 (two) times daily.      Marland Kitchen acyclovir (ZOVIRAX) 400 MG tablet Take 2 tablets (800 mg total) by mouth 5 (five) times daily.  50 tablet  0   No facility-administered medications prior to visit.    Allergies:  Allergies  Allergen Reactions  . Peanut-Containing Drug Products     hives      Review of Systems: See HPI for pertinent ROS. All other ROS negative.    Physical Exam: Blood pressure 128/74, pulse 84, temperature 97.5 F (36.4 C), temperature source Oral, height 5\' 5"  (1.651 m), weight 217 lb (98.431 kg)., Body mass index is 36.11 kg/(m^2). General: WM.  Appears in no acute distress. Neck: Supple.  No thyromegaly. No lymphadenopathy. Lungs: Clear bilaterally to auscultation without wheezes, rales, or rhonchi. Breathing is unlabored. Heart: Regular rhythm. No murmurs, rubs, or gallops. Abdomen: Soft, non-tender, non-distended with normoactive bowel sounds. No hepatomegaly. No rebound/guarding. No obvious abdominal masses. Msk:  Strength and tone normal for age. Skin: He has chronic severe atopic dermatitis. He has findings consistent with this but has no findings consistant with herpes zoster at this time. Neuro: Alert and oriented X 3. Moves all extremities spontaneously. Gait is normal. CNII-XII grossly in tact. Psych:  Responds to questions appropriately with a normal affect.     ASSESSMENT AND PLAN:  43 y.o. year old male with  1. Herpes zoster He has completed a course of acyclovir. Rash has resolved and  pain has resolved that no further treatment needed at this time.   Signed, [address] [place], [place], [facility] 07/07/2014 10:10 AM

## 2014-07-13 ENCOUNTER — Other Ambulatory Visit: Payer: Self-pay | Admitting: Physician Assistant

## 2014-07-14 NOTE — Telephone Encounter (Signed)
Medication refilled per protocol. 

## 2014-07-30 DIAGNOSIS — H1013 Acute atopic conjunctivitis, bilateral: Secondary | ICD-10-CM | POA: Diagnosis not present

## 2014-07-30 DIAGNOSIS — H18601 Keratoconus, unspecified, right eye: Secondary | ICD-10-CM | POA: Diagnosis not present

## 2014-07-30 DIAGNOSIS — H1859 Other hereditary corneal dystrophies: Secondary | ICD-10-CM | POA: Diagnosis not present

## 2014-07-30 DIAGNOSIS — Z961 Presence of intraocular lens: Secondary | ICD-10-CM | POA: Diagnosis not present

## 2014-07-30 DIAGNOSIS — H10413 Chronic giant papillary conjunctivitis, bilateral: Secondary | ICD-10-CM | POA: Diagnosis not present

## 2014-08-05 DIAGNOSIS — Z23 Encounter for immunization: Secondary | ICD-10-CM | POA: Diagnosis not present

## 2014-08-13 ENCOUNTER — Telehealth: Payer: Self-pay | Admitting: *Deleted

## 2014-08-13 DIAGNOSIS — R21 Rash and other nonspecific skin eruption: Secondary | ICD-10-CM

## 2014-08-13 NOTE — Telephone Encounter (Signed)
Yes. Approved. 

## 2014-08-13 NOTE — Telephone Encounter (Signed)
Pt called wanting a referral to see a Dermatologist because of his skin issues, wants to go see Dr. Jarome Matin at [facility]. ? Ok to do referral

## 2014-08-15 DIAGNOSIS — L300 Nummular dermatitis: Secondary | ICD-10-CM | POA: Diagnosis not present

## 2014-08-15 DIAGNOSIS — L209 Atopic dermatitis, unspecified: Secondary | ICD-10-CM | POA: Diagnosis not present

## 2014-08-15 DIAGNOSIS — Z79899 Other long term (current) drug therapy: Secondary | ICD-10-CM | POA: Diagnosis not present

## 2014-09-24 DIAGNOSIS — H10413 Chronic giant papillary conjunctivitis, bilateral: Secondary | ICD-10-CM | POA: Diagnosis not present

## 2014-09-24 DIAGNOSIS — H1013 Acute atopic conjunctivitis, bilateral: Secondary | ICD-10-CM | POA: Diagnosis not present

## 2014-09-24 DIAGNOSIS — H18603 Keratoconus, unspecified, bilateral: Secondary | ICD-10-CM | POA: Diagnosis not present

## 2014-10-01 ENCOUNTER — Encounter: Payer: Self-pay | Admitting: Physician Assistant

## 2014-10-01 ENCOUNTER — Ambulatory Visit (INDEPENDENT_AMBULATORY_CARE_PROVIDER_SITE_OTHER): Payer: Medicare Other | Admitting: Physician Assistant

## 2014-10-01 VITALS — BP 132/86 | HR 88 | Temp 97.8°F | Resp 18 | Wt 217.0 lb

## 2014-10-01 DIAGNOSIS — G2581 Restless legs syndrome: Secondary | ICD-10-CM

## 2014-10-01 DIAGNOSIS — R739 Hyperglycemia, unspecified: Secondary | ICD-10-CM | POA: Diagnosis not present

## 2014-10-01 DIAGNOSIS — R7309 Other abnormal glucose: Secondary | ICD-10-CM | POA: Diagnosis not present

## 2014-10-01 LAB — HEMOGLOBIN A1C, FINGERSTICK: Hgb A1C (fingerstick): 6 % — ABNORMAL HIGH (ref <5.7)

## 2014-10-01 NOTE — Progress Notes (Signed)
Patient ID: Adam Lozano MRN: [id number], DOB: 01-18-1971, 43 y.o. Date of Encounter: 10/01/2014, 8:22 AM    Chief Complaint:  Chief Complaint  Patient presents with  . 6 mth check up    is fasting     HPI: 43 y.o. year old white male for 6 month followup visit.   Here for a routine six-month followup regarding history of hyperglycemia. At office visit 03/2014 he reported that he had cut back on sodas and carb intake. Today 09/2014 he says that he has definitely decreased intake of fast food a lot. Says he does rarely drinks soda but says that he is drinking sweet tea. Says that he is trying to eat smaller portions and has decreased bread intake. Asked if he still has his handout regarding low carbohydrate diet and he says "you better give me another one"--so I did give him another handout again today 09/2014.  At his OV 03/2014 he pulled up his shirt and showed me the significant dermatitis he currently had. As well, he showed me his notepad where he has documented notes which includes recent prednisone.  today 09/2014 he has a Building services engineer document where he is keeping records of prednisone dose.  Since 06/2014 he has been on either prednisone 20 mg or 10 mg depending on the amount of dermatitis at different times.  He says that this is being prescribed by his dermatologist and managed by dermatology.  No other complaints or concerns today.  Home Meds:   Outpatient Prescriptions Prior to Visit  Medication Sig Dispense Refill  . Artificial Tear Ointment (ARTIFICIAL TEARS) ointment Place 1 drop into both eyes daily.     . budesonide-formoterol (SYMBICORT) 160-4.5 MCG/ACT inhaler Inhale 2 puffs into the lungs 2 (two) times daily.    . carboxymethylcellulose (REFRESH PLUS) 0.5 % SOLN Place 1 drop into both eyes 2 (two) times daily.     . cetirizine (ZYRTEC) 10 MG tablet Take 10 mg by mouth daily.    . clobetasol cream (TEMOVATE) 7.65 % Apply 1 application topically 2  (two) times daily.     . cyclobenzaprine (FLEXERIL) 10 MG tablet Take 10 mg by mouth 3 (three) times daily as needed for muscle spasms.    . fish oil-omega-3 fatty acids 1000 MG capsule Take 2 g by mouth daily.    . folic acid (FOLVITE) 465 MCG tablet Take 400 mcg by mouth daily.    Marland Kitchen HYDROcodone-acetaminophen (NORCO/VICODIN) 5-325 MG per tablet Take 1 tablet by mouth every 6 (six) hours as needed for moderate pain.    . hydrocortisone valerate cream (WESTCORT) 0.2 % Apply 1 application topically daily.     . Ipratropium-Albuterol (COMBIVENT RESPIMAT) 20-100 MCG/ACT AERS respimat INHALE 1 PUFF BY MOUTH FOUR TIMES DAILY, MAX OF 6 PUFFS PER DAY    . methotrexate (RHEUMATREX) 2.5 MG tablet Take 10 mg by mouth once a week. Caution:Chemotherapy. Protect from light. Take on sundays    . montelukast (SINGULAIR) 10 MG tablet Take 10 mg by mouth daily.    . NONFORMULARY OR COMPOUNDED ITEM Place 1 application into both eyes 2 (two) times daily as needed (Dexamethasone Phosphate 0.05%).    . pimecrolimus (ELIDEL) 1 % cream Apply topically 2 (two) times daily.    . COMBIVENT RESPIMAT 20-100 MCG/ACT AERS respimat INHALE 1 PUFF BY MOUTH FOUR TIMES DAILY. MAX OF 6 PUFFS PER DAY 4 g 6   No facility-administered medications prior to visit.    Allergies:  Allergies  Allergen Reactions  . Peanut-Containing Drug Products     hives      Review of Systems: See HPI for pertinent ROS. All other ROS negative.    Physical Exam: Blood pressure 132/86, pulse 88, temperature 97.8 F (36.6 C), temperature source Oral, resp. rate 18, weight 217 lb (98.431 kg)., Body mass index is 36.11 kg/(m^2). General: Overweight WM with moderate abdominal obesity.  Appears in no acute distress. Neck: Supple. No thyromegaly. No lymphadenopathy. No carotid bruit. Lungs: Clear bilaterally to auscultation without wheezes, rales, or rhonchi. Breathing is unlabored. Heart: Regular rhythm. No murmurs, rubs, or  gallops. Extremities/Skin: Warm and dry. Signficant Dermatitis, managed by specialists.  Neuro: Alert and oriented X 3. Moves all extremities spontaneously. Gait is normal. CNII-XII grossly in tact. Psych:  Responds to questions appropriately with a normal affect.     ASSESSMENT AND PLAN:  43 y.o. year old male with  1. GLUCOSE INTOLERANCE --Frequent Prednisone use - Hemoglobin A1C, fingerstick  If A1c remains stable can wait 6 months for follow-up office visit. I'll up sooner if needed.  Signed, [address] [place], [place], [facility] 10/01/2014 8:22 AM

## 2014-10-08 ENCOUNTER — Encounter: Payer: Self-pay | Admitting: Family Medicine

## 2014-11-27 ENCOUNTER — Telehealth: Payer: Self-pay | Admitting: Physician Assistant

## 2014-11-27 MED ORDER — HYDROCODONE-ACETAMINOPHEN 5-325 MG PO TABS: 1.0000 | ORAL_TABLET | Freq: Four times a day (QID) | ORAL | Status: DC | PRN

## 2014-11-27 NOTE — Telephone Encounter (Signed)
LRF 06/23/14 #30 + 0.  LOV 10/01/14.  OK refill?

## 2014-11-27 NOTE — Telephone Encounter (Signed)
Approved for #30+0 

## 2014-11-27 NOTE — Telephone Encounter (Signed)
Patient is calling to refill on hydrocodone  [phone] (M)

## 2014-11-27 NOTE — Telephone Encounter (Signed)
rx ready and pt aware 

## 2014-12-09 ENCOUNTER — Other Ambulatory Visit: Payer: Self-pay | Admitting: Physician Assistant

## 2014-12-09 NOTE — Telephone Encounter (Signed)
Medication refilled per protocol. 

## 2014-12-10 ENCOUNTER — Other Ambulatory Visit: Payer: Self-pay | Admitting: Physician Assistant

## 2014-12-10 NOTE — Telephone Encounter (Signed)
Medication refilled per protocol. 

## 2014-12-16 DIAGNOSIS — Z79899 Other long term (current) drug therapy: Secondary | ICD-10-CM | POA: Diagnosis not present

## 2014-12-16 DIAGNOSIS — L2089 Other atopic dermatitis: Secondary | ICD-10-CM | POA: Diagnosis not present

## 2014-12-19 ENCOUNTER — Other Ambulatory Visit: Payer: Self-pay | Admitting: Physician Assistant

## 2014-12-22 NOTE — Telephone Encounter (Signed)
LRF 06/23/14  #60 + 3.  LOV 10/01/14  OK refill?

## 2014-12-22 NOTE — Telephone Encounter (Signed)
Approved #60+3 additional refills

## 2014-12-22 NOTE — Telephone Encounter (Signed)
rx called in

## 2014-12-27 ENCOUNTER — Other Ambulatory Visit: Payer: Self-pay | Admitting: Physician Assistant

## 2014-12-29 NOTE — Telephone Encounter (Signed)
Medication refilled per protocol. 

## 2014-12-31 ENCOUNTER — Telehealth: Payer: Self-pay | Admitting: Family Medicine

## 2014-12-31 ENCOUNTER — Encounter: Payer: Self-pay | Admitting: Physician Assistant

## 2014-12-31 DIAGNOSIS — H01134 Eczematous dermatitis of left upper eyelid: Secondary | ICD-10-CM | POA: Diagnosis not present

## 2014-12-31 DIAGNOSIS — H01135 Eczematous dermatitis of left lower eyelid: Secondary | ICD-10-CM | POA: Diagnosis not present

## 2014-12-31 DIAGNOSIS — H10413 Chronic giant papillary conjunctivitis, bilateral: Secondary | ICD-10-CM | POA: Diagnosis not present

## 2014-12-31 DIAGNOSIS — Z961 Presence of intraocular lens: Secondary | ICD-10-CM | POA: Diagnosis not present

## 2014-12-31 MED ORDER — TIZANIDINE HCL 4 MG PO TABS: 4.0000 mg | ORAL_TABLET | Freq: Four times a day (QID) | ORAL | Status: DC | PRN

## 2014-12-31 NOTE — Telephone Encounter (Signed)
Rec'd letter for patient's insurance.  His Cyclobenzaprine (flexeril) is not on the formulary.  They request he try an alternative medication on the formulary.  Per provider switch patient to Tizanidine 4 mg.  I called patient, he also was contacted by insurance.  Explained situation and provider recommendations.  New Rx to pharmacy.

## 2015-01-18 ENCOUNTER — Other Ambulatory Visit: Payer: Self-pay | Admitting: Physician Assistant

## 2015-01-19 ENCOUNTER — Other Ambulatory Visit: Payer: Self-pay | Admitting: Physician Assistant

## 2015-01-19 NOTE — Telephone Encounter (Signed)
Medication refilled per protocol. 

## 2015-01-20 ENCOUNTER — Other Ambulatory Visit: Payer: Self-pay | Admitting: Physician Assistant

## 2015-01-20 NOTE — Telephone Encounter (Signed)
Medication refilled per protocol. 

## 2015-01-24 ENCOUNTER — Other Ambulatory Visit: Payer: Self-pay | Admitting: Physician Assistant

## 2015-01-26 NOTE — Telephone Encounter (Signed)
Medication refilled per protocol. 

## 2015-02-13 ENCOUNTER — Telehealth: Payer: Self-pay | Admitting: *Deleted

## 2015-02-13 NOTE — Telephone Encounter (Signed)
Refill on Hydrocodone 5-325mg    LRF:11/27/14  LOV 10/01/14

## 2015-02-16 ENCOUNTER — Telehealth: Payer: Self-pay | Admitting: Physician Assistant

## 2015-02-16 MED ORDER — HYDROCODONE-ACETAMINOPHEN 5-325 MG PO TABS: 1.0000 | ORAL_TABLET | Freq: Four times a day (QID) | ORAL | Status: DC | PRN

## 2015-02-16 NOTE — Telephone Encounter (Signed)
Patient asking for refill on hydrocodone  [phone]

## 2015-02-16 NOTE — Telephone Encounter (Signed)
Approved. # 30 + 0. 

## 2015-02-16 NOTE — Telephone Encounter (Signed)
LRF 11/27/14 #30  LOV 10/01/14  OK refill?

## 2015-02-16 NOTE — Telephone Encounter (Signed)
rx ready and pt aware 

## 2015-02-16 NOTE — Telephone Encounter (Signed)
I already put a note regarding this-- this morning at 11:36a.m.  Approved for #30+0.

## 2015-03-16 DIAGNOSIS — L821 Other seborrheic keratosis: Secondary | ICD-10-CM | POA: Diagnosis not present

## 2015-03-16 DIAGNOSIS — L400 Psoriasis vulgaris: Secondary | ICD-10-CM | POA: Diagnosis not present

## 2015-03-16 DIAGNOSIS — Z79899 Other long term (current) drug therapy: Secondary | ICD-10-CM | POA: Diagnosis not present

## 2015-04-09 ENCOUNTER — Ambulatory Visit: Payer: Medicare Other | Admitting: Physician Assistant

## 2015-04-16 ENCOUNTER — Ambulatory Visit (INDEPENDENT_AMBULATORY_CARE_PROVIDER_SITE_OTHER): Payer: Medicare Other | Admitting: Physician Assistant

## 2015-04-16 ENCOUNTER — Encounter: Payer: Self-pay | Admitting: Physician Assistant

## 2015-04-16 DIAGNOSIS — R739 Hyperglycemia, unspecified: Secondary | ICD-10-CM

## 2015-04-16 LAB — HEMOGLOBIN A1C, FINGERSTICK: Hgb A1C (fingerstick): 5.8 % — ABNORMAL HIGH (ref <5.7)

## 2015-04-16 NOTE — Progress Notes (Signed)
Patient ID: Adam Lozano MRN: [id number], DOB: Sep 11, 1971, 44 y.o. Date of Encounter: 04/16/2015, 8:31 AM    Chief Complaint:  Chief Complaint  Patient presents with  . Follow-up    6 mos     HPI: 44 y.o. year old white male for 6 month followup visit.   Here for a routine six-month followup regarding history of hyperglycemia. At office visit 03/2014 he reported that he had cut back on sodas and carb intake. Today 09/2014 he says that he has definitely decreased intake of fast food a lot. Says he does rarely drinks soda but says that he is drinking sweet tea. Says that he is trying to eat smaller portions and has decreased bread intake. Asked if he still has his handout regarding low carbohydrate diet and he says "you better give me another one"--so I did give him another handout again today 09/2014.  At [place] 03/2015--says he thought he lost a few pounds since the last visit. We checked and 09/2014 weight was 217. 2016 weight down to 210. Down 10 pounds. Says that since he has moved out towards [facility] he is not as close to fast food as he was. Says that he is also decreased portion sizes. Says he is doing no exercise but these diet changes him are what has gotten his weight loss.  At his OV 03/2014 he pulled up his shirt and showed me the significant dermatitis he currently had. As well, he showed me his notepad where he has documented notes which includes recent prednisone.  At [place] 09/2014 he has a Building services engineer document where he is keeping records of prednisone dose.  Since 06/2014 he has been on either prednisone 20 mg or 10 mg depending on the amount of dermatitis at different times.  He says that this is being prescribed by his dermatologist and managed by dermatology.  At [place] 03/2015 he states that dermatology recently increased his methotrexate from 4 pills to 6 pills and increase his folic acid dose. Says dermatology gives him prednisone to take it his discretion.  Had to take some low-dose 10 mg about 2 weeks ago.  No other complaints or concerns today.  Home Meds:   Outpatient Prescriptions Prior to Visit  Medication Sig Dispense Refill  . Artificial Tear Ointment (ARTIFICIAL TEARS) ointment Place 1 drop into both eyes daily.     . budesonide-formoterol (SYMBICORT) 160-4.5 MCG/ACT inhaler Inhale 2 puffs into the lungs 2 (two) times daily.    . carboxymethylcellulose (REFRESH PLUS) 0.5 % SOLN Place 1 drop into both eyes 2 (two) times daily.     . cetirizine (ZYRTEC) 10 MG tablet Take 10 mg by mouth daily.    . clobetasol cream (TEMOVATE) 9.32 % Apply 1 application topically 2 (two) times daily.     . COMBIVENT RESPIMAT 20-100 MCG/ACT AERS respimat INHALE 1 PUFF 4 TIMES A DAY MAX 6 PUFFS A DAY 4 g 3  . fish oil-omega-3 fatty acids 1000 MG capsule Take 2 g by mouth daily.    . folic acid (FOLVITE) 355 MCG tablet Take 400 mcg by mouth daily.    Marland Kitchen HYDROcodone-acetaminophen (NORCO/VICODIN) 5-325 MG per tablet Take 1 tablet by mouth every 6 (six) hours as needed for moderate pain. 30 tablet 0  . hydrocortisone valerate cream (WESTCORT) 0.2 % Apply 1 application topically daily.     . methotrexate (RHEUMATREX) 2.5 MG tablet Take 10 mg by mouth once a week. Caution:Chemotherapy. Protect from light. Take on  sundays    . montelukast (SINGULAIR) 10 MG tablet TAKE 1 TABLET BY MOUTH EVERY DAY FOR ALLERGIES AND ASTHMA 30 tablet 4  . NONFORMULARY OR COMPOUNDED ITEM Place 1 application into both eyes 2 (two) times daily as needed (Dexamethasone Phosphate 0.05%).    Marland Kitchen PATADAY 0.2 % SOLN Place 1 drop into both eyes daily.    . pimecrolimus (ELIDEL) 1 % cream Apply topically 2 (two) times daily.    . prednisoLONE acetate (PRED FORTE) 1 % ophthalmic suspension Place 1 drop into both eyes daily.  0  . predniSONE (DELTASONE) 10 MG tablet Take 1 tablet by mouth as directed. Per Dermatologist    . tiZANidine (ZANAFLEX) 4 MG tablet Take 1 tablet (4 mg total) by mouth every 6  (six) hours as needed for muscle spasms. 60 tablet 2   No facility-administered medications prior to visit.    Allergies:  Allergies  Allergen Reactions  . Peanut-Containing Drug Products     hives      Review of Systems: See HPI for pertinent ROS. All other ROS negative.    Physical Exam: Blood pressure 120/80, pulse 78, temperature 97.8 F (36.6 C), temperature source Oral, resp. rate 16, weight 210 lb (95.255 kg)., Body mass index is 34.95 kg/(m^2). General: Overweight WM with moderate abdominal obesity.  Appears in no acute distress. Neck: Supple. No thyromegaly. No lymphadenopathy. No carotid bruit. Lungs: Clear bilaterally to auscultation without wheezes, rales, or rhonchi. Breathing is unlabored. Heart: Regular rhythm. No murmurs, rubs, or gallops. Extremities/Skin: Warm and dry. Signficant Dermatitis, managed by specialists.  Neuro: Alert and oriented X 3. Moves all extremities spontaneously. Gait is normal. CNII-XII grossly in tact. Psych:  Responds to questions appropriately with a normal affect.     ASSESSMENT AND PLAN:  44 y.o. year old male with  1. GLUCOSE INTOLERANCE --Frequent Prednisone use - Hemoglobin A1C, fingerstick  If A1c remains stable can wait 6 months for follow-up office visit. Follow up sooner if needed.  Given his frequent prednisone use would need to remember to do a bone density scan at a younger age than usual.  In wait until age 72 start prostate cancer screening and colon cancer screening.  Will need to check lipid panel at some point.  Immunizations: Has been getting influenza vaccines routinely. He has had Pneumovax 23. Last tetanus vaccine was 03/2005. This is due today. He is agreeable to go ahead and get this today.---WE ARE OUT OF THIS----WILL GIVE AT NEXT OV------------------  Signed, Karis Juba, [place], [facility] 04/16/2015 8:31 AM

## 2015-04-21 ENCOUNTER — Telehealth: Payer: Self-pay | Admitting: Physician Assistant

## 2015-04-21 NOTE — Telephone Encounter (Signed)
[phone] Pt is needing a refill on HYDROcodone-acetaminophen (NORCO/VICODIN) 5-325 MG per tablet

## 2015-04-21 NOTE — Telephone Encounter (Signed)
LRF 02/16/15 #30  LOV 04/16/15  OK refill?

## 2015-04-21 NOTE — Telephone Encounter (Signed)
Approved. # 30 + 0. 

## 2015-04-22 MED ORDER — HYDROCODONE-ACETAMINOPHEN 5-325 MG PO TABS: 1.0000 | ORAL_TABLET | Freq: Four times a day (QID) | ORAL | Status: DC | PRN

## 2015-04-22 NOTE — Telephone Encounter (Signed)
Left pt message Rx ready for pick up

## 2015-05-07 ENCOUNTER — Encounter: Payer: Self-pay | Admitting: Family Medicine

## 2015-05-09 ENCOUNTER — Other Ambulatory Visit: Payer: Self-pay | Admitting: Physician Assistant

## 2015-05-12 NOTE — Telephone Encounter (Signed)
Medication refilled per protocol. 

## 2015-05-18 DIAGNOSIS — H10413 Chronic giant papillary conjunctivitis, bilateral: Secondary | ICD-10-CM | POA: Diagnosis not present

## 2015-05-18 DIAGNOSIS — Z961 Presence of intraocular lens: Secondary | ICD-10-CM | POA: Diagnosis not present

## 2015-05-18 DIAGNOSIS — H01139 Eczematous dermatitis of unspecified eye, unspecified eyelid: Secondary | ICD-10-CM | POA: Diagnosis not present

## 2015-05-18 DIAGNOSIS — H18602 Keratoconus, unspecified, left eye: Secondary | ICD-10-CM | POA: Diagnosis not present

## 2015-06-07 ENCOUNTER — Other Ambulatory Visit: Payer: Self-pay | Admitting: Physician Assistant

## 2015-06-08 NOTE — Telephone Encounter (Signed)
Refill appropriate and filled per protocol. 

## 2015-06-18 ENCOUNTER — Telehealth: Payer: Self-pay | Admitting: *Deleted

## 2015-06-18 MED ORDER — HYDROCODONE-ACETAMINOPHEN 5-325 MG PO TABS: 1.0000 | ORAL_TABLET | Freq: Four times a day (QID) | ORAL | Status: DC | PRN

## 2015-06-18 NOTE — Telephone Encounter (Signed)
Rx is ready, left pt message to pick up

## 2015-06-18 NOTE — Telephone Encounter (Signed)
Approved. # 30 + 0. 

## 2015-06-18 NOTE — Telephone Encounter (Signed)
Pt called wanting refill on Hydrocodone -325mg    Please call pt when ready to pick up

## 2015-06-23 DIAGNOSIS — L2089 Other atopic dermatitis: Secondary | ICD-10-CM | POA: Diagnosis not present

## 2015-06-23 DIAGNOSIS — Z79899 Other long term (current) drug therapy: Secondary | ICD-10-CM | POA: Diagnosis not present

## 2015-07-30 ENCOUNTER — Other Ambulatory Visit: Payer: Self-pay | Admitting: Physician Assistant

## 2015-07-31 NOTE — Telephone Encounter (Signed)
Medication refilled per protocol. 

## 2015-08-18 ENCOUNTER — Other Ambulatory Visit: Payer: Self-pay | Admitting: Physician Assistant

## 2015-08-18 NOTE — Telephone Encounter (Signed)
LRF 06/18/15 330.  LOV 04/16/15  OK refill?

## 2015-08-18 NOTE — Telephone Encounter (Signed)
Pharmacy:pick up  Medication:HYDROcodone-acetaminophen (NORCO/VICODIN) 5-325  Qty:  Sig:  Physician:Mary B. Dixon  Patient's phone number:[phone]

## 2015-08-19 MED ORDER — HYDROCODONE-ACETAMINOPHEN 5-325 MG PO TABS: 1.0000 | ORAL_TABLET | Freq: Four times a day (QID) | ORAL | Status: DC | PRN

## 2015-08-19 NOTE — Telephone Encounter (Signed)
Approved. # 30 + 0. 

## 2015-08-19 NOTE — Telephone Encounter (Signed)
Left pt message that RX ready

## 2015-08-20 ENCOUNTER — Telehealth: Payer: Self-pay | Admitting: Physician Assistant

## 2015-08-20 NOTE — Telephone Encounter (Signed)
Patient is calling for rx for his hydrocodone   [phone]

## 2015-08-20 NOTE — Telephone Encounter (Signed)
Rx already ready for pick up.  Pt informed again

## 2015-09-22 DIAGNOSIS — L2089 Other atopic dermatitis: Secondary | ICD-10-CM | POA: Diagnosis not present

## 2015-09-22 DIAGNOSIS — Z79899 Other long term (current) drug therapy: Secondary | ICD-10-CM | POA: Diagnosis not present

## 2015-09-23 DIAGNOSIS — Z961 Presence of intraocular lens: Secondary | ICD-10-CM | POA: Diagnosis not present

## 2015-09-23 DIAGNOSIS — L209 Atopic dermatitis, unspecified: Secondary | ICD-10-CM | POA: Diagnosis not present

## 2015-09-23 DIAGNOSIS — H10413 Chronic giant papillary conjunctivitis, bilateral: Secondary | ICD-10-CM | POA: Diagnosis not present

## 2015-09-23 DIAGNOSIS — H18601 Keratoconus, unspecified, right eye: Secondary | ICD-10-CM | POA: Diagnosis not present

## 2015-10-20 ENCOUNTER — Other Ambulatory Visit: Payer: Self-pay | Admitting: Physician Assistant

## 2015-10-20 NOTE — Telephone Encounter (Signed)
LRF 08/19/15 #30  LOV 04/16/15  OK refill?

## 2015-10-20 NOTE — Telephone Encounter (Signed)
Patient left vm over the weekend requesting  rx for his hydrocodone   [phone]

## 2015-10-21 ENCOUNTER — Ambulatory Visit (INDEPENDENT_AMBULATORY_CARE_PROVIDER_SITE_OTHER): Payer: Medicare Other | Admitting: Physician Assistant

## 2015-10-21 ENCOUNTER — Encounter: Payer: Self-pay | Admitting: Physician Assistant

## 2015-10-21 ENCOUNTER — Other Ambulatory Visit: Payer: Self-pay | Admitting: Physician Assistant

## 2015-10-21 VITALS — BP 136/82 | HR 80 | Temp 97.9°F | Resp 18 | Wt 208.0 lb

## 2015-10-21 DIAGNOSIS — Z23 Encounter for immunization: Secondary | ICD-10-CM

## 2015-10-21 DIAGNOSIS — R739 Hyperglycemia, unspecified: Secondary | ICD-10-CM | POA: Diagnosis not present

## 2015-10-21 DIAGNOSIS — E781 Pure hyperglyceridemia: Secondary | ICD-10-CM | POA: Diagnosis not present

## 2015-10-21 DIAGNOSIS — M10072 Idiopathic gout, left ankle and foot: Secondary | ICD-10-CM

## 2015-10-21 DIAGNOSIS — M109 Gout, unspecified: Secondary | ICD-10-CM | POA: Insufficient documentation

## 2015-10-21 DIAGNOSIS — G2581 Restless legs syndrome: Secondary | ICD-10-CM

## 2015-10-21 LAB — COMPLETE METABOLIC PANEL WITH GFR
ALT: 23 U/L (ref 9–46)
AST: 19 U/L (ref 10–40)
Albumin: 4.3 g/dL (ref 3.6–5.1)
Alkaline Phosphatase: 63 U/L (ref 40–115)
BUN: 9 mg/dL (ref 7–25)
CO2: 25 mmol/L (ref 20–31)
Calcium: 9.2 mg/dL (ref 8.6–10.3)
Chloride: 104 mmol/L (ref 98–110)
Creat: 0.68 mg/dL (ref 0.60–1.35)
GFR, Est African American: >89 mL/min (ref >=60)
GFR, Est Non African American: >89 mL/min (ref >=60)
Glucose, Bld: 80 mg/dL (ref 70–99)
Potassium: 4.5 mmol/L (ref 3.5–5.3)
Sodium: 142 mmol/L (ref 135–146)
Total Bilirubin: 0.3 mg/dL (ref 0.2–1.2)
Total Protein: 6.9 g/dL (ref 6.1–8.1)

## 2015-10-21 LAB — HEMOGLOBIN A1C
Hgb A1c MFr Bld: 5.9 % — ABNORMAL HIGH (ref <5.7)
Mean Plasma Glucose: 123 mg/dL — ABNORMAL HIGH (ref <117)

## 2015-10-21 LAB — LIPID PANEL
Cholesterol: 178 mg/dL (ref 125–200)
HDL: 48 mg/dL (ref >=40)
LDL Cholesterol: 86 mg/dL (ref <130)
Total CHOL/HDL Ratio: 3.7 Ratio (ref <=5.0)
Triglycerides: 219 mg/dL — ABNORMAL HIGH (ref <150)
VLDL: 44 mg/dL — ABNORMAL HIGH (ref <30)

## 2015-10-21 LAB — URIC ACID: Uric Acid, Serum: 5.9 mg/dL (ref 4.0–7.8)

## 2015-10-21 MED ORDER — HYDROCODONE-ACETAMINOPHEN 5-325 MG PO TABS: 1.0000 | ORAL_TABLET | Freq: Four times a day (QID) | ORAL | Status: DC | PRN

## 2015-10-21 NOTE — Progress Notes (Signed)
Patient ID: Adam Lozano MRN: [id number], DOB: 1971/09/15, 44 y.o. Date of Encounter: 10/21/2015, 8:44 AM    Chief Complaint:  Chief Complaint  Patient presents with  . 6 mth check up    not fasting     HPI: 44 y.o. year old white male for 6 month followup visit.   Here for a routine six-month followup regarding history of hyperglycemia. At office visit 03/2014 he reported that he had cut back on sodas and carb intake. At [place] 09/2014 he says that he has definitely decreased intake of fast food a lot. Says he does rarely drinks soda but says that he is drinking sweet tea. Says that he is trying to eat smaller portions and has decreased bread intake. Asked if he still has his handout regarding low carbohydrate diet and he says "you better give me another one"--so I did give him another handout again today 09/2014.  At [place] 03/2015--says he thought he lost a few pounds since the last visit. We checked and 09/2014 weight was 217. 2016 weight down to 210. Down 10 pounds. Says that since he has moved out towards [facility] he is not as close to fast food as he was. Says that he is also decreased portion sizes. Says he is doing no exercise but these diet changes him are what has gotten his weight loss. At [place] 09/2015--says that he has continued with his diet changes. Weight today 208.  At his OV 03/2014 he pulled up his shirt and showed me the significant dermatitis he currently had. As well, he showed me his notepad where he has documented notes which includes recent prednisone.  At [place] 09/2014 he has a Building services engineer document where he is keeping records of prednisone dose.  Since 06/2014 he has been on either prednisone 20 mg or 10 mg depending on the amount of dermatitis at different times.  He says that this is being prescribed by his dermatologist and managed by dermatology.  At [place] 03/2015 he states that dermatology recently increased his methotrexate from 4 pills to 6 pills and  increase his folic acid dose. Says dermatology gives him prednisone to take it his discretion. Had to take some low-dose 10 mg about 2 weeks ago.  At [place] 09/2015--he says that he has not had to use any prednisone in a couple of months.o/w, in general, dermatitis has been "about the same".  At [place] 09/2015 he says that recently woke up one morning with pain swelling and decreased range of motion in his right first toe. Says that he had had no known trauma or injury. As it has gradually improved but there is still a slight ache present there in the range of motion is still slightly decreased compared to the first toe on the left foot. Says he has no prior history of gout but a friend of his told him it may be gout either arthritis from injury when he was younger.  No other complaints or concerns today.  Home Meds:   Outpatient Prescriptions Prior to Visit  Medication Sig Dispense Refill  . Artificial Tear Ointment (ARTIFICIAL TEARS) ointment Place 1 drop into both eyes daily.     . carboxymethylcellulose (REFRESH PLUS) 0.5 % SOLN Place 1 drop into both eyes 2 (two) times daily.     . cetirizine (ZYRTEC) 10 MG tablet Take 10 mg by mouth daily.    . clobetasol cream (TEMOVATE) [id number] % Apply 1 application topically 2 (two) times daily.     Marland Kitchen  COMBIVENT RESPIMAT 20-100 MCG/ACT AERS respimat INHALE 1 PUFF 4 TIMES A DAY MAX 6 PUFFS A DAY 4 g 6  . fish oil-omega-3 fatty acids 1000 MG capsule Take 2 g by mouth daily.    . folic acid (FOLVITE) [id number] MCG tablet Take 800 mcg by mouth 2 (two) times daily.     . hydrocortisone valerate cream (WESTCORT) 0.2 % Apply 1 application topically daily.     . methotrexate (RHEUMATREX) 2.5 MG tablet Take 15 mg by mouth once a week. Caution:Chemotherapy. Protect from light. Take on sundays Pt takes 6 pills every Sunday to equal 15mg     . montelukast (SINGULAIR) 10 MG tablet TAKE 1 TABLET BY MOUTH EVERY DAY FOR ALLERGIES AND ASTHMA 30 tablet 6  . NONFORMULARY OR COMPOUNDED  ITEM Place 1 application into both eyes 2 (two) times daily as needed (Dexamethasone Phosphate 0.05%).    Marland Kitchen PATADAY 0.2 % SOLN Place 1 drop into both eyes daily.    . pimecrolimus (ELIDEL) 1 % cream Apply topically 2 (two) times daily.    . prednisoLONE acetate (PRED FORTE) 1 % ophthalmic suspension Place 1 drop into both eyes daily.  0  . predniSONE (DELTASONE) 10 MG tablet Take 1 tablet by mouth as directed. Per Dermatologist    . Dellis Anes 160-4.5 MCG/ACT inhaler INHALE 2 PUFFS BY MOUTH TWICE A DAY 10.2 Inhaler 5  . tiZANidine (ZANAFLEX) 4 MG tablet TAKE 1 TABLET (4 MG TOTAL) BY MOUTH EVERY 6 (SIX) HOURS AS NEEDED FOR MUSCLE SPASMS. 60 tablet 2  . HYDROcodone-acetaminophen (NORCO/VICODIN) 5-325 MG tablet Take 1 tablet by mouth every 6 (six) hours as needed for moderate pain. 30 tablet 0   No facility-administered medications prior to visit.    Allergies:  Allergies  Allergen Reactions  . Peanut-Containing Drug Products     hives      Review of Systems: See HPI for pertinent ROS. All other ROS negative.    Physical Exam: Blood pressure 136/82, pulse 80, temperature 97.9 F (36.6 C), temperature source Oral, resp. rate 18, weight 208 lb (94.348 kg)., Body mass index is 34.61 kg/(m^2). General: Overweight WM with moderate abdominal obesity.  Appears in no acute distress. Neck: Supple. No thyromegaly. No lymphadenopathy. No carotid bruit. Lungs: Clear bilaterally to auscultation without wheezes, rales, or rhonchi. Breathing is unlabored. Heart: Regular rhythm. No murmurs, rubs, or gallops. Extremities/Skin: Warm and dry. Signficant Dermatitis, managed by specialists.  Right 1st Toe: No erythema at present. No swelling at present. ROM slightly decreased compared to ROM in 1st toe on Left. Minimal tenderness with palpation of toe now. Neuro: Alert and oriented X 3. Moves all extremities spontaneously. Gait is normal. CNII-XII grossly in tact. Psych:  Responds to questions appropriately  with a normal affect.     ASSESSMENT AND PLAN:  44 y.o. year old male with  1. GLUCOSE INTOLERANCE --Frequent Prednisone use - Hemoglobin A1C, fingerstick  If A1c remains stable can wait 6 months for follow-up office visit. Follow up sooner if needed.  Given his frequent prednisone use would need to remember to do a bone density scan at a younger age than usual.  Can wait until age 69 to start prostate cancer screening and colon cancer screening.  Will need to check lipid panel at some point. At visit 09/2015 he states that he has had a glass of sweet tea this morning otherwise is fasting. We'll go ahead and check lipid panel.   Hyperglycemia - COMPLETE METABOLIC PANEL WITH GFR - Lipid  panel - Hemoglobin A1c   RESTLESS LEGS SYNDROME  Acute gout of left foot, unspecified cause Check uric acid level. Also told him to call me if he is having symptoms of possible gout flare recurrent in the future - Uric acid  Need for Tdap vaccination - Tdap vaccine greater than or equal to 7yo IM   Immunizations: Has been getting influenza vaccines routinely. Received 08/26/2015 He has had Pneumovax 23. Last tetanus vaccine was 03/2005. This is due today. He is agreeable to go ahead and get this today.--Given here 10/21/2015  Signed, Karis Juba, [place], [facility] 10/21/2015 8:44 AM

## 2015-10-21 NOTE — Telephone Encounter (Signed)
Medication refilled per protocol. 

## 2015-10-21 NOTE — Telephone Encounter (Signed)
Approved. # 30 + 0. 

## 2015-10-21 NOTE — Telephone Encounter (Signed)
rx printed and pt here for OV to pick up

## 2015-10-22 ENCOUNTER — Encounter: Payer: Self-pay | Admitting: Family Medicine

## 2015-12-02 DIAGNOSIS — H18601 Keratoconus, unspecified, right eye: Secondary | ICD-10-CM | POA: Diagnosis not present

## 2015-12-02 DIAGNOSIS — H01134 Eczematous dermatitis of left upper eyelid: Secondary | ICD-10-CM | POA: Diagnosis not present

## 2015-12-02 DIAGNOSIS — H01131 Eczematous dermatitis of right upper eyelid: Secondary | ICD-10-CM | POA: Diagnosis not present

## 2015-12-02 DIAGNOSIS — H01132 Eczematous dermatitis of right lower eyelid: Secondary | ICD-10-CM | POA: Diagnosis not present

## 2015-12-02 DIAGNOSIS — H01135 Eczematous dermatitis of left lower eyelid: Secondary | ICD-10-CM | POA: Diagnosis not present

## 2015-12-28 ENCOUNTER — Telehealth: Payer: Self-pay | Admitting: Physician Assistant

## 2015-12-28 MED ORDER — HYDROCODONE-ACETAMINOPHEN 5-325 MG PO TABS: 1.0000 | ORAL_TABLET | Freq: Four times a day (QID) | ORAL | Status: DC | PRN

## 2015-12-28 NOTE — Telephone Encounter (Signed)
RX ready pt aware

## 2015-12-28 NOTE — Telephone Encounter (Signed)
Patient requesting refill on his HYDROcodone-acetaminophen (NORCO/VICODIN) 5-325 MG tablet   CB#[phone]

## 2015-12-28 NOTE — Telephone Encounter (Signed)
LRF 10/18/15+ #30  LOV 10/21/15  OK refill?

## 2015-12-28 NOTE — Telephone Encounter (Signed)
Approved. # 30 + 0. 

## 2016-01-06 ENCOUNTER — Other Ambulatory Visit: Payer: Self-pay | Admitting: Physician Assistant

## 2016-01-07 NOTE — Telephone Encounter (Signed)
Medication refilled per protocol. 

## 2016-01-26 ENCOUNTER — Other Ambulatory Visit: Payer: Self-pay | Admitting: Physician Assistant

## 2016-01-26 NOTE — Telephone Encounter (Signed)
Refill appropriate and filled per protocol. 

## 2016-02-24 ENCOUNTER — Other Ambulatory Visit: Payer: Self-pay | Admitting: Physician Assistant

## 2016-02-24 NOTE — Telephone Encounter (Signed)
Pt is calling for a refill of Hydrocodone 5-325 [phone]

## 2016-02-25 MED ORDER — HYDROCODONE-ACETAMINOPHEN 5-325 MG PO TABS: 1.0000 | ORAL_TABLET | Freq: Four times a day (QID) | ORAL | Status: DC | PRN

## 2016-02-25 NOTE — Telephone Encounter (Signed)
LRF 12/28/15 #30  LOV 10/21/15  OK refill?

## 2016-02-25 NOTE — Telephone Encounter (Signed)
Left pt message Rx ready after 2PM today

## 2016-02-25 NOTE — Telephone Encounter (Signed)
Approved. # 30 + 0. 

## 2016-04-01 ENCOUNTER — Encounter (HOSPITAL_COMMUNITY): Payer: Self-pay | Admitting: Emergency Medicine

## 2016-04-01 ENCOUNTER — Emergency Department (HOSPITAL_COMMUNITY): Payer: Medicare Other

## 2016-04-01 ENCOUNTER — Emergency Department (HOSPITAL_COMMUNITY)
Admission: EM | Admit: 2016-04-01 | Discharge: 2016-04-01 | Disposition: A | Discharge status: Home / Self Care | Payer: Medicare Other | Attending: Emergency Medicine | Admitting: Emergency Medicine

## 2016-04-01 DIAGNOSIS — R079 Chest pain, unspecified: Secondary | ICD-10-CM | POA: Diagnosis not present

## 2016-04-01 DIAGNOSIS — Z87891 Personal history of nicotine dependence: Secondary | ICD-10-CM | POA: Insufficient documentation

## 2016-04-01 DIAGNOSIS — J45909 Unspecified asthma, uncomplicated: Secondary | ICD-10-CM | POA: Insufficient documentation

## 2016-04-01 DIAGNOSIS — Z91010 Allergy to peanuts: Secondary | ICD-10-CM | POA: Diagnosis not present

## 2016-04-01 DIAGNOSIS — R109 Unspecified abdominal pain: Secondary | ICD-10-CM

## 2016-04-01 DIAGNOSIS — Z79899 Other long term (current) drug therapy: Secondary | ICD-10-CM | POA: Insufficient documentation

## 2016-04-01 DIAGNOSIS — R1010 Upper abdominal pain, unspecified: Secondary | ICD-10-CM | POA: Diagnosis not present

## 2016-04-01 DIAGNOSIS — R112 Nausea with vomiting, unspecified: Secondary | ICD-10-CM | POA: Diagnosis present

## 2016-04-01 HISTORY — DX: Restless legs syndrome: G25.81

## 2016-04-01 HISTORY — DX: Gout, unspecified: M10.9

## 2016-04-01 LAB — URINALYSIS, ROUTINE W REFLEX MICROSCOPIC
Bilirubin Urine: NEGATIVE
Glucose, UA: NEGATIVE mg/dL
Hgb urine dipstick: NEGATIVE
Ketones, ur: 15 mg/dL — AB
Leukocytes, UA: NEGATIVE
Nitrite: NEGATIVE
Protein, ur: 100 mg/dL — AB
Specific Gravity, Urine: 1.023 (ref 1.005–1.030)
pH: 8.5 — ABNORMAL HIGH (ref 5.0–8.0)

## 2016-04-01 LAB — COMPREHENSIVE METABOLIC PANEL
ALT: 44 U/L (ref 17–63)
AST: 35 U/L (ref 15–41)
Albumin: 4.6 g/dL (ref 3.5–5.0)
Alkaline Phosphatase: 57 U/L (ref 38–126)
Anion gap: 10 (ref 5–15)
BUN: 12 mg/dL (ref 6–20)
CO2: 23 mmol/L (ref 22–32)
Calcium: 10 mg/dL (ref 8.9–10.3)
Chloride: 105 mmol/L (ref 101–111)
Creatinine, Ser: 0.97 mg/dL (ref 0.61–1.24)
GFR calc Af Amer: >60 mL/min (ref >60)
GFR calc non Af Amer: >60 mL/min (ref >60)
Glucose, Bld: 137 mg/dL — ABNORMAL HIGH (ref 65–99)
Potassium: 4.4 mmol/L (ref 3.5–5.1)
Sodium: 138 mmol/L (ref 135–145)
Total Bilirubin: 0.7 mg/dL (ref 0.3–1.2)
Total Protein: 7.5 g/dL (ref 6.5–8.1)

## 2016-04-01 LAB — CBC
HCT: 43 % (ref 39.0–52.0)
Hemoglobin: 14.4 g/dL (ref 13.0–17.0)
MCH: 29.2 pg (ref 26.0–34.0)
MCHC: 33.5 g/dL (ref 30.0–36.0)
MCV: 87.2 fL (ref 78.0–100.0)
Platelets: 224 10*3/uL (ref 150–400)
RBC: 4.93 MIL/uL (ref 4.22–5.81)
RDW: 13.9 % (ref 11.5–15.5)
WBC: 13.6 10*3/uL — ABNORMAL HIGH (ref 4.0–10.5)

## 2016-04-01 LAB — URINE MICROSCOPIC-ADD ON
Bacteria, UA: NONE SEEN
RBC / HPF: NONE SEEN RBC/hpf (ref 0–5)
Squamous Epithelial / LPF: NONE SEEN
WBC, UA: NONE SEEN WBC/hpf (ref 0–5)

## 2016-04-01 LAB — LIPASE, BLOOD: Lipase: 19 U/L (ref 11–51)

## 2016-04-01 MED ORDER — ONDANSETRON 4 MG PO TBDP: 4.0000 mg | ORAL_TABLET | Freq: Once | ORAL | Status: DC | PRN

## 2016-04-01 MED ORDER — ONDANSETRON 4 MG PO TBDP
ORAL_TABLET | ORAL | Status: AC
  Filled 2016-04-01: qty 1

## 2016-04-01 MED ORDER — ONDANSETRON HCL 4 MG/2ML IJ SOLN
4.0000 mg | INTRAMUSCULAR | Status: DC | PRN
  Administered 2016-04-01: 4 mg via INTRAVENOUS
  Filled 2016-04-01: qty 2

## 2016-04-01 MED ORDER — FAMOTIDINE IN NACL 20-0.9 MG/50ML-% IV SOLN
20.0000 mg | Freq: Once | INTRAVENOUS | Status: AC
  Administered 2016-04-01: 20 mg via INTRAVENOUS
  Filled 2016-04-01: qty 50

## 2016-04-01 MED ORDER — ONDANSETRON HCL 4 MG PO TABS: 4.0000 mg | ORAL_TABLET | Freq: Three times a day (TID) | ORAL | Status: DC | PRN

## 2016-04-01 MED ORDER — SODIUM CHLORIDE 0.9 % IV BOLUS (SEPSIS)
1000.0000 mL | Freq: Once | INTRAVENOUS | Status: AC
  Administered 2016-04-01: 1000 mL via INTRAVENOUS

## 2016-04-01 NOTE — ED Notes (Signed)
abd pain and vomiting since eating at [employer] last night

## 2016-04-01 NOTE — ED Provider Notes (Signed)
CSN: [id number]     Arrival date & time 04/01/16  1240 History   First MD Initiated Contact with Patient 04/01/16 1442     Chief Complaint  Patient presents with  . Emesis  . Abdominal Pain      HPI Pt was seen at 1445. Per pt, c/o gradual onset and persistence of multiple intermittent episodes of N/V that began overnight last night.  Has been associated with upper abd "pain."  Last BM overnight was normal for pt. Denies diarrhea, no CP/SOB, no back pain, no fevers, no black or blood in stools or emesis.    Past Medical History  Diagnosis Date  . Asthma   . GERD (gastroesophageal reflux disease)   . Low back pain     Chronic  . Atopic dermatitis    Past Surgical History  Procedure Laterality Date  . Eye surgery  02/24/14/15    cataracts rt eye    Social History  Substance Use Topics  . Smoking status: Former Smoker    Quit date: 11/25/2011  . Smokeless tobacco: Never Used  . Alcohol Use: No    Review of Systems ROS: Statement: All systems negative except as marked or noted in the HPI; Constitutional: Negative for fever and chills. ; ; Eyes: Negative for eye pain, redness and discharge. ; ; ENMT: Negative for ear pain, hoarseness, nasal congestion, sinus pressure and sore throat. ; ; Cardiovascular: Negative for chest pain, palpitations, diaphoresis, dyspnea and peripheral edema. ; ; Respiratory: Negative for cough, wheezing and stridor. ; ; Gastrointestinal: +N/V, abd pain. Negative for diarrhea, blood in stool, hematemesis, jaundice and rectal bleeding. . ; ; Genitourinary: Negative for dysuria, flank pain and hematuria. ; ; Musculoskeletal: Negative for back pain and neck pain. Negative for swelling and trauma.; ; Skin: Negative for pruritus, rash, abrasions, blisters, bruising and skin lesion.; ; Neuro: Negative for headache, lightheadedness and neck stiffness. Negative for weakness, altered level of consciousness, altered mental status, extremity weakness, paresthesias,  involuntary movement, seizure and syncope.      Allergies  Peanut-containing drug products  Home Medications   Prior to Admission medications   Medication Sig Start Date End Date Taking? Authorizing Provider  Artificial Tear Ointment (ARTIFICIAL TEARS) ointment Place 1 drop into both eyes daily.     Historical Provider, MD  carboxymethylcellulose (REFRESH PLUS) 0.5 % SOLN Place 1 drop into both eyes 2 (two) times daily.     Historical Provider, MD  cetirizine (ZYRTEC) 10 MG tablet Take 10 mg by mouth daily.    Historical Provider, MD  clobetasol cream (TEMOVATE) AB-123456789 % Apply 1 application topically 2 (two) times daily.     Historical Provider, MD  COMBIVENT RESPIMAT 20-100 MCG/ACT AERS respimat INHALE 1 PUFF INTO THE LUNGS 4 TIMES A DAY**MAX 6 PUFFS A DAY 01/26/16   Orlena Sheldon, PA-C  fish oil-omega-3 fatty acids 1000 MG capsule Take 2 g by mouth daily.    Historical Provider, MD  folic acid (FOLVITE) A999333 MCG tablet Take 800 mcg by mouth 2 (two) times daily.     Historical Provider, MD  HYDROcodone-acetaminophen (NORCO/VICODIN) 5-325 MG tablet Take 1 tablet by mouth every 6 (six) hours as needed for moderate pain. 02/25/16   Lonie Peak Dixon, PA-C  hydrocortisone valerate cream (WESTCORT) 0.2 % Apply 1 application topically daily.  01/22/13   Historical Provider, MD  methotrexate (RHEUMATREX) 2.5 MG tablet Take 15 mg by mouth once a week. Caution:Chemotherapy. Protect from light. Take on sundays Pt takes  6 pills every Sunday to equal 15mg     Historical Provider, MD  montelukast (SINGULAIR) 10 MG tablet TAKE 1 TABLET BY MOUTH EVERY DAY FOR ALLERGIES AND ASTHMA 01/07/16   Orlena Sheldon, PA-C  NONFORMULARY OR COMPOUNDED ITEM Place 1 application into both eyes 2 (two) times daily as needed (Dexamethasone Phosphate 0.05%).    Historical Provider, MD  PATADAY 0.2 % SOLN Place 1 drop into both eyes daily. 09/30/14   Historical Provider, MD  pimecrolimus (ELIDEL) 1 % cream Apply topically 2 (two) times daily.     Historical Provider, MD  prednisoLONE acetate (PRED FORTE) 1 % ophthalmic suspension Place 1 drop into both eyes daily. 09/24/14   Historical Provider, MD  predniSONE (DELTASONE) 10 MG tablet Take 1 tablet by mouth as directed. Per Dermatologist 09/11/14   Historical Provider, MD  SYMBICORT 160-4.5 MCG/ACT inhaler INHALE 2 PUFFS BY MOUTH TWICE A DAY 10/21/15   Lonie Peak Dixon, PA-C  tiZANidine (ZANAFLEX) 4 MG tablet TAKE 1 TABLET (4 MG TOTAL) BY MOUTH EVERY 6 (SIX) HOURS AS NEEDED FOR MUSCLE SPASMS. 05/12/15   Mary B Dixon, PA-C   BP 168/119 mmHg  Pulse 100  Temp(Src) 97.5 F (36.4 C) (Oral)  Resp 18  SpO2 99% Physical Exam  1450: Physical examination:  Nursing notes reviewed; Vital signs and O2 SAT reviewed;  Constitutional: Well developed, Well nourished, Well hydrated, In no acute distress; Head:  Normocephalic, atraumatic; Eyes: EOMI, PERRL, No scleral icterus; ENMT: Mouth and pharynx normal, Mucous membranes moist; Neck: Supple, Full range of motion, No lymphadenopathy; Cardiovascular: Regular rate and rhythm, No murmur, rub, or gallop; Respiratory: Breath sounds clear & equal bilaterally, No rales, rhonchi, wheezes.  Speaking full sentences with ease, Normal respiratory effort/excursion; Chest: Nontender, Movement normal; Abdomen: Soft, +mild mid-epigastric tenderness to palp. No rebound or guarding. Nondistended, Normal bowel sounds; Genitourinary: No CVA tenderness; Extremities: Pulses normal, No tenderness, No edema, No calf edema or asymmetry.; Neuro: AA&Ox3, Major CN grossly intact.  Speech clear. No gross focal motor or sensory deficits in extremities.; Skin: Color normal, Warm, Dry.   ED Course  Procedures (including critical care time) Labs Review  Imaging Review  I have personally reviewed and evaluated these images and lab results as part of my medical decision-making.   EKG Interpretation None      MDM  MDM Reviewed: previous chart, nursing note and vitals Reviewed  previous: labs Interpretation: labs and x-ray      Results for orders placed or performed during the hospital encounter of 04/01/16  Lipase, blood  Result Value Ref Range   Lipase 19 11 - 51 U/L  Comprehensive metabolic panel  Result Value Ref Range   Sodium 138 135 - 145 mmol/L   Potassium 4.4 3.5 - 5.1 mmol/L   Chloride 105 101 - 111 mmol/L   CO2 23 22 - 32 mmol/L   Glucose, Bld 137 (H) 65 - 99 mg/dL   BUN 12 6 - 20 mg/dL   Creatinine, Ser 0.97 0.61 - 1.24 mg/dL   Calcium 10.0 8.9 - 10.3 mg/dL   Total Protein 7.5 6.5 - 8.1 g/dL   Albumin 4.6 3.5 - 5.0 g/dL   AST 35 15 - 41 U/L   ALT 44 17 - 63 U/L   Alkaline Phosphatase 57 38 - 126 U/L   Total Bilirubin 0.7 0.3 - 1.2 mg/dL   GFR calc non Af Amer >60 >60 mL/min   GFR calc Af Amer >60 >60 mL/min   Anion gap  10 5 - 15  CBC  Result Value Ref Range   WBC 13.6 (H) 4.0 - 10.5 K/uL   RBC 4.93 4.22 - 5.81 MIL/uL   Hemoglobin 14.4 13.0 - 17.0 g/dL   HCT 43.0 39.0 - 52.0 %   MCV 87.2 78.0 - 100.0 fL   MCH 29.2 26.0 - 34.0 pg   MCHC 33.5 30.0 - 36.0 g/dL   RDW 13.9 11.5 - 15.5 %   Platelets 224 150 - 400 K/uL  Urinalysis, Routine w reflex microscopic  Result Value Ref Range   Color, Urine YELLOW YELLOW   APPearance CLEAR CLEAR   Specific Gravity, Urine 1.023 1.005 - 1.030   pH 8.5 (H) 5.0 - 8.0   Glucose, UA NEGATIVE NEGATIVE mg/dL   Hgb urine dipstick NEGATIVE NEGATIVE   Bilirubin Urine NEGATIVE NEGATIVE   Ketones, ur 15 (A) NEGATIVE mg/dL   Protein, ur 100 (A) NEGATIVE mg/dL   Nitrite NEGATIVE NEGATIVE   Leukocytes, UA NEGATIVE NEGATIVE  Urine microscopic-add on  Result Value Ref Range   Squamous Epithelial / LPF NONE SEEN NONE SEEN   WBC, UA NONE SEEN 0 - 5 WBC/hpf   RBC / HPF NONE SEEN 0 - 5 RBC/hpf   Bacteria, UA NONE SEEN NONE SEEN   Dg Abd Acute W/chest 04/01/2016  CLINICAL DATA:  Chest pain with nausea and vomiting EXAM: DG ABDOMEN ACUTE W/ 1V CHEST COMPARISON:  None. FINDINGS: Cardiac shadow is within normal  limits. The lungs are well aerated bilaterally. No acute bony abnormality is noted. Scattered large and small bowel gas is noted. No abnormal mass for calcifications are seen. Degenerative changes of lumbar spine are noted. IMPRESSION: No acute abnormality noted. Electronically Signed   By: Inez Catalina M.D.   On: 04/01/2016 15:26    1655: Pt has tol PO well while in the ED without N/V.  No stooling while in the ED.  Abd benign, VSS. Pt has ambulated with steady gait, easy resps, NAD. Feels better and wants to go home now. Workup reassuring; tx symptomatically at this time. Dx and testing d/w pt.  Questions answered.  Verb understanding, agreeable to d/c home with outpt f/u.    Francine Graven, DO 04/04/16 1555

## 2016-04-01 NOTE — Discharge Instructions (Signed)
Take the prescription as directed.  Increase your fluid intake (ie:  Gatoraide) for the next few days, as discussed.  Eat a bland diet and advance to your regular diet slowly as you can tolerate it. Call your regular medical doctor Monday to schedule a follow up appointment next week.  Return to the Emergency Department immediately if not improving (or even worsening) despite taking the medicines as prescribed, any black or bloody stool or vomit, if you develop a fever over "101," or for any other concerns.

## 2016-04-01 NOTE — ED Notes (Signed)
Pt ambulates independently and with steady gait at time of discharge. Discharge instructions and follow up information reviewed with patient. No other questions or concerns voiced at this time.  

## 2016-04-01 NOTE — ED Notes (Signed)
Pt tolerating PO fluids

## 2016-04-21 ENCOUNTER — Ambulatory Visit (INDEPENDENT_AMBULATORY_CARE_PROVIDER_SITE_OTHER): Payer: Medicare Other | Admitting: Physician Assistant

## 2016-04-21 ENCOUNTER — Encounter: Payer: Self-pay | Admitting: Physician Assistant

## 2016-04-21 VITALS — BP 124/70 | HR 68 | Temp 98.6°F | Resp 16 | Ht 65.0 in | Wt 218.0 lb

## 2016-04-21 DIAGNOSIS — G2581 Restless legs syndrome: Secondary | ICD-10-CM

## 2016-04-21 DIAGNOSIS — M10072 Idiopathic gout, left ankle and foot: Secondary | ICD-10-CM

## 2016-04-21 DIAGNOSIS — R739 Hyperglycemia, unspecified: Secondary | ICD-10-CM | POA: Diagnosis not present

## 2016-04-21 DIAGNOSIS — M109 Gout, unspecified: Secondary | ICD-10-CM

## 2016-04-21 LAB — HEMOGLOBIN A1C, FINGERSTICK: Hgb A1C (fingerstick): 6.3 % — ABNORMAL HIGH (ref <5.7)

## 2016-04-21 NOTE — Progress Notes (Signed)
Patient ID: Adam Lozano MRN: [id number], DOB: 01-Feb-1971, 45 y.o. Date of Encounter: 04/21/2016, 8:11 AM    Chief Complaint:  Chief Complaint  Patient presents with  . OTHER    6 month f/u      HPI: 45 y.o. year old white male for 6 month followup visit.   Here for a routine six-month followup regarding history of hyperglycemia. At office visit 03/2014 he reported that he had cut back on sodas and carb intake. At [place] 09/2014 he says that he has definitely decreased intake of fast food a lot. Says he does rarely drinks soda but says that he is drinking sweet tea. Says that he is trying to eat smaller portions and has decreased bread intake. Asked if he still has his handout regarding low carbohydrate diet and he says "you better give me another one"--so I did give him another handout again today 09/2014.  At [place] 03/2015--says he thought he lost a few pounds since the last visit. We checked and 09/2014 weight was 217. 2016 weight down to 210. Down 10 pounds. Says that since he has moved out towards [facility] he is not as close to fast food as he was. Says that he is also decreased portion sizes. Says he is doing no exercise but these diet changes him are what has gotten his weight loss. At [place] 09/2015--says that he has continued with his diet changes. Weight today 208.  At his OV 03/2014 he pulled up his shirt and showed me the significant dermatitis he currently had. As well, he showed me his notepad where he has documented notes which includes recent prednisone.  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  Since 06/2014 he has been on either prednisone 20 mg or 10 mg depending on the amount of dermatitis at different times.  He says that this is being prescribed by his dermatologist and managed by dermatology.  At [place] 03/2015 he states that dermatology recently increased his methotrexate from 4 pills to 6 pills and increase  his folic acid dose. Says dermatology gives him prednisone to take it his discretion. Had to take some low-dose 10 mg about 2 weeks ago.  At [place] 09/2015--he says that he has not had to use any prednisone in a couple of months.o/w, in general, dermatitis has been "about the same".  At [place] 09/2015 he says that recently woke up one morning with pain swelling and decreased range of motion in his right first toe. Says that he had had no known trauma or injury. As it has gradually improved but there is still a slight ache present there in the range of motion is still slightly decreased compared to the first toe on the left foot. Says he has no prior history of gout but a friend of his told him it may be gout either arthritis from injury when he was younger.  04/21/2016: Is that his skin has been fairly stable recently. At visit 09/2015 he reported an episode of what we thought might be gout. Uric acid level at that visit and this came back normal at 5.9. Reviewed this with him today. Today he says that he has had no recurrent episodes similar to what he had had at last visit and no episodes consistent with gout flare. No other complaints or concerns today.  Home Meds:   Outpatient Prescriptions Prior to Visit  Medication Sig Dispense Refill  . Artificial  Tear Ointment (ARTIFICIAL TEARS) ointment Place 1 application into both eyes daily.     . carboxymethylcellulose (REFRESH PLUS) 0.5 % SOLN Place 1 drop into both eyes 2 (two) times daily.     . cetirizine (ZYRTEC) 10 MG tablet Take 10 mg by mouth daily.    . clobetasol cream (TEMOVATE) [id number] % Apply 1 application topically 2 (two) times daily.     . COMBIVENT RESPIMAT 20-100 MCG/ACT AERS respimat INHALE 1 PUFF INTO THE LUNGS 4 TIMES A DAY**MAX 6 PUFFS A DAY (Patient taking differently: INHALE 1 PUFF INTO THE LUNGS 4 TIMES A DAY AS NEEDED FOR SHORTNESS OF BREATH OR WHEEZING **MAX 6 PUFFS A DAY**) 4 Inhaler 6  . fish oil-omega-3 fatty acids 1000 MG capsule  Take 2 g by mouth daily.    . folic acid (FOLVITE) A999333 MCG tablet Take 1,600 mcg by mouth daily.     Marland Kitchen HYDROcodone-acetaminophen (NORCO/VICODIN) 5-325 MG tablet Take 1 tablet by mouth every 6 (six) hours as needed for moderate pain. 30 tablet 0  . hydrocortisone valerate cream (WESTCORT) 0.2 % Apply 1 application topically daily.     Marland Kitchen ibuprofen (ADVIL,MOTRIN) 200 MG tablet Take 200-400 mg by mouth every 6 (six) hours as needed for mild pain or moderate pain.    . methotrexate (RHEUMATREX) 2.5 MG tablet Take 15 mg by mouth once a week. Caution:Chemotherapy. Protect from light. Take on sundays Pt takes 6 pills every Sunday to equal 15mg     . montelukast (SINGULAIR) 10 MG tablet TAKE 1 TABLET BY MOUTH EVERY DAY FOR ALLERGIES AND ASTHMA 90 tablet 3  . Multiple Vitamins-Minerals (PRESERVISION AREDS) CAPS Take 1 capsule by mouth daily.    . ondansetron (ZOFRAN) 4 MG tablet Take 1 tablet (4 mg total) by mouth every 8 (eight) hours as needed for nausea or vomiting. 6 tablet 0  . PATADAY 0.2 % SOLN Place 1 drop into both eyes daily.    . pimecrolimus (ELIDEL) 1 % cream Apply topically 2 (two) times daily.    . prednisoLONE acetate (PRED FORTE) 1 % ophthalmic suspension Place 1 drop into both eyes daily.  0  . predniSONE (DELTASONE) 10 MG tablet Take 1 tablet by mouth daily as needed (atopic dermatitis). Per Dermatologist    . Dellis Anes 160-4.5 MCG/ACT inhaler INHALE 2 PUFFS BY MOUTH TWICE A DAY 10.2 Inhaler 5  . tiZANidine (ZANAFLEX) 4 MG tablet TAKE 1 TABLET (4 MG TOTAL) BY MOUTH EVERY 6 (SIX) HOURS AS NEEDED FOR MUSCLE SPASMS. 60 tablet 2   No facility-administered medications prior to visit.    Allergies:  Allergies  Allergen Reactions  . Coconut Oil Hives and Itching    MAKES THROAT ITCH, ALSO  . Other Hives and Itching    NO TREE NUTS  . Peanut-Containing Drug Products Hives and Itching    MAKES THROAT ITCH, ALSO  . Latex Rash      Review of Systems: See HPI for pertinent ROS. All other  ROS negative.    Physical Exam: Blood pressure 124/70, pulse 68, temperature 98.6 F (37 C), temperature source Oral, resp. rate 16, height 5\' 5"  (1.651 m), weight 218 lb (98.884 kg)., Body mass index is 36.28 kg/(m^2). General: Overweight WM with moderate abdominal obesity.  Appears in no acute distress. Neck: Supple. No thyromegaly. No lymphadenopathy. No carotid bruit. Lungs: Clear bilaterally to auscultation without wheezes, rales, or rhonchi. Breathing is unlabored. Heart: Regular rhythm. No murmurs, rubs, or gallops. Extremities/Skin: Warm and dry. Signficant Dermatitis, managed by  specialists.  Neuro: Alert and oriented X 3. Moves all extremities spontaneously. Gait is normal. CNII-XII grossly in tact. Psych:  Responds to questions appropriately with a normal affect.     ASSESSMENT AND PLAN:  45 y.o. year old male with   GLUCOSE INTOLERANCE  Hyperglycemia --Frequent Prednisone use - Hemoglobin A1C, fingerstick  If A1c remains stable can wait 6 months for follow-up office visit. Follow up sooner if needed.  Given his frequent prednisone use would need to remember to do a bone density scan at a younger age than usual.  Can wait until age 56 to start prostate cancer screening and colon cancer screening.  Lipid Panel was checked 10/21/15. He actually had had some sweet tea that morning but otherwise was fasting. Triglycerides 219. HDL 48. LDL 86.     Acute gout of left foot, unspecified cause-----at OV 09/2015 he reported one episode of symptoms that sounded like possible gout. That visit uric acid level was checked and was normal at 5.9. He has had no recurrence of those type symptoms.   Immunizations: Has been getting influenza vaccines routinely. Received 08/26/2015 He has had Pneumovax 23. Tdap----Given here 10/21/2015  Signed, Karis Juba, PA, BSFM 04/21/2016 8:11 AM

## 2016-04-25 ENCOUNTER — Other Ambulatory Visit: Payer: Self-pay | Admitting: Physician Assistant

## 2016-04-25 MED ORDER — HYDROCODONE-ACETAMINOPHEN 5-325 MG PO TABS
1.0000 | ORAL_TABLET | Freq: Four times a day (QID) | ORAL | Status: DC | PRN
Start: 2016-04-25 — End: 2016-06-29

## 2016-04-25 NOTE — Telephone Encounter (Signed)
rx ready and pt aware 

## 2016-04-25 NOTE — Telephone Encounter (Signed)
Approved. # 30 + 0. 

## 2016-04-25 NOTE — Telephone Encounter (Signed)
LRF 02/25/16 #30   LOV 04/21/16  OK refill?

## 2016-04-25 NOTE — Telephone Encounter (Signed)
Pt needs a refill of Hydrocodone 5-325 mg [phone]

## 2016-05-02 DIAGNOSIS — H52211 Irregular astigmatism, right eye: Secondary | ICD-10-CM | POA: Diagnosis not present

## 2016-05-02 DIAGNOSIS — H2512 Age-related nuclear cataract, left eye: Secondary | ICD-10-CM | POA: Diagnosis not present

## 2016-05-02 DIAGNOSIS — Z961 Presence of intraocular lens: Secondary | ICD-10-CM | POA: Diagnosis not present

## 2016-05-02 DIAGNOSIS — H10413 Chronic giant papillary conjunctivitis, bilateral: Secondary | ICD-10-CM | POA: Diagnosis not present

## 2016-05-02 DIAGNOSIS — H1850 Unspecified hereditary corneal dystrophies: Secondary | ICD-10-CM | POA: Diagnosis not present

## 2016-06-29 ENCOUNTER — Other Ambulatory Visit: Payer: Self-pay

## 2016-06-29 NOTE — Telephone Encounter (Signed)
Last OV 04-21-16 Last refill 04-25-16 Ok to refill?

## 2016-06-29 NOTE — Telephone Encounter (Signed)
Approved. # 30 + 0. 

## 2016-07-01 ENCOUNTER — Other Ambulatory Visit: Payer: Self-pay

## 2016-07-01 MED ORDER — HYDROCODONE-ACETAMINOPHEN 5-325 MG PO TABS
1.0000 | ORAL_TABLET | Freq: Four times a day (QID) | ORAL | 0 refills | Status: DC | PRN
Start: 2016-07-01 — End: 2016-09-05

## 2016-07-01 NOTE — Telephone Encounter (Signed)
Rx can be picked up after 2pm on 07-04-16

## 2016-07-03 ENCOUNTER — Other Ambulatory Visit: Payer: Self-pay | Admitting: Physician Assistant

## 2016-07-04 ENCOUNTER — Telehealth: Payer: Self-pay

## 2016-07-04 NOTE — Telephone Encounter (Signed)
Rx can be picked up after 2pm 07-04-16

## 2016-07-05 DIAGNOSIS — Z79899 Other long term (current) drug therapy: Secondary | ICD-10-CM | POA: Diagnosis not present

## 2016-07-05 DIAGNOSIS — L2089 Other atopic dermatitis: Secondary | ICD-10-CM | POA: Diagnosis not present

## 2016-08-16 ENCOUNTER — Other Ambulatory Visit: Payer: Self-pay | Admitting: Physician Assistant

## 2016-09-02 ENCOUNTER — Other Ambulatory Visit: Payer: Self-pay | Admitting: Family Medicine

## 2016-09-05 ENCOUNTER — Other Ambulatory Visit: Payer: Self-pay

## 2016-09-05 MED ORDER — HYDROCODONE-ACETAMINOPHEN 5-325 MG PO TABS: 1.0000 | ORAL_TABLET | Freq: Four times a day (QID) | ORAL | 0 refills | Status: DC | PRN

## 2016-09-05 NOTE — Telephone Encounter (Signed)
Drug registry printed/reviewed. Is received no other controller medications at all. Last Rx for hydrocodone was 07/04/16 for #30.  Approved. #[id number].

## 2016-09-05 NOTE — Telephone Encounter (Signed)
Rx can be picked up after 2pm on 11-14

## 2016-09-05 NOTE — Telephone Encounter (Signed)
Last ov 6-29 Last refill 9-8 Okay to refill?

## 2016-10-27 ENCOUNTER — Ambulatory Visit: Payer: Medicare Other | Admitting: Physician Assistant

## 2016-11-02 ENCOUNTER — Ambulatory Visit (INDEPENDENT_AMBULATORY_CARE_PROVIDER_SITE_OTHER): Payer: Medicare HMO | Admitting: Physician Assistant

## 2016-11-02 VITALS — BP 140/86 | HR 110 | Temp 98.0°F | Resp 18 | Wt 209.8 lb

## 2016-11-02 DIAGNOSIS — R739 Hyperglycemia, unspecified: Secondary | ICD-10-CM | POA: Diagnosis not present

## 2016-11-02 DIAGNOSIS — E739 Lactose intolerance, unspecified: Secondary | ICD-10-CM

## 2016-11-02 LAB — HEMOGLOBIN A1C, FINGERSTICK: Hgb A1C (fingerstick): 6 % — ABNORMAL HIGH (ref <5.7)

## 2016-11-02 NOTE — Progress Notes (Signed)
Patient ID: Adam Lozano MRN: [id number], DOB: January 22, 1971, 46 y.o. Date of Encounter: 11/02/2016, 2:18 PM    Chief Complaint:  Chief Complaint  Patient presents with  . Hyperglycemia    6 month f/u     HPI: 46 y.o. year old white male for 6 month followup visit.   Here for a routine six-month followup regarding history of hyperglycemia. At office visit 03/2014 he reported that he had cut back on sodas and carb intake. At [place] 09/2014 he says that he has definitely decreased intake of fast food a lot. Says he does rarely drinks soda but says that he is drinking sweet tea. Says that he is trying to eat smaller portions and has decreased bread intake. Asked if he still has his handout regarding low carbohydrate diet and he says "you better give me another one"--so I did give him another handout again today 09/2014.  At [place] 03/2015--says he thought he lost a few pounds since the last visit. We checked and 09/2014 weight was 217. 2016 weight down to 210. Down 10 pounds. Says that since he has moved out towards [facility] he is not as close to fast food as he was. Says that he is also decreased portion sizes. Says he is doing no exercise but these diet changes him are what has gotten his weight loss. At [place] 09/2015--says that he has continued with his diet changes. Weight today 208.  At his OV 03/2014 he pulled up his shirt and showed me the significant dermatitis he currently had. As well, he showed me his notepad where he has documented notes which includes recent prednisone.  At [place] 09/2014 he has a Building services engineer document where he is keeping records of prednisone dose.  Since 06/2014 he has been on either prednisone 20 mg or 10 mg depending on the amount of dermatitis at different times.  He says that this is being prescribed by his dermatologist and managed by dermatology.  At [place] 03/2015 he states that dermatology recently increased his methotrexate from 4 pills to 6 pills and  increase his folic acid dose. Says dermatology gives him prednisone to take it his discretion. Had to take some low-dose 10 mg about 2 weeks ago.  At [place] 09/2015--he says that he has not had to use any prednisone in a couple of months.o/w, in general, dermatitis has been "about the same".  At [place] 09/2015 he says that recently woke up one morning with pain swelling and decreased range of motion in his right first toe. Says that he had had no known trauma or injury. As it has gradually improved but there is still a slight ache present there in the range of motion is still slightly decreased compared to the first toe on the left foot. Says he has no prior history of gout but a friend of his told him it may be gout either arthritis from injury when he was younger.  04/21/2016: Is that his skin has been fairly stable recently. At visit 09/2015 he reported an episode of what we thought might be gout. Uric acid level at that visit and this came back normal at 5.9. Reviewed this with him today. Today he says that he has had no recurrent episodes similar to what he had had at last visit and no episodes consistent with gout flare. No other complaints or concerns today.   11/02/2016: Today he says that his skin has been flaring recently. Pulls up his pant leg and  shows me really bad rash on the backs of his legs. Says that his methotrexate was increased a couple of years ago and it had been helping but now is having bad flares again. Says that he should've brought in his spreadsheet with his prednisone doses documented but forgot it. Says that just recently the Combivent is costing him $100. Says it was just costing him $40 and says that the Symbicort is just $40. Told him to see if his pharmacy can tell him what would be cheaper options or either contact his insurance company to find out. I will be happy to change medicine to a cheaper option once he finds out information. No other concerns today. Here to  recheck A1c.  Home Meds:   Outpatient Medications Prior to Visit  Medication Sig Dispense Refill  . Artificial Tear Ointment (ARTIFICIAL TEARS) ointment Place 1 application into both eyes daily.     . carboxymethylcellulose (REFRESH PLUS) 0.5 % SOLN Place 1 drop into both eyes 2 (two) times daily.     . cetirizine (ZYRTEC) 10 MG tablet Take 10 mg by mouth daily.    . clobetasol cream (TEMOVATE) [id number] % Apply 1 application topically 2 (two) times daily.     . COMBIVENT RESPIMAT 20-100 MCG/ACT AERS respimat INHALE 1 PUFF 4 TIMES A DAY (NO MORE THAN 6 PUFFS PER DAY) 4 g 5  . fish oil-omega-3 fatty acids 1000 MG capsule Take 2 g by mouth daily.    . folic acid (FOLVITE) [id number] MCG tablet Take 1,600 mcg by mouth daily.     Marland Kitchen HYDROcodone-acetaminophen (NORCO/VICODIN) 5-325 MG tablet Take 1 tablet by mouth every 6 (six) hours as needed for moderate pain. 30 tablet 0  . hydrocortisone valerate cream (WESTCORT) 0.2 % Apply 1 application topically daily.     Marland Kitchen ibuprofen (ADVIL,MOTRIN) 200 MG tablet Take 200-400 mg by mouth every 6 (six) hours as needed for mild pain or moderate pain.    . methotrexate (RHEUMATREX) 2.5 MG tablet Take 15 mg by mouth once a week. Caution:Chemotherapy. Protect from light. Take on sundays Pt takes 6 pills every Sunday to equal 15mg     . montelukast (SINGULAIR) 10 MG tablet TAKE 1 TABLET BY MOUTH EVERY DAY FOR ALLERGIES AND ASTHMA 90 tablet 3  . Multiple Vitamins-Minerals (PRESERVISION AREDS) CAPS Take 1 capsule by mouth daily.    . ondansetron (ZOFRAN) 4 MG tablet Take 1 tablet (4 mg total) by mouth every 8 (eight) hours as needed for nausea or vomiting. 6 tablet 0  . PATADAY 0.2 % SOLN Place 1 drop into both eyes daily.    . pimecrolimus (ELIDEL) 1 % cream Apply topically 2 (two) times daily.    . prednisoLONE acetate (PRED FORTE) 1 % ophthalmic suspension Place 1 drop into both eyes daily.  0  . predniSONE (DELTASONE) 10 MG tablet Take 1 tablet by mouth daily as needed  (atopic dermatitis). Per Dermatologist    . Dellis Anes 160-4.5 MCG/ACT inhaler INHALE 2 PUFFS TWICE A DAY 10.2 Inhaler 3  . tiZANidine (ZANAFLEX) 4 MG tablet TAKE 1 TABLET (4 MG TOTAL) BY MOUTH EVERY 6 (SIX) HOURS AS NEEDED FOR MUSCLE SPASMS. 60 tablet 2   No facility-administered medications prior to visit.     Allergies:  Allergies  Allergen Reactions  . Coconut Oil Hives and Itching    MAKES THROAT ITCH, ALSO  . Other Hives and Itching    NO TREE NUTS  . Peanut-Containing Drug Products Hives and Itching  MAKES THROAT ITCH, ALSO  . Latex Rash      Review of Systems: See HPI for pertinent ROS. All other ROS negative.    Physical Exam: Blood pressure 140/86, pulse (!) 110, temperature 98 F (36.7 C), temperature source Oral, resp. rate 18, weight 209 lb 12.8 oz (95.2 kg), SpO2 98 %., Body mass index is 34.91 kg/m. General: Overweight WM with moderate abdominal obesity.  Appears in no acute distress. Neck: Supple. No thyromegaly. No lymphadenopathy. No carotid bruit. Lungs: Clear bilaterally to auscultation without wheezes, rales, or rhonchi. Breathing is unlabored. Heart: Regular rhythm. No murmurs, rubs, or gallops. Extremities/Skin: Warm and dry. Signficant Dermatitis, managed by specialists.  Neuro: Alert and oriented X 3. Moves all extremities spontaneously. Gait is normal. CNII-XII grossly in tact. Psych:  Responds to questions appropriately with a normal affect.     ASSESSMENT AND PLAN:  47 y.o. year old male with   GLUCOSE INTOLERANCE  Hyperglycemia --Frequent Prednisone use - Hemoglobin A1C, fingerstick  If A1c remains stable can wait 6 months for follow-up office visit. Follow up sooner if needed.  Given his frequent prednisone use would need to remember to do a bone density scan at a younger age than usual.  Can wait until age 9 to start prostate cancer screening and colon cancer screening.  Lipid Panel was checked 10/21/15. He actually had had some  sweet tea that morning but otherwise was fasting. Triglycerides 219. HDL 48. LDL 86.     Acute gout of left foot, unspecified cause-----at OV 09/2015 he reported one episode of symptoms that sounded like possible gout. That visit uric acid level was checked and was normal at 5.9. He has had no recurrence of those type symptoms.   Immunizations: Has been getting influenza vaccines routinely. Received 08/26/2015 He has had Pneumovax 23. Tdap----Given here 10/21/2015  Signed, Karis Juba, PA, BSFM 11/02/2016 2:18 PM

## 2016-11-03 DIAGNOSIS — R739 Hyperglycemia, unspecified: Secondary | ICD-10-CM | POA: Diagnosis not present

## 2016-11-03 DIAGNOSIS — E739 Lactose intolerance, unspecified: Secondary | ICD-10-CM | POA: Diagnosis not present

## 2016-11-04 DIAGNOSIS — L2089 Other atopic dermatitis: Secondary | ICD-10-CM | POA: Diagnosis not present

## 2016-11-04 DIAGNOSIS — Z79899 Other long term (current) drug therapy: Secondary | ICD-10-CM | POA: Diagnosis not present

## 2016-11-08 ENCOUNTER — Other Ambulatory Visit: Payer: Self-pay

## 2016-11-08 MED ORDER — HYDROCODONE-ACETAMINOPHEN 5-325 MG PO TABS: 1.0000 | ORAL_TABLET | Freq: Four times a day (QID) | ORAL | 0 refills | Status: DC | PRN

## 2016-11-08 NOTE — Telephone Encounter (Signed)
Last OV 11-02-2016 Last refill 11-13 Ok to refill?

## 2016-11-08 NOTE — Telephone Encounter (Signed)
Approved. # 30 + 0. 

## 2016-11-08 NOTE — Telephone Encounter (Signed)
Rx can be picked up after 2pm 11-09-2016. Lvm for pt

## 2016-11-18 DIAGNOSIS — R69 Illness, unspecified: Secondary | ICD-10-CM | POA: Diagnosis not present

## 2016-12-09 ENCOUNTER — Other Ambulatory Visit: Payer: Self-pay | Admitting: Physician Assistant

## 2016-12-12 NOTE — Telephone Encounter (Signed)
Rx filled per protocol  

## 2016-12-18 ENCOUNTER — Other Ambulatory Visit: Payer: Self-pay | Admitting: Physician Assistant

## 2016-12-19 NOTE — Telephone Encounter (Signed)
Rx filled per protocol  

## 2017-01-10 ENCOUNTER — Other Ambulatory Visit: Payer: Self-pay

## 2017-01-10 MED ORDER — HYDROCODONE-ACETAMINOPHEN 5-325 MG PO TABS
1.0000 | ORAL_TABLET | Freq: Four times a day (QID) | ORAL | 0 refills | Status: DC | PRN
Start: 2017-01-10 — End: 2017-03-13

## 2017-01-10 NOTE — Telephone Encounter (Signed)
Rx printed patient can pick up after 2 pm on 3-21 depending on weather.LVM for patient

## 2017-01-10 NOTE — Telephone Encounter (Signed)
Approved. # 30 + 0. 

## 2017-01-10 NOTE — Telephone Encounter (Signed)
Last Ov 11-02-2016 Last refill 11-08-2016 Ok to refill?

## 2017-03-02 DIAGNOSIS — Z79899 Other long term (current) drug therapy: Secondary | ICD-10-CM | POA: Diagnosis not present

## 2017-03-02 DIAGNOSIS — L2089 Other atopic dermatitis: Secondary | ICD-10-CM | POA: Diagnosis not present

## 2017-03-13 ENCOUNTER — Other Ambulatory Visit: Payer: Self-pay

## 2017-03-13 MED ORDER — HYDROCODONE-ACETAMINOPHEN 5-325 MG PO TABS: 1.0000 | ORAL_TABLET | Freq: Four times a day (QID) | ORAL | 0 refills | Status: DC | PRN

## 2017-03-13 NOTE — Telephone Encounter (Signed)
Drug registry reviewed. I am the only prescriber on the drug registry. Hydrocodone 5/325 is the only medication on the drug registry. Most recent fill dates were 11/14/2016 for #30 and 01/11/2017 for #30. Refill approved now for #30+0.

## 2017-03-13 NOTE — Telephone Encounter (Signed)
Last OV 1/10 Last refill 3/20 Okay to refill?  Drug registry printed

## 2017-03-13 NOTE — Telephone Encounter (Signed)
Pt aware Rx can be picked up 5/22

## 2017-04-12 DIAGNOSIS — Z961 Presence of intraocular lens: Secondary | ICD-10-CM | POA: Diagnosis not present

## 2017-04-12 DIAGNOSIS — H01022 Squamous blepharitis right lower eyelid: Secondary | ICD-10-CM | POA: Diagnosis not present

## 2017-04-12 DIAGNOSIS — H1859 Other hereditary corneal dystrophies: Secondary | ICD-10-CM | POA: Diagnosis not present

## 2017-04-12 DIAGNOSIS — H01024 Squamous blepharitis left upper eyelid: Secondary | ICD-10-CM | POA: Diagnosis not present

## 2017-04-12 DIAGNOSIS — H01021 Squamous blepharitis right upper eyelid: Secondary | ICD-10-CM | POA: Diagnosis not present

## 2017-04-12 DIAGNOSIS — H16253 Phlyctenular keratoconjunctivitis, bilateral: Secondary | ICD-10-CM | POA: Diagnosis not present

## 2017-04-12 DIAGNOSIS — H01025 Squamous blepharitis left lower eyelid: Secondary | ICD-10-CM | POA: Diagnosis not present

## 2017-05-03 ENCOUNTER — Encounter: Payer: Self-pay | Admitting: Physician Assistant

## 2017-05-03 ENCOUNTER — Ambulatory Visit (INDEPENDENT_AMBULATORY_CARE_PROVIDER_SITE_OTHER): Payer: Medicare HMO | Admitting: Physician Assistant

## 2017-05-03 VITALS — BP 140/88 | HR 87 | Temp 97.6°F | Resp 16 | Ht 65.0 in | Wt 210.2 lb

## 2017-05-03 DIAGNOSIS — E739 Lactose intolerance, unspecified: Secondary | ICD-10-CM | POA: Diagnosis not present

## 2017-05-03 DIAGNOSIS — M545 Low back pain, unspecified: Secondary | ICD-10-CM

## 2017-05-03 DIAGNOSIS — R739 Hyperglycemia, unspecified: Secondary | ICD-10-CM

## 2017-05-03 DIAGNOSIS — J4520 Mild intermittent asthma, uncomplicated: Secondary | ICD-10-CM | POA: Diagnosis not present

## 2017-05-03 LAB — HEMOGLOBIN A1C, FINGERSTICK: Hgb A1C (fingerstick): 5.9 % — ABNORMAL HIGH (ref <5.7)

## 2017-05-03 MED ORDER — TIZANIDINE HCL 4 MG PO TABS: ORAL_TABLET | ORAL | 2 refills | Status: DC

## 2017-05-03 MED ORDER — HYDROCODONE-ACETAMINOPHEN 5-325 MG PO TABS: 1.0000 | ORAL_TABLET | Freq: Four times a day (QID) | ORAL | 0 refills | Status: DC | PRN

## 2017-05-03 NOTE — Progress Notes (Signed)
Patient ID: Adam Lozano MRN: [id number], DOB: May 20, 1971, 46 y.o. Date of Encounter: 05/03/2017, 2:30 PM    Chief Complaint:  Chief Complaint  Patient presents with  . 6 month routine follow up     HPI: 46 y.o. year old white male for 6 month followup visit.   Here for a routine six-month followup regarding history of hyperglycemia. At office visit 03/2014 he reported that he had cut back on sodas and carb intake. At [place] 09/2014 he says that he has definitely decreased intake of fast food a lot. Says he does rarely drinks soda but says that he is drinking sweet tea. Says that he is trying to eat smaller portions and has decreased bread intake. Asked if he still has his handout regarding low carbohydrate diet and he says "you better give me another one"--so I did give him another handout again today 09/2014.  At [place] 03/2015--says he thought he lost a few pounds since the last visit. We checked and 09/2014 weight was 217. 2016 weight down to 210. Down 10 pounds. Says that since he has moved out towards [facility] he is not as close to fast food as he was. Says that he is also decreased portion sizes. Says he is doing no exercise but these diet changes him are what has gotten his weight loss. At [place] 09/2015--says that he has continued with his diet changes. Weight today 208.  At his OV 03/2014 he pulled up his shirt and showed me the significant dermatitis he currently had. As well, he showed me his notepad where he has documented notes which includes recent prednisone.  At [place] 09/2014 he has a Building services engineer document where he is keeping records of prednisone dose.  Since 06/2014 he has been on either prednisone 20 mg or 10 mg depending on the amount of dermatitis at different times.  He says that this is being prescribed by his dermatologist and managed by dermatology.  At [place] 03/2015 he states that dermatology recently increased his methotrexate from 4 pills to 6 pills and  increase his folic acid dose. Says dermatology gives him prednisone to take it his discretion. Had to take some low-dose 10 mg about 2 weeks ago.  At [place] 09/2015--he says that he has not had to use any prednisone in a couple of months.o/w, in general, dermatitis has been "about the same".  At [place] 09/2015 he says that recently woke up one morning with pain swelling and decreased range of motion in his right first toe. Says that he had had no known trauma or injury. As it has gradually improved but there is still a slight ache present there in the range of motion is still slightly decreased compared to the first toe on the left foot. Says he has no prior history of gout but a friend of his told him it may be gout either arthritis from injury when he was younger.  04/21/2016: Is that his skin has been fairly stable recently. At visit 09/2015 he reported an episode of what we thought might be gout. Uric acid level at that visit and this came back normal at 5.9. Reviewed this with him today. Today he says that he has had no recurrent episodes similar to what he had had at last visit and no episodes consistent with gout flare. No other complaints or concerns today.   11/02/2016: Today he says that his skin has been flaring recently. Pulls up his pant leg and shows me  really bad rash on the backs of his legs. Says that his methotrexate was increased a couple of years ago and it had been helping but now is having bad flares again. Says that he should've brought in his spreadsheet with his prednisone doses documented but forgot it. Says that just recently the Combivent is costing him $100. Says it was just costing him $40 and says that the Symbicort is just $40. Told him to see if his pharmacy can tell him what would be cheaper options or either contact his insurance company to find out. I will be happy to change medicine to a cheaper option once he finds out information. No other concerns today. Here to  recheck A1c.  05/03/2017: States things have been pretty stable since LOV.  Regarding Prednisone use--has to take 5mg  - 10mg  occasionally. Says it gets worse in winter.  Is needing refill on pain med and muscle relaxer--tizanidine.  Has occasional low back pain. No pain, numbness, tingling down either leg. Says 90 - 95% of the time no back pain. But occasional spasm that causes him to feel "jerk" ing sensation. Then later after that will feel pain. No other concerns to address today.  Home Meds:   Outpatient Medications Prior to Visit  Medication Sig Dispense Refill  . Artificial Tear Ointment (ARTIFICIAL TEARS) ointment Place 1 application into both eyes daily.     . carboxymethylcellulose (REFRESH PLUS) 0.5 % SOLN Place 1 drop into both eyes 2 (two) times daily.     . cetirizine (ZYRTEC) 10 MG tablet Take 10 mg by mouth daily.    . clobetasol cream (TEMOVATE) 2.63 % Apply 1 application topically 2 (two) times daily.     . COMBIVENT RESPIMAT 20-100 MCG/ACT AERS respimat INHALE 1 PUFF 4 TIMES A DAY (NO MORE THAN 6 PUFFS PER DAY) 4 g 5  . fish oil-omega-3 fatty acids 1000 MG capsule Take 2 g by mouth daily.    Marland Kitchen HYDROcodone-acetaminophen (NORCO/VICODIN) 5-325 MG tablet Take 1 tablet by mouth every 6 (six) hours as needed for moderate pain. 30 tablet 0  . hydrocortisone valerate cream (WESTCORT) 0.2 % Apply 1 application topically daily.     Marland Kitchen ibuprofen (ADVIL,MOTRIN) 200 MG tablet Take 200-400 mg by mouth every 6 (six) hours as needed for mild pain or moderate pain.    . methotrexate (RHEUMATREX) 2.5 MG tablet Take 15 mg by mouth once a week. Caution:Chemotherapy. Protect from light. Take on sundays Pt takes 6 pills every Sunday to equal 15mg     . montelukast (SINGULAIR) 10 MG tablet TAKE 1 TABLET BY MOUTH EVERY DAY FOR ALLERGIES AND ASTHMA 90 tablet 3  . Multiple Vitamins-Minerals (PRESERVISION AREDS) CAPS Take 1 capsule by mouth daily.    . pimecrolimus (ELIDEL) 1 % cream Apply topically 2  (two) times daily.    . prednisoLONE acetate (PRED FORTE) 1 % ophthalmic suspension Place 1 drop into both eyes daily.  0  . SYMBICORT 160-4.5 MCG/ACT inhaler INHALE 2 PUFFS TWICE A DAY 10.2 Inhaler 3  . tiZANidine (ZANAFLEX) 4 MG tablet TAKE 1 TABLET (4 MG TOTAL) BY MOUTH EVERY 6 (SIX) HOURS AS NEEDED FOR MUSCLE SPASMS. 60 tablet 2  . folic acid (FOLVITE) 785 MCG tablet Take 1,600 mcg by mouth daily. Patient not taking on Mondays    . ondansetron (ZOFRAN) 4 MG tablet Take 1 tablet (4 mg total) by mouth every 8 (eight) hours as needed for nausea or vomiting. 6 tablet 0  . PATADAY 0.2 %  SOLN Place 1 drop into both eyes daily.    . predniSONE (DELTASONE) 10 MG tablet Take 1 tablet by mouth daily as needed (atopic dermatitis). Per Dermatologist     No facility-administered medications prior to visit.     Allergies:  Allergies  Allergen Reactions  . Coconut Oil Hives and Itching    MAKES THROAT ITCH, ALSO  . Other Hives and Itching    NO TREE NUTS  . Peanut-Containing Drug Products Hives and Itching    MAKES THROAT ITCH, ALSO  . Latex Rash      Review of Systems: See HPI for pertinent ROS. All other ROS negative.    Physical Exam: Blood pressure 140/88, pulse 87, temperature 97.6 F (36.4 C), temperature source Oral, resp. rate 16, height 5\' 5"  (1.651 m), weight 210 lb 3.2 oz (95.3 kg), SpO2 98 %., Body mass index is 34.98 kg/m. General: Overweight WM with moderate abdominal obesity.  Appears in no acute distress. Neck: Supple. No thyromegaly. No lymphadenopathy. No carotid bruit. Lungs: Clear bilaterally to auscultation without wheezes, rales, or rhonchi. Breathing is unlabored. Heart: Regular rhythm. No murmurs, rubs, or gallops. Extremities/Skin: Warm and dry. Signficant Dermatitis, managed by specialists.  Neuro: Alert and oriented X 3. Moves all extremities spontaneously. Gait is normal. CNII-XII grossly in tact. Psych:  Responds to questions appropriately with a normal  affect.     ASSESSMENT AND PLAN:  46 y.o. year old male with   GLUCOSE INTOLERANCE  Hyperglycemia --Frequent Prednisone use - Hemoglobin A1C, fingerstick  If A1c remains stable can wait 6 months for follow-up office visit. Follow up sooner if needed.  Given his frequent prednisone use would need to remember to do a bone density scan at a younger age than usual.    2. Bilateral low back pain without sciatica, unspecified chronicity - tiZANidine (ZANAFLEX) 4 MG tablet; TAKE 1 TABLET (4 MG TOTAL) BY MOUTH EVERY 6 (SIX) HOURS AS NEEDED FOR MUSCLE SPASMS.  Dispense: 60 tablet; Refill: 2 - HYDROcodone-acetaminophen (NORCO/VICODIN) 5-325 MG tablet; Take 1 tablet by mouth every 6 (six) hours as needed for moderate pain.  Dispense: 30 tablet; Refill: 0   Can wait until age 4 to start prostate cancer screening and colon cancer screening.  Lipid Panel was checked 10/21/15. He actually had had some sweet tea that morning but otherwise was fasting. Triglycerides 219. HDL 48. LDL 86.     Acute gout of left foot, unspecified cause-----at OV 09/2015 he reported one episode of symptoms that sounded like possible gout. That visit uric acid level was checked and was normal at 5.9. He has had no recurrence of those type symptoms.   Immunizations: Has been getting influenza vaccines routinely. Received 08/26/2015 He has had Pneumovax 23. Tdap----Given here 10/21/2015  Signed, Karis Juba, PA, BSFM 05/03/2017 2:30 PM

## 2017-05-23 DIAGNOSIS — R69 Illness, unspecified: Secondary | ICD-10-CM | POA: Diagnosis not present

## 2017-06-04 ENCOUNTER — Other Ambulatory Visit: Payer: Self-pay | Admitting: Physician Assistant

## 2017-06-05 NOTE — Telephone Encounter (Signed)
Refill appropriate 

## 2017-07-03 ENCOUNTER — Telehealth: Payer: Self-pay | Admitting: Family Medicine

## 2017-07-03 DIAGNOSIS — M545 Low back pain, unspecified: Secondary | ICD-10-CM

## 2017-07-03 NOTE — Telephone Encounter (Signed)
Patient is requesting a refill on Hydrocodone - Ok to refill??       LRF & LOV - 05/03/17

## 2017-07-03 NOTE — Telephone Encounter (Signed)
Approved. # 30 + 0. 

## 2017-07-04 MED ORDER — HYDROCODONE-ACETAMINOPHEN 5-325 MG PO TABS: 1.0000 | ORAL_TABLET | Freq: Four times a day (QID) | ORAL | 0 refills | Status: DC | PRN

## 2017-07-04 NOTE — Telephone Encounter (Signed)
RX printed, left up front and patient aware to pick up via vm 

## 2017-08-14 DIAGNOSIS — Z79899 Other long term (current) drug therapy: Secondary | ICD-10-CM | POA: Diagnosis not present

## 2017-08-14 DIAGNOSIS — L2089 Other atopic dermatitis: Secondary | ICD-10-CM | POA: Diagnosis not present

## 2017-08-14 DIAGNOSIS — R69 Illness, unspecified: Secondary | ICD-10-CM | POA: Diagnosis not present

## 2017-08-29 ENCOUNTER — Other Ambulatory Visit: Payer: Self-pay

## 2017-08-29 MED ORDER — MONTELUKAST SODIUM 10 MG PO TABS: ORAL_TABLET | ORAL | 0 refills | Status: DC

## 2017-08-31 ENCOUNTER — Telehealth: Payer: Self-pay | Admitting: Physician Assistant

## 2017-08-31 DIAGNOSIS — M545 Low back pain, unspecified: Secondary | ICD-10-CM

## 2017-08-31 MED ORDER — HYDROCODONE-ACETAMINOPHEN 5-325 MG PO TABS: 1.0000 | ORAL_TABLET | Freq: Four times a day (QID) | ORAL | 0 refills | Status: DC | PRN

## 2017-08-31 NOTE — Telephone Encounter (Signed)
Approved. # 30 + 0. 

## 2017-08-31 NOTE — Telephone Encounter (Signed)
Pt needs refill on hydrocodone.  °

## 2017-08-31 NOTE — Telephone Encounter (Signed)
DR printed lvm patient aware rx can be picked up

## 2017-08-31 NOTE — Telephone Encounter (Signed)
Last OV 05/03/2017 Last refill 9/11 Ok to refill?

## 2017-09-20 ENCOUNTER — Other Ambulatory Visit: Payer: Self-pay

## 2017-09-20 MED ORDER — BUDESONIDE-FORMOTEROL FUMARATE 160-4.5 MCG/ACT IN AERO: 2.0000 | INHALATION_SPRAY | Freq: Two times a day (BID) | RESPIRATORY_TRACT | 3 refills | Status: DC

## 2017-11-02 ENCOUNTER — Other Ambulatory Visit: Payer: Self-pay

## 2017-11-02 ENCOUNTER — Ambulatory Visit: Payer: Medicare HMO | Admitting: Physician Assistant

## 2017-11-02 DIAGNOSIS — M545 Low back pain, unspecified: Secondary | ICD-10-CM

## 2017-11-02 NOTE — Telephone Encounter (Signed)
Last OV 05/03/2017 Last refill 08/31/2017 Ok to refill? This can be sent electronic

## 2017-11-03 ENCOUNTER — Other Ambulatory Visit: Payer: Self-pay

## 2017-11-03 DIAGNOSIS — M545 Low back pain, unspecified: Secondary | ICD-10-CM

## 2017-11-03 MED ORDER — HYDROCODONE-ACETAMINOPHEN 5-325 MG PO TABS: 1.0000 | ORAL_TABLET | Freq: Four times a day (QID) | ORAL | 0 refills | Status: DC | PRN

## 2017-11-03 NOTE — Telephone Encounter (Signed)
Last OV 05/03/2017 Last refill 08/31/2017 Ok to refill?

## 2017-11-06 NOTE — Telephone Encounter (Signed)
Call was placed to patient that his rx was sent to his pharmacy. Patient was not available

## 2017-11-15 ENCOUNTER — Encounter: Payer: Self-pay | Admitting: Physician Assistant

## 2017-11-15 ENCOUNTER — Other Ambulatory Visit: Payer: Self-pay

## 2017-11-15 ENCOUNTER — Ambulatory Visit (INDEPENDENT_AMBULATORY_CARE_PROVIDER_SITE_OTHER): Payer: Medicare HMO | Admitting: Physician Assistant

## 2017-11-15 VITALS — BP 130/90 | HR 97 | Temp 97.8°F | Resp 16 | Ht 65.0 in | Wt 193.6 lb

## 2017-11-15 DIAGNOSIS — R739 Hyperglycemia, unspecified: Secondary | ICD-10-CM

## 2017-11-15 DIAGNOSIS — E739 Lactose intolerance, unspecified: Secondary | ICD-10-CM

## 2017-11-15 NOTE — Progress Notes (Signed)
Patient ID: ISIAC BREIGHNER MRN: [id number], DOB: [birth year], 47 y.o. Date of Encounter: 11/15/2017, 2:04 PM    Chief Complaint:  Chief Complaint  Patient presents with  . 6 month follow up     HPI: 47 y.o. year old white male for 6 month followup visit.   Here for a routine six-month followup regarding history of hyperglycemia. At office visit 03/2014 he reported that he had cut back on sodas and carb intake. At [place] 09/2014 he says that he has definitely decreased intake of fast food a lot. Says he does rarely drinks soda but says that he is drinking sweet tea. Says that he is trying to eat smaller portions and has decreased bread intake. Asked if he still has his handout regarding low carbohydrate diet and he says "you better give me another one"--so I did give him another handout again today 09/2014.  At [place] 03/2015--says he thought he lost a few pounds since the last visit. We checked and 09/2014 weight was 217. 2016 weight down to 210. Down 10 pounds. Says that since he has moved out towards [facility] he is not as close to fast food as he was. Says that he is also decreased portion sizes. Says he is doing no exercise but these diet changes him are what has gotten his weight loss. At [place] 09/2015--says that he has continued with his diet changes. Weight today 208.  At his OV 03/2014 he pulled up his shirt and showed me the significant dermatitis he currently had. As well, he showed me his notepad where he has documented notes which includes recent prednisone.  At [place] 09/2014 he has a Building services engineer document where he is keeping records of prednisone dose.  Since 06/2014 he has been on either prednisone 20 mg or 10 mg depending on the amount of dermatitis at different times.  He says that this is being prescribed by his dermatologist and managed by dermatology.  At [place] 03/2015 he states that dermatology recently increased his methotrexate from 4 pills to 6 pills and increase his  folic acid dose. Says dermatology gives him prednisone to take it his discretion. Had to take some low-dose 10 mg about 2 weeks ago.  At [place] 09/2015--he says that he has not had to use any prednisone in a couple of months.o/w, in general, dermatitis has been "about the same".  At [place] 09/2015 he says that recently woke up one morning with pain swelling and decreased range of motion in his right first toe. Says that he had had no known trauma or injury. As it has gradually improved but there is still a slight ache present there in the range of motion is still slightly decreased compared to the first toe on the left foot. Says he has no prior history of gout but a friend of his told him it may be gout either arthritis from injury when he was younger.  04/21/2016: Is that his skin has been fairly stable recently. At visit 09/2015 he reported an episode of what we thought might be gout. Uric acid level at that visit and this came back normal at 5.9. Reviewed this with him today. Today he says that he has had no recurrent episodes similar to what he had had at last visit and no episodes consistent with gout flare. No other complaints or concerns today.   11/02/2016: Today he says that his skin has been flaring recently. Pulls up his pant leg and shows me really  bad rash on the backs of his legs. Says that his methotrexate was increased a couple of years ago and it had been helping but now is having bad flares again. Says that he should've brought in his spreadsheet with his prednisone doses documented but forgot it. Says that just recently the Combivent is costing him $100. Says it was just costing him $40 and says that the Symbicort is just $40. Told him to see if his pharmacy can tell him what would be cheaper options or either contact his insurance company to find out. I will be happy to change medicine to a cheaper option once he finds out information. No other concerns today. Here to recheck  A1c.  46/08/2017: States things have been pretty stable since LOV.  Regarding Prednisone use--has to take 5mg  - 10mg  occasionally. Says it gets worse in winter.  Is needing refill on pain med and muscle relaxer--tizanidine.  Has occasional low back pain. No pain, numbness, tingling down either leg. Says 90 - 95% of the time no back pain. But occasional spasm that causes him to feel "jerk" ing sensation. Then later after that will feel pain. No other concerns to address today.   11/15/2017: Today he asked what his weight is reading today.  Reviewed that today is at 46 and that at last visit in July he was  210 pounds. He states that's "because of lifestyle changes ".  Says that he was living with his father and his father passed away.  Patient states that he has moved out of the city.  Therefore has decreased the amount of fast food that he was eating and now is cooking at home.  Also says that his father always had food in the house and it was always there and available to eat.  Says now he has to make an effort to go to the store get food and cook it etc. so just has not been eating as much. States that his back pain "comes and goes" but overall is stable. States that his skin has been relatively stable.  Says that he has been on no oral prednisone for 8 months now.  Did get an injection of prednisone at last visit with his allergy doctor.  His eyes have been flared up recently and has been using the steroid eyedrops for that. Is using Symbicort as directed.  Feels that his breathing is stable. Overall feels like things are stable. Had no recurrent flares of symptoms that were similar to gout in the past.     Home Meds:   Outpatient Medications Prior to Visit  Medication Sig Dispense Refill  . Artificial Tear Ointment (ARTIFICIAL TEARS) ointment Place 1 application into both eyes daily.     . budesonide-formoterol (SYMBICORT) 160-4.5 MCG/ACT inhaler Inhale 2 puffs into the lungs 2 (two)  times daily. 10.2 Inhaler 3  . carboxymethylcellulose (REFRESH PLUS) 0.5 % SOLN Place 1 drop into both eyes 2 (two) times daily.     . cetirizine (ZYRTEC) 10 MG tablet Take 10 mg by mouth daily.    . clobetasol cream (TEMOVATE) 2.54 % Apply 1 application topically 2 (two) times daily.     . fish oil-omega-3 fatty acids 1000 MG capsule Take 2 g by mouth daily.    . folic acid (FOLVITE) 1 MG tablet Take 1 mg by mouth daily.  8  . HYDROcodone-acetaminophen (NORCO/VICODIN) 5-325 MG tablet Take 1 tablet by mouth every 6 (six) hours as needed for moderate pain. 30 tablet  0  . hydrocortisone valerate cream (WESTCORT) 0.2 % Apply 1 application topically daily.     Marland Kitchen ibuprofen (ADVIL,MOTRIN) 200 MG tablet Take 200-400 mg by mouth every 6 (six) hours as needed for mild pain or moderate pain.    . methotrexate (RHEUMATREX) 2.5 MG tablet Take 15 mg by mouth once a week. Caution:Chemotherapy. Protect from light. Take on sundays Pt takes 6 pills every Sunday to equal 15mg     . montelukast (SINGULAIR) 10 MG tablet TAKE 1 TABLET BY MOUTH EVERY DAY FOR ALLERGIES AND ASTHMA 90 tablet 0  . Multiple Vitamins-Minerals (PRESERVISION AREDS) CAPS Take 1 capsule by mouth daily.    . pimecrolimus (ELIDEL) 1 % cream Apply topically 2 (two) times daily.    . prednisoLONE acetate (PRED FORTE) 1 % ophthalmic suspension Place 1 drop into both eyes daily.  0  . tiZANidine (ZANAFLEX) 4 MG tablet TAKE 1 TABLET (4 MG TOTAL) BY MOUTH EVERY 6 (SIX) HOURS AS NEEDED FOR MUSCLE SPASMS. 60 tablet 2  . COMBIVENT RESPIMAT 20-100 MCG/ACT AERS respimat INHALE 1 PUFF 4 TIMES A DAY (NO MORE THAN 6 PUFFS PER DAY) 4 g 5   No facility-administered medications prior to visit.     Allergies:  Allergies  Allergen Reactions  . Coconut Oil Hives and Itching    MAKES THROAT ITCH, ALSO  . Other Hives and Itching    NO TREE NUTS  . Peanut-Containing Drug Products Hives and Itching    MAKES THROAT ITCH, ALSO  . Latex Rash      Review of  Systems: See HPI for pertinent ROS. All other ROS negative.    Physical Exam: Blood pressure 130/90, pulse 97, temperature 97.8 F (36.6 C), temperature source Oral, resp. rate 16, height 5\' 5"  (1.651 m), weight 87.8 kg (193 lb 9.6 oz), SpO2 98 %., There is no height or weight on file to calculate BMI. General: Overweight WM with moderate abdominal obesity.  Appears in no acute distress. Neck: Supple. No thyromegaly. No lymphadenopathy. No carotid bruit. Lungs: Clear bilaterally to auscultation without wheezes, rales, or rhonchi. Breathing is unlabored. Heart: Regular rhythm. No murmurs, rubs, or gallops. Extremities/Skin: Warm and dry. Signficant Dermatitis, managed by specialists.  Today-- The edges of his eyelids have erythema and swelling.  --His skin is stable. Mostly with chronic skin changes---no acute inflamed areas Neuro: Alert and oriented X 3. Moves all extremities spontaneously. Gait is normal. CNII-XII grossly in tact. Psych:  Responds to questions appropriately with a normal affect.     ASSESSMENT AND PLAN:  47 y.o. year old male with   GLUCOSE INTOLERANCE  Hyperglycemia --Frequent Prednisone use - Hemoglobin A1C, fingerstick  If A1c remains stable can wait 6 months for follow-up office visit. Follow up sooner if needed.  Given his frequent prednisone use would need to remember to do a bone density scan at a younger age than usual.    Bilateral low back pain without sciatica, unspecified chronicity --Continue Zenaflex and Hydrocodone as needed   Can wait until age 11 to start prostate cancer screening and colon cancer screening.  Lipid Panel was checked 10/21/15. He actually had had some sweet tea that morning but otherwise was fasting. Triglycerides 219. HDL 48. LDL 86.     Acute gout of left foot, unspecified cause-----at OV 09/2015 he reported one episode of symptoms that sounded like possible gout. That visit uric acid level was checked and was normal at  5.9. He has had no recurrence of those type symptoms.  Immunizations: Has been getting influenza vaccines each year. He has had Pneumovax 23. Tdap----Given here 10/21/2015   Signed, Karis Juba, PA, BSFM 11/15/2017 2:04 PM

## 2017-11-16 LAB — HEMOGLOBIN A1C
Hgb A1c MFr Bld: 5.6 % of total Hgb (ref <5.7)
Mean Plasma Glucose: 114 (calc)
eAG (mmol/L): 6.3 (calc)

## 2017-11-23 DIAGNOSIS — R69 Illness, unspecified: Secondary | ICD-10-CM | POA: Diagnosis not present

## 2017-11-27 DIAGNOSIS — H01024 Squamous blepharitis left upper eyelid: Secondary | ICD-10-CM | POA: Diagnosis not present

## 2017-11-27 DIAGNOSIS — H01025 Squamous blepharitis left lower eyelid: Secondary | ICD-10-CM | POA: Diagnosis not present

## 2017-11-27 DIAGNOSIS — H01021 Squamous blepharitis right upper eyelid: Secondary | ICD-10-CM | POA: Diagnosis not present

## 2017-11-27 DIAGNOSIS — H01022 Squamous blepharitis right lower eyelid: Secondary | ICD-10-CM | POA: Diagnosis not present

## 2017-12-22 DIAGNOSIS — H04123 Dry eye syndrome of bilateral lacrimal glands: Secondary | ICD-10-CM | POA: Diagnosis not present

## 2017-12-22 DIAGNOSIS — H16041 Marginal corneal ulcer, right eye: Secondary | ICD-10-CM | POA: Diagnosis not present

## 2017-12-22 DIAGNOSIS — H10413 Chronic giant papillary conjunctivitis, bilateral: Secondary | ICD-10-CM | POA: Diagnosis not present

## 2017-12-25 DIAGNOSIS — H01021 Squamous blepharitis right upper eyelid: Secondary | ICD-10-CM | POA: Diagnosis not present

## 2017-12-25 DIAGNOSIS — H16253 Phlyctenular keratoconjunctivitis, bilateral: Secondary | ICD-10-CM | POA: Diagnosis not present

## 2017-12-25 DIAGNOSIS — H16041 Marginal corneal ulcer, right eye: Secondary | ICD-10-CM | POA: Diagnosis not present

## 2017-12-25 DIAGNOSIS — S0501XD Injury of conjunctiva and corneal abrasion without foreign body, right eye, subsequent encounter: Secondary | ICD-10-CM | POA: Diagnosis not present

## 2017-12-27 DIAGNOSIS — H01021 Squamous blepharitis right upper eyelid: Secondary | ICD-10-CM | POA: Diagnosis not present

## 2017-12-27 DIAGNOSIS — S0501XD Injury of conjunctiva and corneal abrasion without foreign body, right eye, subsequent encounter: Secondary | ICD-10-CM | POA: Diagnosis not present

## 2017-12-27 DIAGNOSIS — H16253 Phlyctenular keratoconjunctivitis, bilateral: Secondary | ICD-10-CM | POA: Diagnosis not present

## 2017-12-27 DIAGNOSIS — H16041 Marginal corneal ulcer, right eye: Secondary | ICD-10-CM | POA: Diagnosis not present

## 2017-12-27 DIAGNOSIS — H01022 Squamous blepharitis right lower eyelid: Secondary | ICD-10-CM | POA: Diagnosis not present

## 2018-01-01 ENCOUNTER — Other Ambulatory Visit: Payer: Self-pay

## 2018-01-01 DIAGNOSIS — H16253 Phlyctenular keratoconjunctivitis, bilateral: Secondary | ICD-10-CM | POA: Diagnosis not present

## 2018-01-01 DIAGNOSIS — M545 Low back pain, unspecified: Secondary | ICD-10-CM

## 2018-01-01 DIAGNOSIS — H01022 Squamous blepharitis right lower eyelid: Secondary | ICD-10-CM | POA: Diagnosis not present

## 2018-01-01 DIAGNOSIS — H01025 Squamous blepharitis left lower eyelid: Secondary | ICD-10-CM | POA: Diagnosis not present

## 2018-01-01 DIAGNOSIS — H01021 Squamous blepharitis right upper eyelid: Secondary | ICD-10-CM | POA: Diagnosis not present

## 2018-01-01 DIAGNOSIS — H01024 Squamous blepharitis left upper eyelid: Secondary | ICD-10-CM | POA: Diagnosis not present

## 2018-01-01 MED ORDER — HYDROCODONE-ACETAMINOPHEN 5-325 MG PO TABS: 1.0000 | ORAL_TABLET | Freq: Four times a day (QID) | ORAL | 0 refills | Status: DC | PRN

## 2018-01-01 NOTE — Telephone Encounter (Signed)
Last OV 05/03/2017 Last refill  11/03/2017 Ok to refill?

## 2018-01-22 DIAGNOSIS — H1711 Central corneal opacity, right eye: Secondary | ICD-10-CM | POA: Diagnosis not present

## 2018-01-22 DIAGNOSIS — H10413 Chronic giant papillary conjunctivitis, bilateral: Secondary | ICD-10-CM | POA: Diagnosis not present

## 2018-02-17 ENCOUNTER — Other Ambulatory Visit: Payer: Self-pay | Admitting: Physician Assistant

## 2018-03-05 ENCOUNTER — Other Ambulatory Visit: Payer: Self-pay

## 2018-03-05 DIAGNOSIS — M545 Low back pain, unspecified: Secondary | ICD-10-CM

## 2018-03-05 MED ORDER — HYDROCODONE-ACETAMINOPHEN 5-325 MG PO TABS: 1.0000 | ORAL_TABLET | Freq: Four times a day (QID) | ORAL | 0 refills | Status: DC | PRN

## 2018-03-05 NOTE — Telephone Encounter (Signed)
Last OV 11/15/2017 Last refill  01/01/2018 Ok to refill

## 2018-03-06 DIAGNOSIS — Z79899 Other long term (current) drug therapy: Secondary | ICD-10-CM | POA: Diagnosis not present

## 2018-03-06 DIAGNOSIS — L2089 Other atopic dermatitis: Secondary | ICD-10-CM | POA: Diagnosis not present

## 2018-03-06 DIAGNOSIS — L400 Psoriasis vulgaris: Secondary | ICD-10-CM | POA: Diagnosis not present

## 2018-03-28 DIAGNOSIS — H10413 Chronic giant papillary conjunctivitis, bilateral: Secondary | ICD-10-CM | POA: Diagnosis not present

## 2018-03-28 DIAGNOSIS — H1711 Central corneal opacity, right eye: Secondary | ICD-10-CM | POA: Diagnosis not present

## 2018-04-17 ENCOUNTER — Other Ambulatory Visit: Payer: Self-pay | Admitting: Physician Assistant

## 2018-04-30 ENCOUNTER — Other Ambulatory Visit: Payer: Self-pay

## 2018-04-30 DIAGNOSIS — M545 Low back pain, unspecified: Secondary | ICD-10-CM

## 2018-04-30 MED ORDER — HYDROCODONE-ACETAMINOPHEN 5-325 MG PO TABS: 1.0000 | ORAL_TABLET | Freq: Four times a day (QID) | ORAL | 0 refills | Status: DC | PRN

## 2018-04-30 NOTE — Telephone Encounter (Signed)
Last OV 11/15/2017 Last refill 03/05/2018 Ok to refill?

## 2018-05-11 ENCOUNTER — Other Ambulatory Visit: Payer: Self-pay | Admitting: Physician Assistant

## 2018-05-16 ENCOUNTER — Other Ambulatory Visit: Payer: Self-pay

## 2018-05-16 ENCOUNTER — Encounter: Payer: Self-pay | Admitting: Physician Assistant

## 2018-05-16 ENCOUNTER — Ambulatory Visit (INDEPENDENT_AMBULATORY_CARE_PROVIDER_SITE_OTHER): Payer: Medicare Other | Admitting: Physician Assistant

## 2018-05-16 VITALS — BP 136/92 | HR 78 | Temp 97.8°F | Resp 18 | Ht 65.0 in | Wt 185.2 lb

## 2018-05-16 DIAGNOSIS — G8929 Other chronic pain: Secondary | ICD-10-CM

## 2018-05-16 DIAGNOSIS — R739 Hyperglycemia, unspecified: Secondary | ICD-10-CM

## 2018-05-16 DIAGNOSIS — M545 Low back pain, unspecified: Secondary | ICD-10-CM

## 2018-05-16 NOTE — Progress Notes (Signed)
Patient ID: ELIE LEPPO MRN: [id number], DOB: 1971/07/01, 47 y.o. Date of Encounter: 05/16/2018, 2:06 PM    Chief Complaint:  Chief Complaint  Patient presents with  . 6 month follow up anxiety     HPI: 47 y.o. year old white male for 6 month followup visit.   Here for a routine six-month followup regarding history of hyperglycemia. At office visit 03/2014 he reported that he had cut back on sodas and carb intake. At [place] 09/2014 he says that he has definitely decreased intake of fast food a lot. Says he does rarely drinks soda but says that he is drinking sweet tea. Says that he is trying to eat smaller portions and has decreased bread intake. Asked if he still has his handout regarding low carbohydrate diet and he says "you better give me another one"--so I did give him another handout again today 09/2014.  At [place] 03/2015--says he thought he lost a few pounds since the last visit. We checked and 09/2014 weight was 217. 2016 weight down to 210. Down 10 pounds. Says that since he has moved out towards [facility] he is not as close to fast food as he was. Says that he is also decreased portion sizes. Says he is doing no exercise but these diet changes him are what has gotten his weight loss. At [place] 09/2015--says that he has continued with his diet changes. Weight today 208.  At his OV 03/2014 he pulled up his shirt and showed me the significant dermatitis he currently had. As well, he showed me his notepad where he has documented notes which includes recent prednisone.  At [place] 09/2014 he has a Building services engineer document where he is keeping records of prednisone dose.  Since 06/2014 he has been on either prednisone 20 mg or 10 mg depending on the amount of dermatitis at different times.  He says that this is being prescribed by his dermatologist and managed by dermatology.  At [place] 03/2015 he states that dermatology recently increased his methotrexate from 4 pills to 6 pills and  increase his folic acid dose. Says dermatology gives him prednisone to take it his discretion. Had to take some low-dose 10 mg about 2 weeks ago.  At [place] 09/2015--he says that he has not had to use any prednisone in a couple of months.o/w, in general, dermatitis has been "about the same".  At [place] 09/2015 he says that recently woke up one morning with pain swelling and decreased range of motion in his right first toe. Says that he had had no known trauma or injury. As it has gradually improved but there is still a slight ache present there in the range of motion is still slightly decreased compared to the first toe on the left foot. Says he has no prior history of gout but a friend of his told him it may be gout either arthritis from injury when he was younger.  04/21/2016: Is that his skin has been fairly stable recently. At visit 09/2015 he reported an episode of what we thought might be gout. Uric acid level at that visit and this came back normal at 5.9. Reviewed this with him today. Today he says that he has had no recurrent episodes similar to what he had had at last visit and no episodes consistent with gout flare. No other complaints or concerns today.   11/02/2016: Today he says that his skin has been flaring recently. Pulls up his pant leg and shows me  really bad rash on the backs of his legs. Says that his methotrexate was increased a couple of years ago and it had been helping but now is having bad flares again. Says that he should've brought in his spreadsheet with his prednisone doses documented but forgot it. Says that just recently the Combivent is costing him $100. Says it was just costing him $40 and says that the Symbicort is just $40. Told him to see if his pharmacy can tell him what would be cheaper options or either contact his insurance company to find out. I will be happy to change medicine to a cheaper option once he finds out information. No other concerns today. Here to  recheck A1c.  05/03/2017: States things have been pretty stable since LOV.  Regarding Prednisone use--has to take 5mg  - 10mg  occasionally. Says it gets worse in winter.  Is needing refill on pain med and muscle relaxer--tizanidine.  Has occasional low back pain. No pain, numbness, tingling down either leg. Says 90 - 95% of the time no back pain. But occasional spasm that causes him to feel "jerk" ing sensation. Then later after that will feel pain. No other concerns to address today.   11/15/2017: Today he asked what his weight is reading today.  Reviewed that today is at 34 and that at last visit in July he was  210 pounds. He states that's "because of lifestyle changes ".  Says that he was living with his father and his father passed away.  Patient states that he has moved out of the city.  Therefore has decreased the amount of fast food that he was eating and now is cooking at home.  Also says that his father always had food in the house and it was always there and available to eat.  Says now he has to make an effort to go to the store get food and cook it etc. so just has not been eating as much. States that his back pain "comes and goes" but overall is stable. States that his skin has been relatively stable.  Says that he has been on no oral prednisone for 8 months now.  Did get an injection of prednisone at last visit with his allergy doctor.  His eyes have been flared up recently and has been using the steroid eyedrops for that. Is using Symbicort as directed.  Feels that his breathing is stable. Overall feels like things are stable. Had no recurrent flares of symptoms that were similar to gout in the past.   05/16/2018: Today reviewed that his weight is down to 185.  He states that it is continued to decrease secondary to continuing to eat healthier more whole some foods and less portions.  See note from last visit 11/15/2017. He reports that overall things have been pretty stable and  going pretty good.  States that regarding his back pain that that can be off and on.  Says that he can have good days with very little back pain but then can have some days with some back pain.  Dates that his skin has been pretty stable recently.  Has had no prednisone in a couple months.  Is that the eye doctor has been able to adjust his medication and his eyes have been doing better and that the last time his skin flared his eyes did not flare and did not worsen.  That has been good.  Has no other specific concerns to address today.  Here to recheck  A1c to monitor her sugar.   Home Meds:   Outpatient Medications Prior to Visit  Medication Sig Dispense Refill  . Artificial Tear Ointment (ARTIFICIAL TEARS) ointment Place 1 application into both eyes daily.     Marland Kitchen BEPREVE 1.5 % SOLN Place 1 drop into both eyes 2 (two) times daily.  11  . carboxymethylcellulose (REFRESH PLUS) 0.5 % SOLN Place 1 drop into both eyes 2 (two) times daily.     . cetirizine (ZYRTEC) 10 MG tablet Take 10 mg by mouth daily.    . clobetasol cream (TEMOVATE) 7.61 % Apply 1 application topically 2 (two) times daily.     . fish oil-omega-3 fatty acids 1000 MG capsule Take 2 g by mouth daily.    . folic acid (FOLVITE) 1 MG tablet Take 1 mg by mouth daily.  8  . HYDROcodone-acetaminophen (NORCO/VICODIN) 5-325 MG tablet Take 1 tablet by mouth every 6 (six) hours as needed for moderate pain. 30 tablet 0  . hydrocortisone valerate cream (WESTCORT) 0.2 % Apply 1 application topically daily.     Marland Kitchen ibuprofen (ADVIL,MOTRIN) 200 MG tablet Take 200-400 mg by mouth every 6 (six) hours as needed for mild pain or moderate pain.    . methotrexate (RHEUMATREX) 2.5 MG tablet Take 15 mg by mouth once a week. Caution:Chemotherapy. Protect from light. Take on sundays Pt takes 6 pills every Sunday to equal 15mg     . montelukast (SINGULAIR) 10 MG tablet TAKE 1 TABLET BY MOUTH EVERY DAY FOR ALLERGIES AND ASTHMA 90 tablet 0  . pimecrolimus (ELIDEL)  1 % cream Apply topically 2 (two) times daily.    . prednisoLONE acetate (PRED FORTE) 1 % ophthalmic suspension Place 1 drop into both eyes daily.  0  . SYMBICORT 160-4.5 MCG/ACT inhaler INHALE 2 PUFFS INTO THE LUNGS TWICE A DAY 10.2 Inhaler 2  . tiZANidine (ZANAFLEX) 4 MG tablet TAKE 1 TABLET (4 MG TOTAL) BY MOUTH EVERY 6 (SIX) HOURS AS NEEDED FOR MUSCLE SPASMS. 60 tablet 2  . Multiple Vitamins-Minerals (PRESERVISION AREDS) CAPS Take 1 capsule by mouth daily.     No facility-administered medications prior to visit.     Allergies:  Allergies  Allergen Reactions  . Coconut Oil Hives and Itching    MAKES THROAT ITCH, ALSO  . Other Hives and Itching    NO TREE NUTS  . Peanut-Containing Drug Products Hives and Itching    MAKES THROAT ITCH, ALSO  . Latex Rash      Review of Systems: See HPI for pertinent ROS. All other ROS negative.    Physical Exam: Blood pressure (!) 136/92, pulse 78, temperature 97.8 F (36.6 C), temperature source Oral, resp. rate 18, height 5\' 5"  (1.651 m), weight 84 kg (185 lb 3.2 oz), SpO2 98 %., Body mass index is 30.82 kg/m. General:  WNWD WM. Appears in no acute distress. Neck: Supple. No thyromegaly. No lymphadenopathy. Lungs: Clear bilaterally to auscultation without wheezes, rales, or rhonchi. Breathing is unlabored. Heart: RRR with S1 S2. No murmurs, rubs, or gallops. Abdomen: Soft, non-tender, non-distended with normoactive bowel sounds. No hepatomegaly. No rebound/guarding. No obvious abdominal masses. Musculoskeletal:  Strength and tone normal for age. Extremities/Skin: Warm and dry. Skin around his eyes looks improved. Has some areas of scabs secondary to itch-scratch cycle-- on calves--- but no other active rash. Neuro: Alert and oriented X 3. Moves all extremities spontaneously. Gait is normal. CNII-XII grossly in tact. Psych:  Responds to questions appropriately with a normal affect.  ASSESSMENT AND PLAN:  47 y.o. year old male with    GLUCOSE INTOLERANCE  Hyperglycemia --Frequent Prednisone use - Hemoglobin A1C, fingerstick  If A1c remains stable can wait 6 months for follow-up office visit. Follow up sooner if needed.  Given his frequent prednisone use would need to remember to do a bone density scan at a younger age than usual. Good job with the diet changes, weight loss !!   Bilateral low back pain without sciatica, unspecified chronicity --Continue Zenaflex and Hydrocodone as needed   Can wait until age 70 to start prostate cancer screening and colon cancer screening.  Lipid Panel was checked 10/21/15. He actually had had some sweet tea that morning but otherwise was fasting. Triglycerides 219. HDL 48. LDL 86.     Acute gout of left foot, unspecified cause-----at OV 09/2015 he reported one episode of symptoms that sounded like possible gout. That visit uric acid level was checked and was normal at 5.9. He has had no recurrence of those type symptoms.   Immunizations: Has been getting influenza vaccines each year. He has had Pneumovax 23. Tdap----Given here 10/21/2015   Signed, Karis Juba, PA, BSFM 05/16/2018 2:06 PM

## 2018-05-17 LAB — HEMOGLOBIN A1C
Hgb A1c MFr Bld: 5.6 % of total Hgb (ref <5.7)
Mean Plasma Glucose: 114 (calc)
eAG (mmol/L): 6.3 (calc)

## 2018-05-21 ENCOUNTER — Other Ambulatory Visit: Payer: Self-pay | Admitting: Physician Assistant

## 2018-06-07 ENCOUNTER — Other Ambulatory Visit: Payer: Self-pay | Admitting: Physician Assistant

## 2018-06-07 DIAGNOSIS — M545 Low back pain, unspecified: Secondary | ICD-10-CM

## 2018-06-08 NOTE — Telephone Encounter (Signed)
Ok to refill 

## 2018-06-12 DIAGNOSIS — L2089 Other atopic dermatitis: Secondary | ICD-10-CM | POA: Diagnosis not present

## 2018-06-12 DIAGNOSIS — Z79899 Other long term (current) drug therapy: Secondary | ICD-10-CM | POA: Diagnosis not present

## 2018-07-04 ENCOUNTER — Other Ambulatory Visit: Payer: Self-pay

## 2018-07-04 DIAGNOSIS — M545 Low back pain, unspecified: Secondary | ICD-10-CM

## 2018-07-04 MED ORDER — HYDROCODONE-ACETAMINOPHEN 5-325 MG PO TABS: 1.0000 | ORAL_TABLET | Freq: Four times a day (QID) | ORAL | 0 refills | Status: DC | PRN

## 2018-07-04 NOTE — Telephone Encounter (Signed)
Last OV 05/16/2018 Last refill 04/30/2018 Ok to refill?

## 2018-07-18 DIAGNOSIS — H1711 Central corneal opacity, right eye: Secondary | ICD-10-CM | POA: Diagnosis not present

## 2018-07-18 DIAGNOSIS — H10413 Chronic giant papillary conjunctivitis, bilateral: Secondary | ICD-10-CM | POA: Diagnosis not present

## 2018-08-15 ENCOUNTER — Other Ambulatory Visit: Payer: Self-pay | Admitting: Physician Assistant

## 2018-08-16 ENCOUNTER — Other Ambulatory Visit: Payer: Self-pay | Admitting: Physician Assistant

## 2018-09-11 ENCOUNTER — Other Ambulatory Visit: Payer: Self-pay | Admitting: Physician Assistant

## 2018-09-11 DIAGNOSIS — M545 Low back pain, unspecified: Secondary | ICD-10-CM

## 2018-09-12 MED ORDER — HYDROCODONE-ACETAMINOPHEN 5-325 MG PO TABS: 1.0000 | ORAL_TABLET | Freq: Four times a day (QID) | ORAL | 0 refills | Status: DC | PRN

## 2018-09-12 NOTE — Telephone Encounter (Signed)
Patient is calling to get hydrocodone refilled  [facility]

## 2018-09-12 NOTE — Telephone Encounter (Signed)
Ok to refill??  Last office visit 05/16/2018.  Last refill 07/04/2018 on Hydrocodone/APAP.

## 2018-09-13 DIAGNOSIS — L2089 Other atopic dermatitis: Secondary | ICD-10-CM | POA: Diagnosis not present

## 2018-09-13 DIAGNOSIS — L0109 Other impetigo: Secondary | ICD-10-CM | POA: Diagnosis not present

## 2018-09-13 DIAGNOSIS — Z79899 Other long term (current) drug therapy: Secondary | ICD-10-CM | POA: Diagnosis not present

## 2018-10-18 ENCOUNTER — Other Ambulatory Visit: Payer: Self-pay | Admitting: Family Medicine

## 2018-11-03 ENCOUNTER — Other Ambulatory Visit: Payer: Self-pay | Admitting: Family Medicine

## 2018-11-05 MED ORDER — BUDESONIDE-FORMOTEROL FUMARATE 160-4.5 MCG/ACT IN AERO: INHALATION_SPRAY | RESPIRATORY_TRACT | 1 refills | Status: DC

## 2018-11-05 NOTE — Addendum Note (Signed)
Addended by: Sheral Flow on: 11/05/2018 09:19 AM   Modules accepted: Orders

## 2018-11-11 ENCOUNTER — Other Ambulatory Visit: Payer: Self-pay | Admitting: Family Medicine

## 2018-11-13 ENCOUNTER — Other Ambulatory Visit: Payer: Self-pay | Admitting: Family Medicine

## 2018-11-13 DIAGNOSIS — M545 Low back pain, unspecified: Secondary | ICD-10-CM

## 2018-11-13 MED ORDER — HYDROCODONE-ACETAMINOPHEN 5-325 MG PO TABS: 1.0000 | ORAL_TABLET | Freq: Four times a day (QID) | ORAL | 0 refills | Status: DC | PRN

## 2018-11-13 NOTE — Telephone Encounter (Signed)
NTBS by me or [facility].

## 2018-11-13 NOTE — Telephone Encounter (Signed)
Has an apt scheduled with [facility] in 11/15/18

## 2018-11-13 NOTE — Telephone Encounter (Signed)
Patient is requesting a refill on Hydrocodone   LOV: 05/16/18  LRF:   09/12/18

## 2018-11-15 ENCOUNTER — Ambulatory Visit: Payer: Medicare Other | Admitting: Physician Assistant

## 2018-11-15 ENCOUNTER — Ambulatory Visit: Payer: Medicare Other | Admitting: Family Medicine

## 2018-11-19 ENCOUNTER — Encounter: Payer: Self-pay | Admitting: Family Medicine

## 2018-11-19 DIAGNOSIS — Z961 Presence of intraocular lens: Secondary | ICD-10-CM | POA: Diagnosis not present

## 2018-11-19 DIAGNOSIS — H04123 Dry eye syndrome of bilateral lacrimal glands: Secondary | ICD-10-CM | POA: Diagnosis not present

## 2018-11-19 DIAGNOSIS — H1033 Unspecified acute conjunctivitis, bilateral: Secondary | ICD-10-CM | POA: Diagnosis not present

## 2018-11-19 DIAGNOSIS — H179 Unspecified corneal scar and opacity: Secondary | ICD-10-CM | POA: Diagnosis not present

## 2018-11-19 DIAGNOSIS — H2512 Age-related nuclear cataract, left eye: Secondary | ICD-10-CM | POA: Diagnosis not present

## 2018-11-30 ENCOUNTER — Ambulatory Visit (INDEPENDENT_AMBULATORY_CARE_PROVIDER_SITE_OTHER): Payer: Medicare Other | Admitting: Family Medicine

## 2018-11-30 ENCOUNTER — Encounter: Payer: Self-pay | Admitting: Family Medicine

## 2018-11-30 VITALS — BP 142/88 | HR 82 | Temp 98.0°F | Resp 18 | Ht 66.0 in | Wt 188.0 lb

## 2018-11-30 DIAGNOSIS — R739 Hyperglycemia, unspecified: Secondary | ICD-10-CM

## 2018-11-30 DIAGNOSIS — G8929 Other chronic pain: Secondary | ICD-10-CM

## 2018-11-30 DIAGNOSIS — J4520 Mild intermittent asthma, uncomplicated: Secondary | ICD-10-CM | POA: Diagnosis not present

## 2018-11-30 DIAGNOSIS — M545 Low back pain, unspecified: Secondary | ICD-10-CM

## 2018-11-30 DIAGNOSIS — Z23 Encounter for immunization: Secondary | ICD-10-CM | POA: Diagnosis not present

## 2018-11-30 DIAGNOSIS — L209 Atopic dermatitis, unspecified: Secondary | ICD-10-CM | POA: Diagnosis not present

## 2018-11-30 NOTE — Addendum Note (Signed)
Addended by: Shary Decamp B on: 11/30/2018 04:28 PM   Modules accepted: Orders

## 2018-11-30 NOTE — Progress Notes (Signed)
Subjective:    Patient ID: Adam Lozano, male    DOB: [birth year], 48 y.o.   MRN: [id number]  HPI Patient is a 48 year old Caucasian male who is on disability due to chronic low back pain as well as severe atopic dermatitis.  Due to his atopic dermatitis, he is on methotrexate.  His liver tests are supposedly monitored by his dermatologist according to the patient.  He has not had the shingles vaccine.  His last Pneumovax 23 was more than 14 years ago.  He did get his flu shot earlier this year.  He also has a history of borderline type 2 diabetes mellitus.  He is due to recheck hemoglobin A1c.  He quit smoking 7 years ago although he does occasionally smoke marijuana.  His blood pressure slightly elevated today at 142/88.  He admits that he is not getting any regular exercise.  He denies any chest pain shortness of breath or dyspnea on exertion.  He is suffering from chronic blepharitis.  He manages his chronic low back pain with approximately 1 hydrocodone every day.  He usually takes this at night to help him sleep.  He also uses tizanidine to help with muscle spasms in his lower back at roughly the level of L4-L5.  The majority of his pain sound like muscle spasms.  They occur with prolonged standing or prolonged sitting.  They are located on either side of his spine roughly the level of L4-L5.  He denies any lumbar radiculopathy Past Medical History:  Diagnosis Date  . Asthma   . Atopic dermatitis   . GERD (gastroesophageal reflux disease)   . Gout   . Low back pain    Chronic  . RLS (restless legs syndrome)    Past Surgical History:  Procedure Laterality Date  . EYE SURGERY  02/24/14/15   cataracts rt eye   Current Outpatient Medications on File Prior to Visit  Medication Sig Dispense Refill  . Artificial Tear Ointment (ARTIFICIAL TEARS) ointment Place 1 application into both eyes daily.     Marland Kitchen BEPREVE 1.5 % SOLN Place 1 drop into both eyes 2 (two) times daily.  11  .  budesonide-formoterol (SYMBICORT) 160-4.5 MCG/ACT inhaler INHALE 2 PUFFS INTO THE LUNGS TWICE A DAY 30.6 Inhaler 1  . carboxymethylcellulose (REFRESH PLUS) 0.5 % SOLN Place 1 drop into both eyes 2 (two) times daily.     . cetirizine (ZYRTEC) 10 MG tablet Take 10 mg by mouth daily.    . clobetasol cream (TEMOVATE) 9.56 % Apply 1 application topically 2 (two) times daily.     . fish oil-omega-3 fatty acids 1000 MG capsule Take 2 g by mouth daily.    . folic acid (FOLVITE) 1 MG tablet Take 1 mg by mouth daily.  8  . HYDROcodone-acetaminophen (NORCO/VICODIN) 5-325 MG tablet Take 1 tablet by mouth every 6 (six) hours as needed for moderate pain. 30 tablet 0  . hydrocortisone valerate cream (WESTCORT) 0.2 % Apply 1 application topically daily.     Marland Kitchen ibuprofen (ADVIL,MOTRIN) 200 MG tablet Take 200-400 mg by mouth every 6 (six) hours as needed for mild pain or moderate pain.    . methotrexate (RHEUMATREX) 2.5 MG tablet Take 15 mg by mouth once a week. Caution:Chemotherapy. Protect from light. Take on sundays Pt takes 6 pills every Sunday to equal 15mg     . montelukast (SINGULAIR) 10 MG tablet TAKE 1 TABLET BY MOUTH EVERY DAY FOR ALLERGIES AND ASTHMA 90 tablet 0  . pimecrolimus (  ELIDEL) 1 % cream Apply topically 2 (two) times daily.    . prednisoLONE acetate (PRED FORTE) 1 % ophthalmic suspension Place 1 drop into both eyes daily.  0  . tiZANidine (ZANAFLEX) 4 MG tablet TAKE 1 TABLET BY MOUTH EVERY 6 HOURS AS NEEDED FOR MUSCLE SPASM 60 tablet 2   No current facility-administered medications on file prior to visit.    Allergies  Allergen Reactions  . Coconut Oil Hives and Itching    MAKES THROAT ITCH, ALSO  . Other Hives and Itching    NO TREE NUTS  . Peanut-Containing Drug Products Hives and Itching    MAKES THROAT ITCH, ALSO  . Latex Rash   Social History   Socioeconomic History  . Marital status: Single    Spouse name: Not on file  . Number of children: Not on file  . Years of education:  Not on file  . Highest education level: Not on file  Occupational History  . Not on file  Social Needs  . Financial resource strain: Not on file  . Food insecurity:    Worry: Not on file    Inability: Not on file  . Transportation needs:    Medical: Not on file    Non-medical: Not on file  Tobacco Use  . Smoking status: Former Smoker    Last attempt to quit: 11/25/2011    Years since quitting: 7.0  . Smokeless tobacco: Never Used  Substance and Sexual Activity  . Alcohol use: No  . Drug use: Not on file  . Sexual activity: Not on file  Lifestyle  . Physical activity:    Days per week: Not on file    Minutes per session: Not on file  . Stress: Not on file  Relationships  . Social connections:    Talks on phone: Not on file    Gets together: Not on file    Attends religious service: Not on file    Active member of club or organization: Not on file    Attends meetings of clubs or organizations: Not on file    Relationship status: Not on file  . Intimate partner violence:    Fear of current or ex partner: Not on file    Emotionally abused: Not on file    Physically abused: Not on file    Forced sexual activity: Not on file  Other Topics Concern  . Not on file  Social History Narrative  . Not on file      Review of Systems  All other systems reviewed and are negative.      Objective:   Physical Exam Vitals signs reviewed.  Constitutional:      General: He is not in acute distress.    Appearance: Normal appearance. He is obese. He is not ill-appearing.  Cardiovascular:     Rate and Rhythm: Normal rate and regular rhythm.     Pulses: Normal pulses.     Heart sounds: Normal heart sounds. No murmur. No friction rub. No gallop.   Pulmonary:     Effort: Pulmonary effort is normal. No respiratory distress.     Breath sounds: Normal breath sounds. No stridor. No wheezing, rhonchi or rales.  Chest:     Chest wall: No tenderness.  Abdominal:     General: Bowel  sounds are normal.     Palpations: Abdomen is soft.     Tenderness: There is no abdominal tenderness. There is no guarding or rebound.  Musculoskeletal:  Right lower leg: No edema.     Left lower leg: No edema.  Skin:    Findings: Rash present.  Neurological:     Mental Status: He is alert.           Assessment & Plan:  Mild intermittent asthma without complication  Hyperglycemia - Plan: Hemoglobin A1c, CBC with Differential/Platelet, COMPLETE METABOLIC PANEL WITH GFR, Lipid panel  Chronic bilateral low back pain without sciatica  Atopic dermatitis, unspecified type  Patient's blood pressure today is borderline.  I recommended regular aerobic exercise to help decrease his blood pressure hopefully 4 5 points.  I would also recommend sodium restriction and weight loss.  Due to his chronic immunosuppressive state due to methotrexate, I recommended the patient receive a booster on Pneumovax 23.  His flu shot is up-to-date.  Also recommended that the patient inquire as to the shingles vaccine shin Grix at his local pharmacy.  He is not due for colonoscopy or prostate cancer screening until age 82.  Given his history of prediabetes, I recommended the patient return fasting for hemoglobin A1c, CBC, CMP and a fasting lipid panel.  I will continue to prescribe the patient's tizanidine for chronic low back pain which primarily sounds to be muscle spasms secondary to mild degenerative disc disease.  As long as he is using 1 hydrocodone at night, I did not have a problem prescribing this.  Regular anticipatory guidance is provided

## 2018-12-14 DIAGNOSIS — L2089 Other atopic dermatitis: Secondary | ICD-10-CM | POA: Diagnosis not present

## 2018-12-14 DIAGNOSIS — Z79899 Other long term (current) drug therapy: Secondary | ICD-10-CM | POA: Diagnosis not present

## 2019-01-04 ENCOUNTER — Other Ambulatory Visit: Payer: Self-pay | Admitting: Family Medicine

## 2019-01-04 DIAGNOSIS — M545 Low back pain, unspecified: Secondary | ICD-10-CM

## 2019-01-04 MED ORDER — HYDROCODONE-ACETAMINOPHEN 5-325 MG PO TABS: 1.0000 | ORAL_TABLET | Freq: Four times a day (QID) | ORAL | 0 refills | Status: DC | PRN

## 2019-01-04 NOTE — Telephone Encounter (Signed)
Patient is requesting a refill on Hydrocodone   LOV: 11/30/18  LRF: 11/13/18

## 2019-01-04 NOTE — Telephone Encounter (Signed)
Med refill on hydrocodone to [facility] [address]  °

## 2019-01-13 ENCOUNTER — Other Ambulatory Visit: Payer: Self-pay | Admitting: Family Medicine

## 2019-01-13 DIAGNOSIS — M545 Low back pain, unspecified: Secondary | ICD-10-CM

## 2019-01-14 NOTE — Telephone Encounter (Signed)
Ok to refill 

## 2019-02-05 ENCOUNTER — Other Ambulatory Visit: Payer: Self-pay | Admitting: *Deleted

## 2019-02-05 MED ORDER — MONTELUKAST SODIUM 10 MG PO TABS: 10.0000 mg | ORAL_TABLET | Freq: Every day | ORAL | 3 refills | Status: DC

## 2019-03-01 ENCOUNTER — Ambulatory Visit (INDEPENDENT_AMBULATORY_CARE_PROVIDER_SITE_OTHER): Payer: Medicare Other | Admitting: Family Medicine

## 2019-03-01 ENCOUNTER — Ambulatory Visit: Payer: Medicare Other | Admitting: Family Medicine

## 2019-03-01 ENCOUNTER — Other Ambulatory Visit: Payer: Self-pay

## 2019-03-01 VITALS — BP 138/90 | HR 80 | Temp 97.8°F | Resp 18 | Ht 65.0 in | Wt 193.0 lb

## 2019-03-01 DIAGNOSIS — R799 Abnormal finding of blood chemistry, unspecified: Secondary | ICD-10-CM | POA: Diagnosis not present

## 2019-03-01 DIAGNOSIS — Z13220 Encounter for screening for lipoid disorders: Secondary | ICD-10-CM | POA: Diagnosis not present

## 2019-03-01 DIAGNOSIS — R030 Elevated blood-pressure reading, without diagnosis of hypertension: Secondary | ICD-10-CM | POA: Diagnosis not present

## 2019-03-01 DIAGNOSIS — R7303 Prediabetes: Secondary | ICD-10-CM | POA: Diagnosis not present

## 2019-03-01 NOTE — Progress Notes (Signed)
Subjective:    Patient ID: Adam Lozano, male    DOB: 1970-11-19, 48 y.o.   MRN: [id number]  HPI  2/20 Patient is a 48 year old Caucasian male who is on disability due to chronic low back pain as well as severe atopic dermatitis.  Due to his atopic dermatitis, he is on methotrexate.  His liver tests are supposedly monitored by his dermatologist according to the patient.  He has not had the shingles vaccine.  His last Pneumovax 23 was more than 14 years ago.  He did get his flu shot earlier this year.  He also has a history of borderline type 2 diabetes mellitus.  He is due to recheck hemoglobin A1c.  He quit smoking 7 years ago although he does occasionally smoke marijuana.  His blood pressure slightly elevated today at 142/88.  He admits that he is not getting any regular exercise.  He denies any chest pain shortness of breath or dyspnea on exertion.  He is suffering from chronic blepharitis.  He manages his chronic low back pain with approximately 1 hydrocodone every day.  He usually takes this at night to help him sleep.  He also uses tizanidine to help with muscle spasms in his lower back at roughly the level of L4-L5.  The majority of his pain sound like muscle spasms.  They occur with prolonged standing or prolonged sitting.  They are located on either side of his spine roughly the level of L4-L5.  He denies any lumbar radiculopathy.  At that time, my plan was: Patient's blood pressure today is borderline.  I recommended regular aerobic exercise to help decrease his blood pressure hopefully 4 5 points.  I would also recommend sodium restriction and weight loss.  Due to his chronic immunosuppressive state due to methotrexate, I recommended the patient receive a booster on Pneumovax 23.  His flu shot is up-to-date.  Also recommended that the patient inquire as to the shingles vaccine shin Grix at his local pharmacy.  He is not due for colonoscopy or prostate cancer screening until age 65.  Given his  history of prediabetes, I recommended the patient return fasting for hemoglobin A1c, CBC, CMP and a fasting lipid panel.  I will continue to prescribe the patient's tizanidine for chronic low back pain which primarily sounds to be muscle spasms secondary to mild degenerative disc disease.  As long as he is using 1 hydrocodone at night, I did not have a problem prescribing this.  Regular anticipatory guidance is provided  03/01/19 Patient is here today for follow-up.  His blood pressure remains borderline at 138/90.  However he states that he is checking his blood pressure at home and that it is running higher at home although he cannot remember exactly how high his blood pressures running.  He denies any chest pain shortness of breath or dyspnea on exertion.  He does have a history of prediabetes with hemoglobin A1c is greater than 6.  He denies any polyuria, polydipsia, or blurry vision.  His immune system is compromised as he is on methotrexate.  However he denies any fevers chills shortness of breath or cough.  He is trying to self quarantine to avoid acquiring COVID-19.  He continues to deal with constant low back pain.  He mainly uses hydrocodone at night to help him sleep.  He is also using Zanaflex as needed for muscle spasms in his lower back.  He continues to deny any symptoms of radiculopathy.  He denies any symptoms  of cauda equina syndrome. Past Medical History:  Diagnosis Date   Asthma    Atopic dermatitis    GERD (gastroesophageal reflux disease)    Gout    Low back pain    Chronic   RLS (restless legs syndrome)    Past Surgical History:  Procedure Laterality Date   EYE SURGERY  02/24/14/15   cataracts rt eye   Current Outpatient Medications on File Prior to Visit  Medication Sig Dispense Refill   Artificial Tear Ointment (ARTIFICIAL TEARS) ointment Place 1 application into both eyes daily.      BEPREVE 1.5 % SOLN Place 1 drop into both eyes 2 (two) times daily.  11    budesonide-formoterol (SYMBICORT) 160-4.5 MCG/ACT inhaler INHALE 2 PUFFS INTO THE LUNGS TWICE A DAY 30.6 Inhaler 1   carboxymethylcellulose (REFRESH PLUS) 0.5 % SOLN Place 1 drop into both eyes 2 (two) times daily.      cetirizine (ZYRTEC) 10 MG tablet Take 10 mg by mouth daily.     clobetasol cream (TEMOVATE) 3.79 % Apply 1 application topically 2 (two) times daily.      fish oil-omega-3 fatty acids 1000 MG capsule Take 2 g by mouth daily.     folic acid (FOLVITE) 1 MG tablet Take 1 mg by mouth daily.  8   HYDROcodone-acetaminophen (NORCO/VICODIN) 5-325 MG tablet Take 1 tablet by mouth every 6 (six) hours as needed for moderate pain. 30 tablet 0   hydrocortisone valerate cream (WESTCORT) 0.2 % Apply 1 application topically daily.      ibuprofen (ADVIL,MOTRIN) 200 MG tablet Take 200-400 mg by mouth every 6 (six) hours as needed for mild pain or moderate pain.     methotrexate (RHEUMATREX) 2.5 MG tablet Take 15 mg by mouth once a week. Caution:Chemotherapy. Protect from light. Take on sundays Pt takes 6 pills every Sunday to equal 15mg      montelukast (SINGULAIR) 10 MG tablet Take 1 tablet (10 mg total) by mouth at bedtime. 90 tablet 3   pimecrolimus (ELIDEL) 1 % cream Apply topically 2 (two) times daily.     prednisoLONE acetate (PRED FORTE) 1 % ophthalmic suspension Place 1 drop into both eyes daily.  0   tiZANidine (ZANAFLEX) 4 MG tablet TAKE 1 TABLET BY MOUTH EVERY 6 HOURS AS NEEDED FOR MUSCLE SPASM 60 tablet 2   No current facility-administered medications on file prior to visit.    Allergies  Allergen Reactions   Coconut Oil Hives and Itching    MAKES THROAT ITCH, ALSO   Other Hives and Itching    NO TREE NUTS   Peanut-Containing Drug Products Hives and Itching    MAKES THROAT ITCH, ALSO   Latex Rash   Social History   Socioeconomic History   Marital status: Single    Spouse name: Not on file   Number of children: Not on file   Years of education: Not on  file   Highest education level: Not on file  Occupational History   Not on file  Social Needs   Financial resource strain: Not on file   Food insecurity:    Worry: Not on file    Inability: Not on file   Transportation needs:    Medical: Not on file    Non-medical: Not on file  Tobacco Use   Smoking status: Former Smoker    Last attempt to quit: 11/25/2011    Years since quitting: 7.2   Smokeless tobacco: Never Used  Substance and Sexual Activity  Alcohol use: No   Drug use: Not on file   Sexual activity: Not on file  Lifestyle   Physical activity:    Days per week: Not on file    Minutes per session: Not on file   Stress: Not on file  Relationships   Social connections:    Talks on phone: Not on file    Gets together: Not on file    Attends religious service: Not on file    Active member of club or organization: Not on file    Attends meetings of clubs or organizations: Not on file    Relationship status: Not on file   Intimate partner violence:    Fear of current or ex partner: Not on file    Emotionally abused: Not on file    Physically abused: Not on file    Forced sexual activity: Not on file  Other Topics Concern   Not on file  Social History Narrative   Not on file      Review of Systems  Musculoskeletal: Positive for back pain.  All other systems reviewed and are negative.      Objective:   Physical Exam Vitals signs reviewed.  Constitutional:      General: He is not in acute distress.    Appearance: Normal appearance. He is obese. He is not ill-appearing.  Cardiovascular:     Rate and Rhythm: Normal rate and regular rhythm.     Pulses: Normal pulses.     Heart sounds: Normal heart sounds. No murmur. No friction rub. No gallop.   Pulmonary:     Effort: Pulmonary effort is normal. No respiratory distress.     Breath sounds: Normal breath sounds. No stridor. No wheezing, rhonchi or rales.  Chest:     Chest wall: No tenderness.    Abdominal:     General: Bowel sounds are normal.     Palpations: Abdomen is soft.     Tenderness: There is no abdominal tenderness. There is no guarding or rebound.  Musculoskeletal:     Right lower leg: No edema.     Left lower leg: No edema.  Skin:    Findings: Rash present.  Neurological:     Mental Status: He is alert.           Assessment & Plan:  Prediabetes - Plan: CBC with Differential/Platelet, COMPLETE METABOLIC PANEL WITH GFR, Lipid panel, Hemoglobin A1c  Prehypertension  I believe his blood pressure could be reaching the point we may need to institute medication.  Of asked the patient check his blood pressure morning and night for the next week and record those values and bring them to me so that I can review them.  If greater than 140/90, I would recommend starting hydrochlorothiazide to reduce his blood pressure and reduce his long-term risk of cardiovascular disease.  Patient is a high risk of a 9 patient due to his compromised immune system due to methotrexate as well as his underlying history of asthma.  Therefore I recommended that he self quarantine at home at the present time despite the lifting of restrictions on television.  Continue hydrocodone as needed for back pain.  Monitor his prediabetes by obtaining a CMP, fasting lipid panel, and hemoglobin A1c today.

## 2019-03-02 LAB — CBC WITH DIFFERENTIAL/PLATELET
Absolute Monocytes: 905 cells/uL (ref 200–950)
Basophils Absolute: 42 cells/uL (ref 0–200)
Basophils Relative: 0.5 %
Eosinophils Absolute: 365 cells/uL (ref 15–500)
Eosinophils Relative: 4.4 %
HCT: 41.7 % (ref 38.5–50.0)
Hemoglobin: 14.3 g/dL (ref 13.2–17.1)
Lymphs Abs: 1975 cells/uL (ref 850–3900)
MCH: 30.4 pg (ref 27.0–33.0)
MCHC: 34.3 g/dL (ref 32.0–36.0)
MCV: 88.7 fL (ref 80.0–100.0)
MPV: 10.3 fL (ref 7.5–12.5)
Monocytes Relative: 10.9 %
Neutro Abs: 5013 cells/uL (ref 1500–7800)
Neutrophils Relative %: 60.4 %
Platelets: 227 10*3/uL (ref 140–400)
RBC: 4.7 10*6/uL (ref 4.20–5.80)
RDW: 13.6 % (ref 11.0–15.0)
Total Lymphocyte: 23.8 %
WBC: 8.3 10*3/uL (ref 3.8–10.8)

## 2019-03-02 LAB — HEMOGLOBIN A1C
Hgb A1c MFr Bld: 5.6 % of total Hgb (ref <5.7)
Mean Plasma Glucose: 114 (calc)
eAG (mmol/L): 6.3 (calc)

## 2019-03-02 LAB — COMPLETE METABOLIC PANEL WITH GFR
AG Ratio: 2 (calc) (ref 1.0–2.5)
ALT: 19 U/L (ref 9–46)
AST: 17 U/L (ref 10–40)
Albumin: 4.7 g/dL (ref 3.6–5.1)
Alkaline phosphatase (APISO): 76 U/L (ref 36–130)
BUN: 10 mg/dL (ref 7–25)
CO2: 22 mmol/L (ref 20–32)
Calcium: 9.5 mg/dL (ref 8.6–10.3)
Chloride: 107 mmol/L (ref 98–110)
Creat: 0.76 mg/dL (ref 0.60–1.35)
GFR, Est African American: 127 mL/min/{1.73_m2} (ref >=60)
GFR, Est Non African American: 109 mL/min/{1.73_m2} (ref >=60)
Globulin: 2.4 g/dL (calc) (ref 1.9–3.7)
Glucose, Bld: 86 mg/dL (ref 65–99)
Potassium: 4.4 mmol/L (ref 3.5–5.3)
Sodium: 140 mmol/L (ref 135–146)
Total Bilirubin: 0.3 mg/dL (ref 0.2–1.2)
Total Protein: 7.1 g/dL (ref 6.1–8.1)

## 2019-03-02 LAB — LIPID PANEL
Cholesterol: 183 mg/dL (ref <200)
HDL: 53 mg/dL (ref >=40)
LDL Cholesterol (Calc): 107 mg/dL (calc) — ABNORMAL HIGH
Non-HDL Cholesterol (Calc): 130 mg/dL (calc) — ABNORMAL HIGH (ref <130)
Total CHOL/HDL Ratio: 3.5 (calc) (ref <5.0)
Triglycerides: 119 mg/dL (ref <150)

## 2019-03-11 DIAGNOSIS — L2089 Other atopic dermatitis: Secondary | ICD-10-CM | POA: Diagnosis not present

## 2019-03-11 DIAGNOSIS — Z79899 Other long term (current) drug therapy: Secondary | ICD-10-CM | POA: Diagnosis not present

## 2019-03-11 DIAGNOSIS — G918 Other hydrocephalus: Secondary | ICD-10-CM | POA: Diagnosis not present

## 2019-03-12 ENCOUNTER — Other Ambulatory Visit: Payer: Self-pay | Admitting: Family Medicine

## 2019-03-12 DIAGNOSIS — M545 Low back pain, unspecified: Secondary | ICD-10-CM

## 2019-03-12 MED ORDER — HYDROCODONE-ACETAMINOPHEN 5-325 MG PO TABS: 1.0000 | ORAL_TABLET | Freq: Four times a day (QID) | ORAL | 0 refills | Status: DC | PRN

## 2019-03-12 NOTE — Telephone Encounter (Signed)
Patient is requesting a refill on Hydrocodone   LOV: 03/01/19  LRF:   01/04/19

## 2019-04-17 DIAGNOSIS — H179 Unspecified corneal scar and opacity: Secondary | ICD-10-CM | POA: Diagnosis not present

## 2019-04-17 DIAGNOSIS — Z961 Presence of intraocular lens: Secondary | ICD-10-CM | POA: Diagnosis not present

## 2019-04-17 DIAGNOSIS — H2512 Age-related nuclear cataract, left eye: Secondary | ICD-10-CM | POA: Diagnosis not present

## 2019-04-17 DIAGNOSIS — H04123 Dry eye syndrome of bilateral lacrimal glands: Secondary | ICD-10-CM | POA: Diagnosis not present

## 2019-04-26 ENCOUNTER — Other Ambulatory Visit: Payer: Self-pay | Admitting: Family Medicine

## 2019-04-26 DIAGNOSIS — M545 Low back pain, unspecified: Secondary | ICD-10-CM

## 2019-04-29 NOTE — Telephone Encounter (Signed)
Ok to refill 

## 2019-05-14 ENCOUNTER — Other Ambulatory Visit: Payer: Self-pay | Admitting: Family Medicine

## 2019-05-14 DIAGNOSIS — M545 Low back pain, unspecified: Secondary | ICD-10-CM

## 2019-05-14 MED ORDER — HYDROCODONE-ACETAMINOPHEN 5-325 MG PO TABS: 1.0000 | ORAL_TABLET | Freq: Four times a day (QID) | ORAL | 0 refills | Status: DC | PRN

## 2019-05-14 NOTE — Telephone Encounter (Signed)
Patient is requesting a refill on Hydrocodone   LOV: 03/01/19  LRF:  03/12/19

## 2019-06-03 ENCOUNTER — Encounter: Payer: Self-pay | Admitting: Family Medicine

## 2019-06-03 ENCOUNTER — Ambulatory Visit (INDEPENDENT_AMBULATORY_CARE_PROVIDER_SITE_OTHER): Payer: Medicare Other | Admitting: Family Medicine

## 2019-06-03 ENCOUNTER — Other Ambulatory Visit: Payer: Self-pay

## 2019-06-03 VITALS — BP 142/90 | HR 84 | Temp 98.3°F | Resp 16 | Ht 66.0 in | Wt 190.0 lb

## 2019-06-03 DIAGNOSIS — G8929 Other chronic pain: Secondary | ICD-10-CM

## 2019-06-03 DIAGNOSIS — D559 Anemia due to enzyme disorder, unspecified: Secondary | ICD-10-CM | POA: Diagnosis not present

## 2019-06-03 DIAGNOSIS — R030 Elevated blood-pressure reading, without diagnosis of hypertension: Secondary | ICD-10-CM | POA: Diagnosis not present

## 2019-06-03 DIAGNOSIS — Z79899 Other long term (current) drug therapy: Secondary | ICD-10-CM | POA: Diagnosis not present

## 2019-06-03 DIAGNOSIS — R7303 Prediabetes: Secondary | ICD-10-CM

## 2019-06-03 DIAGNOSIS — M545 Low back pain, unspecified: Secondary | ICD-10-CM

## 2019-06-03 NOTE — Progress Notes (Signed)
Subjective:    Patient ID: Adam Lozano, male    DOB: 11-26-1970, 48 y.o.   MRN: [id number]  Hypertension  Back Pain  Medication Refill    2/20 Patient is a 48 year old Caucasian male who is on disability due to chronic low back pain as well as severe atopic dermatitis.  Due to his atopic dermatitis, he is on methotrexate.  His liver tests are supposedly monitored by his dermatologist according to the patient.  He has not had the shingles vaccine.  His last Pneumovax 23 was more than 14 years ago.  He did get his flu shot earlier this year.  He also has a history of borderline type 2 diabetes mellitus.  He is due to recheck hemoglobin A1c.  He quit smoking 7 years ago although he does occasionally smoke marijuana.  His blood pressure slightly elevated today at 142/88.  He admits that he is not getting any regular exercise.  He denies any chest pain shortness of breath or dyspnea on exertion.  He is suffering from chronic blepharitis.  He manages his chronic low back pain with approximately 1 hydrocodone every day.  He usually takes this at night to help him sleep.  He also uses tizanidine to help with muscle spasms in his lower back at roughly the level of L4-L5.  The majority of his pain sound like muscle spasms.  They occur with prolonged standing or prolonged sitting.  They are located on either side of his spine roughly the level of L4-L5.  He denies any lumbar radiculopathy.  At that time, my plan was: Patient's blood pressure today is borderline.  I recommended regular aerobic exercise to help decrease his blood pressure hopefully 4 5 points.  I would also recommend sodium restriction and weight loss.  Due to his chronic immunosuppressive state due to methotrexate, I recommended the patient receive a booster on Pneumovax 23.  His flu shot is up-to-date.  Also recommended that the patient inquire as to the shingles vaccine shin Grix at his local pharmacy.  He is not due for colonoscopy or  prostate cancer screening until age 71.  Given his history of prediabetes, I recommended the patient return fasting for hemoglobin A1c, CBC, CMP and a fasting lipid panel.  I will continue to prescribe the patient's tizanidine for chronic low back pain which primarily sounds to be muscle spasms secondary to mild degenerative disc disease.  As long as he is using 1 hydrocodone at night, I did not have a problem prescribing this.  Regular anticipatory guidance is provided  03/01/19 Patient is here today for follow-up.  His blood pressure remains borderline at 138/90.  However he states that he is checking his blood pressure at home and that it is running higher at home although he cannot remember exactly how high his blood pressures running.  He denies any chest pain shortness of breath or dyspnea on exertion.  He does have a history of prediabetes with hemoglobin A1c is greater than 6.  He denies any polyuria, polydipsia, or blurry vision.  His immune system is compromised as he is on methotrexate.  However he denies any fevers chills shortness of breath or cough.  He is trying to self quarantine to avoid acquiring COVID-19.  He continues to deal with constant low back pain.  He mainly uses hydrocodone at night to help him sleep.  He is also using Zanaflex as needed for muscle spasms in his lower back.  He continues to deny any  symptoms of radiculopathy.  He denies any symptoms of cauda equina syndrome.  At that time, my plan was: I believe his blood pressure could be reaching the point we may need to institute medication.  I asked the patient check his blood pressure morning and night for the next week and record those values and bring them to me so that I can review them.  If greater than 140/90, I would recommend starting hydrochlorothiazide to reduce his blood pressure and reduce his long-term risk of cardiovascular disease.  Patient is a high risk of a 57 patient due to his compromised immune system due to  methotrexate as well as his underlying history of asthma.  Therefore I recommended that he self quarantine at home at the present time despite the lifting of restrictions on television.  Continue hydrocodone as needed for back pain.  Monitor his prediabetes by obtaining a CMP, fasting lipid panel, and hemoglobin A1c today.  06/03/19 LDL was 107 in May and HgA1c was 5.6.  These were very good and I was very pleased with him.  The patient has been checking his blood pressure frequently at home.  The vast majority of systolic blood pressures are between 110 and 130/60-80.  However I did say 30% of the time, the patient's systolic blood pressure is higher than 140.  Occasionally he will have a diastolic blood pressure higher than 90.  We discussed starting medication such as a low-dose amlodipine for high blood pressure.  Patient is hesitant to start any medication and prefers to continue to monitor it.  He states that his blood pressure was high due to exertion.  He has not been waiting after he physically exerts himself before he checks his blood pressure.  He believes that this is the reason some of his blood pressures were so high.  He denies any chest pain or shortness of breath or dyspnea on exertion.  He has no carotid bruit today on exam and there is no pitting edema.  He denies any orthopnea or paroxysmal nocturnal dyspnea.  He is due for a flu shot which I recommended but he politely declined.  He is not due for prostate cancer screening or colon cancer screening until age 61.  He is requesting that we check his vitamins and minerals.  I explained to the patient that his insurance would likely not cover that for no symptoms.  He is on methotrexate so I believe it is reasonable to check a vitamin B12 level.  He is also been on prednisone in the past putting him at high risk for osteoporosis and therefore I believe it is reasonable to check for vitamin D deficiency which would contribute to osteoporosis.   Unless there is an abnormality on his CBC I would not check for iron or folate.  I will check a CMP. Past Medical History:  Diagnosis Date  . Asthma   . Atopic dermatitis   . GERD (gastroesophageal reflux disease)   . Gout   . Low back pain    Chronic  . RLS (restless legs syndrome)    Past Surgical History:  Procedure Laterality Date  . EYE SURGERY  02/24/14/15   cataracts rt eye   Current Outpatient Medications on File Prior to Visit  Medication Sig Dispense Refill  . Artificial Tear Ointment (ARTIFICIAL TEARS) ointment Place 1 application into both eyes daily.     Marland Kitchen BEPREVE 1.5 % SOLN Place 1 drop into both eyes 2 (two) times daily.  11  .  budesonide-formoterol (SYMBICORT) 160-4.5 MCG/ACT inhaler INHALE 2 PUFFS INTO THE LUNGS TWICE A DAY 30.6 Inhaler 1  . carboxymethylcellulose (REFRESH PLUS) 0.5 % SOLN Place 1 drop into both eyes 2 (two) times daily.     . cetirizine (ZYRTEC) 10 MG tablet Take 10 mg by mouth daily.    . clobetasol cream (TEMOVATE) 9.38 % Apply 1 application topically 2 (two) times daily.     . fish oil-omega-3 fatty acids 1000 MG capsule Take 2 g by mouth daily.    . folic acid (FOLVITE) 1 MG tablet Take 1 mg by mouth daily.  8  . HYDROcodone-acetaminophen (NORCO/VICODIN) 5-325 MG tablet Take 1 tablet by mouth every 6 (six) hours as needed for moderate pain. 30 tablet 0  . hydrocortisone valerate cream (WESTCORT) 0.2 % Apply 1 application topically daily.     Marland Kitchen ibuprofen (ADVIL,MOTRIN) 200 MG tablet Take 200-400 mg by mouth every 6 (six) hours as needed for mild pain or moderate pain.    . methotrexate (RHEUMATREX) 2.5 MG tablet Take 10 mg by mouth once a week. Caution:Chemotherapy. Protect from light. Take on sundays Pt takes 4 pills every Sunday to equal 10 mg    . montelukast (SINGULAIR) 10 MG tablet Take 1 tablet (10 mg total) by mouth at bedtime. 90 tablet 3  . pimecrolimus (ELIDEL) 1 % cream Apply topically 2 (two) times daily.    . prednisoLONE acetate (PRED  FORTE) 1 % ophthalmic suspension Place 1 drop into both eyes daily.  0  . tiZANidine (ZANAFLEX) 4 MG tablet TAKE 1 TABLET BY MOUTH EVERY 6 HOURS AS NEEDED FOR MUSCLE SPASM 60 tablet 2   No current facility-administered medications on file prior to visit.    Allergies  Allergen Reactions  . Coconut Oil Hives and Itching    MAKES THROAT ITCH, ALSO  . Other Hives and Itching    NO TREE NUTS  . Peanut-Containing Drug Products Hives and Itching    MAKES THROAT ITCH, ALSO  . Latex Rash   Social History   Socioeconomic History  . Marital status: Single    Spouse name: Not on file  . Number of children: Not on file  . Years of education: Not on file  . Highest education level: Not on file  Occupational History  . Not on file  Social Needs  . Financial resource strain: Not on file  . Food insecurity    Worry: Not on file    Inability: Not on file  . Transportation needs    Medical: Not on file    Non-medical: Not on file  Tobacco Use  . Smoking status: Former Smoker    Quit date: 11/25/2011    Years since quitting: 7.5  . Smokeless tobacco: Never Used  Substance and Sexual Activity  . Alcohol use: No  . Drug use: Not on file  . Sexual activity: Not on file  Lifestyle  . Physical activity    Days per week: Not on file    Minutes per session: Not on file  . Stress: Not on file  Relationships  . Social Herbalist on phone: Not on file    Gets together: Not on file    Attends religious service: Not on file    Active member of club or organization: Not on file    Attends meetings of clubs or organizations: Not on file    Relationship status: Not on file  . Intimate partner violence    Fear  of current or ex partner: Not on file    Emotionally abused: Not on file    Physically abused: Not on file    Forced sexual activity: Not on file  Other Topics Concern  . Not on file  Social History Narrative  . Not on file      Review of Systems  Musculoskeletal:  Positive for back pain.  All other systems reviewed and are negative.      Objective:   Physical Exam Vitals signs reviewed.  Constitutional:      General: He is not in acute distress.    Appearance: Normal appearance. He is obese. He is not ill-appearing.  Cardiovascular:     Rate and Rhythm: Normal rate and regular rhythm.     Pulses: Normal pulses.     Heart sounds: Normal heart sounds. No murmur. No friction rub. No gallop.   Pulmonary:     Effort: Pulmonary effort is normal. No respiratory distress.     Breath sounds: Normal breath sounds. No stridor. No wheezing, rhonchi or rales.  Chest:     Chest wall: No tenderness.  Abdominal:     General: Bowel sounds are normal.     Palpations: Abdomen is soft.     Tenderness: There is no abdominal tenderness. There is no guarding or rebound.  Musculoskeletal:     Right lower leg: No edema.     Left lower leg: No edema.  Skin:    Findings: Rash present.  Neurological:     Mental Status: He is alert.           Assessment & Plan:  1. Prehypertension Patient has borderline hypertension.  After a long discussion today, the patient declines medication for his blood pressure.  He will continue to monitor and if his systolic blood pressures greater than 124 or his diastolic blood pressures greater than 90 on a consistent basis he will call me. - CBC with Differential/Platelet - COMPLETE METABOLIC PANEL WITH GFR  2. Prediabetes Last hemoglobin A1c was 5.6.  I believe we can space this out to checking every 6 months rather than every 3 months.  3. Chronic bilateral low back pain without sciatica Patient continues to show no evidence of abuse or diversion.  Therefore I will continue to prescribe his hydrocodone as currently prescribed.  4. High risk medication use Due to his use of methotrexate, I will check for vitamin B12 deficiency.  Given his previous history of prednisone use, I would recommend checking for vitamin D  deficiency as this could contribute to osteoporosis. - Vitamin B12 - VITAMIN D 25 Hydroxy (Vit-D Deficiency, Fractures)

## 2019-06-04 LAB — COMPLETE METABOLIC PANEL WITH GFR
AG Ratio: 2 (calc) (ref 1.0–2.5)
ALT: 20 U/L (ref 9–46)
AST: 17 U/L (ref 10–40)
Albumin: 4.7 g/dL (ref 3.6–5.1)
Alkaline phosphatase (APISO): 76 U/L (ref 36–130)
BUN: 10 mg/dL (ref 7–25)
CO2: 25 mmol/L (ref 20–32)
Calcium: 10 mg/dL (ref 8.6–10.3)
Chloride: 105 mmol/L (ref 98–110)
Creat: 0.84 mg/dL (ref 0.60–1.35)
GFR, Est African American: 122 mL/min/{1.73_m2} (ref >=60)
GFR, Est Non African American: 105 mL/min/{1.73_m2} (ref >=60)
Globulin: 2.3 g/dL (calc) (ref 1.9–3.7)
Glucose, Bld: 85 mg/dL (ref 65–99)
Potassium: 3.8 mmol/L (ref 3.5–5.3)
Sodium: 139 mmol/L (ref 135–146)
Total Bilirubin: 0.3 mg/dL (ref 0.2–1.2)
Total Protein: 7 g/dL (ref 6.1–8.1)

## 2019-06-04 LAB — CBC WITH DIFFERENTIAL/PLATELET
Absolute Monocytes: 775 cells/uL (ref 200–950)
Basophils Absolute: 38 cells/uL (ref 0–200)
Basophils Relative: 0.5 %
Eosinophils Absolute: 296 cells/uL (ref 15–500)
Eosinophils Relative: 3.9 %
HCT: 43.4 % (ref 38.5–50.0)
Hemoglobin: 14.7 g/dL (ref 13.2–17.1)
Lymphs Abs: 2090 cells/uL (ref 850–3900)
MCH: 30.7 pg (ref 27.0–33.0)
MCHC: 33.9 g/dL (ref 32.0–36.0)
MCV: 90.6 fL (ref 80.0–100.0)
MPV: 9.8 fL (ref 7.5–12.5)
Monocytes Relative: 10.2 %
Neutro Abs: 4400 cells/uL (ref 1500–7800)
Neutrophils Relative %: 57.9 %
Platelets: 274 10*3/uL (ref 140–400)
RBC: 4.79 10*6/uL (ref 4.20–5.80)
RDW: 13.5 % (ref 11.0–15.0)
Total Lymphocyte: 27.5 %
WBC: 7.6 10*3/uL (ref 3.8–10.8)

## 2019-06-04 LAB — VITAMIN D 25 HYDROXY (VIT D DEFICIENCY, FRACTURES): Vit D, 25-Hydroxy: 35 ng/mL (ref 30–100)

## 2019-06-04 LAB — VITAMIN B12: Vitamin B-12: 461 pg/mL (ref 200–1100)

## 2019-07-02 ENCOUNTER — Other Ambulatory Visit: Payer: Self-pay | Admitting: Family Medicine

## 2019-07-17 ENCOUNTER — Other Ambulatory Visit: Payer: Self-pay | Admitting: Family Medicine

## 2019-07-17 DIAGNOSIS — M545 Low back pain, unspecified: Secondary | ICD-10-CM

## 2019-07-17 NOTE — Telephone Encounter (Signed)
Patient is requesting a refill on Hydrocodone   LOV: 06/03/19  LRF:  05/14/2019

## 2019-07-18 MED ORDER — HYDROCODONE-ACETAMINOPHEN 5-325 MG PO TABS: 1.0000 | ORAL_TABLET | Freq: Four times a day (QID) | ORAL | 0 refills | Status: DC | PRN

## 2019-08-20 ENCOUNTER — Other Ambulatory Visit: Payer: Self-pay | Admitting: Family Medicine

## 2019-08-20 DIAGNOSIS — M545 Low back pain, unspecified: Secondary | ICD-10-CM

## 2019-09-24 ENCOUNTER — Other Ambulatory Visit: Payer: Self-pay | Admitting: Family Medicine

## 2019-09-24 DIAGNOSIS — M545 Low back pain, unspecified: Secondary | ICD-10-CM

## 2019-09-24 NOTE — Telephone Encounter (Signed)
Patient is requesting a refill on Hydrocodone   LOV: 06/03/19  LRF:   07/18/19

## 2019-09-26 MED ORDER — HYDROCODONE-ACETAMINOPHEN 5-325 MG PO TABS: 1.0000 | ORAL_TABLET | Freq: Four times a day (QID) | ORAL | 0 refills | Status: DC | PRN

## 2019-11-28 ENCOUNTER — Other Ambulatory Visit: Payer: Self-pay | Admitting: Family Medicine

## 2019-11-28 DIAGNOSIS — M545 Low back pain, unspecified: Secondary | ICD-10-CM

## 2019-11-28 MED ORDER — HYDROCODONE-ACETAMINOPHEN 5-325 MG PO TABS: 1.0000 | ORAL_TABLET | Freq: Four times a day (QID) | ORAL | 0 refills | Status: DC | PRN

## 2019-11-28 NOTE — Telephone Encounter (Signed)
Patient is requesting a refill on Hydrocodone   LOV: 06/03/2019  LRF:   09/26/2019

## 2019-11-30 ENCOUNTER — Other Ambulatory Visit: Payer: Self-pay | Admitting: Family Medicine

## 2019-11-30 DIAGNOSIS — M545 Low back pain, unspecified: Secondary | ICD-10-CM

## 2019-12-02 NOTE — Telephone Encounter (Signed)
Ok to refill 

## 2019-12-06 ENCOUNTER — Other Ambulatory Visit: Payer: Self-pay

## 2019-12-06 ENCOUNTER — Ambulatory Visit (HOSPITAL_COMMUNITY)
Admission: RE | Admit: 2019-12-06 | Discharge: 2019-12-06 | Disposition: A | Discharge status: Home / Self Care | Payer: Medicare Other | Source: Ambulatory Visit | Attending: Family Medicine | Admitting: Family Medicine

## 2019-12-06 ENCOUNTER — Encounter: Payer: Self-pay | Admitting: Family Medicine

## 2019-12-06 ENCOUNTER — Ambulatory Visit (INDEPENDENT_AMBULATORY_CARE_PROVIDER_SITE_OTHER): Payer: Medicare Other | Admitting: Family Medicine

## 2019-12-06 VITALS — BP 136/86 | HR 96 | Temp 97.7°F | Resp 14 | Ht 66.0 in | Wt 193.0 lb

## 2019-12-06 DIAGNOSIS — G8929 Other chronic pain: Secondary | ICD-10-CM | POA: Diagnosis not present

## 2019-12-06 DIAGNOSIS — M545 Low back pain, unspecified: Secondary | ICD-10-CM

## 2019-12-06 DIAGNOSIS — R7309 Other abnormal glucose: Secondary | ICD-10-CM | POA: Diagnosis not present

## 2019-12-06 DIAGNOSIS — Z79899 Other long term (current) drug therapy: Secondary | ICD-10-CM | POA: Diagnosis not present

## 2019-12-06 DIAGNOSIS — E781 Pure hyperglyceridemia: Secondary | ICD-10-CM | POA: Diagnosis not present

## 2019-12-06 DIAGNOSIS — R030 Elevated blood-pressure reading, without diagnosis of hypertension: Secondary | ICD-10-CM | POA: Diagnosis not present

## 2019-12-06 DIAGNOSIS — Z13220 Encounter for screening for lipoid disorders: Secondary | ICD-10-CM | POA: Diagnosis not present

## 2019-12-06 DIAGNOSIS — R7303 Prediabetes: Secondary | ICD-10-CM | POA: Diagnosis not present

## 2019-12-06 MED ORDER — MELOXICAM 15 MG PO TABS: 15.0000 mg | ORAL_TABLET | Freq: Every day | ORAL | 0 refills | Status: DC

## 2019-12-06 NOTE — Progress Notes (Signed)
Subjective:    Patient ID: Adam Lozano, male    DOB: 12/06/1970, 49 y.o.   MRN: [id number]  Medication Refill  Hypertension  Back Pain    2/20 Patient is a 49 year old Caucasian male who is on disability due to chronic low back pain as well as severe atopic dermatitis.  Due to his atopic dermatitis, he is on methotrexate.  His liver tests are supposedly monitored by his dermatologist according to the patient.  He has not had the shingles vaccine.  His last Pneumovax 23 was more than 14 years ago.  He did get his flu shot earlier this year.  He also has a history of borderline type 2 diabetes mellitus.  He is due to recheck hemoglobin A1c.  He quit smoking 7 years ago although he does occasionally smoke marijuana.  His blood pressure slightly elevated today at 142/88.  He admits that he is not getting any regular exercise.  He denies any chest pain shortness of breath or dyspnea on exertion.  He is suffering from chronic blepharitis.  He manages his chronic low back pain with approximately 1 hydrocodone every day.  He usually takes this at night to help him sleep.  He also uses tizanidine to help with muscle spasms in his lower back at roughly the level of L4-L5.  The majority of his pain sound like muscle spasms.  They occur with prolonged standing or prolonged sitting.  They are located on either side of his spine roughly the level of L4-L5.  He denies any lumbar radiculopathy.  At that time, my plan was: Patient's blood pressure today is borderline.  I recommended regular aerobic exercise to help decrease his blood pressure hopefully 4 5 points.  I would also recommend sodium restriction and weight loss.  Due to his chronic immunosuppressive state due to methotrexate, I recommended the patient receive a booster on Pneumovax 23.  His flu shot is up-to-date.  Also recommended that the patient inquire as to the shingles vaccine shin Grix at his local pharmacy.  He is not due for colonoscopy or  prostate cancer screening until age 82.  Given his history of prediabetes, I recommended the patient return fasting for hemoglobin A1c, CBC, CMP and a fasting lipid panel.  I will continue to prescribe the patient's tizanidine for chronic low back pain which primarily sounds to be muscle spasms secondary to mild degenerative disc disease.  As long as he is using 1 hydrocodone at night, I did not have a problem prescribing this.  Regular anticipatory guidance is provided  03/01/19 Patient is here today for follow-up.  His blood pressure remains borderline at 138/90.  However he states that he is checking his blood pressure at home and that it is running higher at home although he cannot remember exactly how high his blood pressures running.  He denies any chest pain shortness of breath or dyspnea on exertion.  He does have a history of prediabetes with hemoglobin A1c is greater than 6.  He denies any polyuria, polydipsia, or blurry vision.  His immune system is compromised as he is on methotrexate.  However he denies any fevers chills shortness of breath or cough.  He is trying to self quarantine to avoid acquiring COVID-19.  He continues to deal with constant low back pain.  He mainly uses hydrocodone at night to help him sleep.  He is also using Zanaflex as needed for muscle spasms in his lower back.  He continues to deny any  symptoms of radiculopathy.  He denies any symptoms of cauda equina syndrome.  At that time, my plan was: I believe his blood pressure could be reaching the point we may need to institute medication.  I asked the patient check his blood pressure morning and night for the next week and record those values and bring them to me so that I can review them.  If greater than 140/90, I would recommend starting hydrochlorothiazide to reduce his blood pressure and reduce his long-term risk of cardiovascular disease.  Patient is a high risk of a 49 patient due to his compromised immune system due to  methotrexate as well as his underlying history of asthma.  Therefore I recommended that he self quarantine at home at the present time despite the lifting of restrictions on television.  Patient is a 49 high risk of a 8  Continue hydrocodone as needed for back pain.  Monitor his prediabetes by obtaining a CMP, fasting lipid panel, and hemoglobin A1c today.  06/03/19 LDL was 107 in May and HgA1c was 5.6.  These were very good and I was very pleased with him.  The patient has been checking his blood pressure frequently at home.  The vast majority of systolic blood pressures are between 110 and 130/60-80.  However I did say 30% of the time, the patient's systolic blood pressure is higher than 140.  Occasionally he will have a diastolic blood pressure higher than 90.  We discussed starting medication such as a low-dose amlodipine for high blood pressure.  Patient is hesitant to start any medication and prefers to continue to monitor it.  He states that his blood pressure was high due to exertion.  He has not been waiting after he physically exerts himself before he checks his blood pressure.  He believes that this is the reason some of his blood pressures were so high.  He denies any chest pain or shortness of breath or dyspnea on exertion.  He has no carotid bruit today on exam and there is no pitting edema.  He denies any orthopnea or paroxysmal nocturnal dyspnea.  He is due for a flu shot which I recommended but he politely declined.  He is not due for prostate cancer screening or colon cancer screening until age 59.  He is requesting that we check his vitamins and minerals.  I explained to the patient that his insurance would likely not cover that for no symptoms.  He is on methotrexate so I believe it is reasonable to check a vitamin B12 level.  He is also been on prednisone in the past putting him at high risk for osteoporosis and therefore I believe it is reasonable to check for vitamin D deficiency which would contribute to osteoporosis.   Unless there is an abnormality on his CBC I would not check for iron or folate.  I will check a CMP.  At that time, my plan was: 1. Prehypertension Patient has borderline hypertension.  After a long discussion today, the patient declines medication for his blood pressure.  He will continue to monitor and if his systolic blood pressures greater than 366 or his diastolic blood pressures greater than 90 on a consistent basis he will call me. - CBC with Differential/Platelet - COMPLETE METABOLIC PANEL WITH GFR  2. Prediabetes Last hemoglobin A1c was 5.6.  I believe we can space this out to checking every 6 months rather than every 3 months.  3. Chronic bilateral low back pain without sciatica Patient continues to show no evidence of abuse or diversion.  Therefore I will continue to prescribe his hydrocodone as currently prescribed.  12/06/19 Patient states that his back pain has been giving him more trouble recently.  He states that almost every day he is having pain in his lower back.  It occurs in a bandlike pattern roughly at the level of L5.  He states that it hurts to sit down.  It hurts to twist.  He states that even hurts sometimes to have a bowel movement.  He is doing stretches that involve stretching his hamstring and his gluteal muscles as well as the muscles in his lower back which seem to help.  He is sleeping in bed with a pillow between his knees which also helps.  However he is having to use the tizanidine more often.  He only uses the hydrocodone occasionally and he is trying to avoid using this more.  He denies any numbness or tingling in his legs.  He denies any weakness in his legs.  He denies any bowel or bladder incontinence.  His blood pressure today is well controlled at 136/86.  He denies any chest pain shortness of breath dyspnea on exertion.  He is due for lab work to monitor his diabetes/prediabetes Past Medical History:  Diagnosis Date  . Asthma   . Atopic dermatitis   .  GERD (gastroesophageal reflux disease)   . Gout   . Low back pain    Chronic  . RLS (restless legs syndrome)    Past Surgical History:  Procedure Laterality Date  . EYE SURGERY  02/24/14/15   cataracts rt eye   Current Outpatient Medications on File Prior to Visit  Medication Sig Dispense Refill  . Artificial Tear Ointment (ARTIFICIAL TEARS) ointment Place 1 application into both eyes daily.     Marland Kitchen BEPREVE 1.5 % SOLN Place 1 drop into both eyes 2 (two) times daily.  11  . budesonide-formoterol (SYMBICORT) 160-4.5 MCG/ACT inhaler INHALE 2 PUFFS INTO THE LUNGS TWICE A DAY 30.6 Inhaler 1  . carboxymethylcellulose (REFRESH PLUS) 0.5 % SOLN Place 1 drop into both eyes 2 (two) times daily.     . cetirizine (ZYRTEC) 10 MG tablet Take 10 mg by mouth daily.    . clobetasol cream (TEMOVATE) [id number] % Apply 1 application topically 2 (two) times daily.     . fish oil-omega-3 fatty acids 1000 MG capsule Take 2 g by mouth daily.    . folic acid (FOLVITE) 1 MG tablet Take 1 mg by mouth daily.  8  . HYDROcodone-acetaminophen (NORCO/VICODIN) 5-325 MG tablet Take 1 tablet by mouth every 6 (six) hours as needed for moderate pain. 30 tablet 0  . hydrocortisone valerate cream (WESTCORT) 0.2 % Apply 1 application topically daily.     Marland Kitchen ibuprofen (ADVIL,MOTRIN) 200 MG tablet Take 200-400 mg by mouth every 6 (six) hours as needed for mild pain or moderate pain.    . methotrexate (RHEUMATREX) 2.5 MG tablet Take 10 mg by mouth once a week. Caution:Chemotherapy. Protect from light. Take on sundays Pt takes 4 pills every Sunday to equal 10 mg    . montelukast (SINGULAIR) 10 MG tablet Take 1 tablet (10 mg total) by mouth at bedtime. 90 tablet 3  . pimecrolimus (ELIDEL) 1 % cream Apply topically 2 (two) times daily.    . prednisoLONE acetate (PRED FORTE) 1 % ophthalmic suspension Place 1 drop into both eyes daily.  0  . tiZANidine (ZANAFLEX) 4 MG tablet TAKE 1 TABLET BY MOUTH EVERY 6 HOURS AS NEEDED FOR  MUSCLE SPASM 60  tablet 2   No current facility-administered medications on file prior to visit.   Allergies  Allergen Reactions  . Coconut Oil Hives and Itching    MAKES THROAT ITCH, ALSO  . Other Hives and Itching    NO TREE NUTS  . Peanut-Containing Drug Products Hives and Itching    MAKES THROAT ITCH, ALSO  . Latex Rash   Social History   Socioeconomic History  . Marital status: Single    Spouse name: Not on file  . Number of children: Not on file  . Years of education: Not on file  . Highest education level: Not on file  Occupational History  . Not on file  Tobacco Use  . Smoking status: Former Smoker    Quit date: 11/25/2011    Years since quitting: 8.0  . Smokeless tobacco: Never Used  Substance and Sexual Activity  . Alcohol use: No  . Drug use: Not on file  . Sexual activity: Not on file  Other Topics Concern  . Not on file  Social History Narrative  . Not on file   Social Determinants of Health   Financial Resource Strain:   . Difficulty of Paying Living Expenses: Not on file  Food Insecurity:   . Worried About Charity fundraiser in the Last Year: Not on file  . Ran Out of Food in the Last Year: Not on file  Transportation Needs:   . Lack of Transportation (Medical): Not on file  . Lack of Transportation (Non-Medical): Not on file  Physical Activity:   . Days of Exercise per Week: Not on file  . Minutes of Exercise per Session: Not on file  Stress:   . Feeling of Stress : Not on file  Social Connections:   . Frequency of Communication with Friends and Family: Not on file  . Frequency of Social Gatherings with Friends and Family: Not on file  . Attends Religious Services: Not on file  . Active Member of Clubs or Organizations: Not on file  . Attends Archivist Meetings: Not on file  . Marital Status: Not on file  Intimate Partner Violence:   . Fear of Current or Ex-Partner: Not on file  . Emotionally Abused: Not on file  . Physically Abused: Not on file   . Sexually Abused: Not on file      Review of Systems  Musculoskeletal: Positive for back pain.  All other systems reviewed and are negative.      Objective:   Physical Exam Vitals reviewed.  Constitutional:      General: He is not in acute distress.    Appearance: Normal appearance. He is obese. He is not ill-appearing.  Cardiovascular:     Rate and Rhythm: Normal rate and regular rhythm.     Pulses: Normal pulses.     Heart sounds: Normal heart sounds. No murmur. No friction rub. No gallop.   Pulmonary:     Effort: Pulmonary effort is normal. No respiratory distress.     Breath sounds: Normal breath sounds. No stridor. No wheezing, rhonchi or rales.  Chest:     Chest wall: No tenderness.  Abdominal:     General: Bowel sounds are normal.     Palpations: Abdomen is soft.     Tenderness: There is no abdominal tenderness. There is no guarding or rebound.  Musculoskeletal:     Right lower leg: No edema.     Left lower leg: No edema.  Skin:    Findings: Rash present.  Neurological:     Mental Status: He is alert.           Assessment & Plan:   Prediabetes - Plan: Hemoglobin A1c, CBC with Differential/Platelet, COMPLETE METABOLIC PANEL WITH GFR, Lipid panel  Prehypertension - Plan: Hemoglobin A1c, CBC with Differential/Platelet, COMPLETE METABOLIC PANEL WITH GFR, Lipid panel  High risk medication use  Chronic bilateral low back pain without sciatica - Plan: DG Lumbar Spine Complete, meloxicam (MOBIC) 15 MG tablet  Proceed with an x-ray of the lumbar spine to reevaluate his degenerative disc disease as his condition seems to worsen.  I recommended that he start meloxicam 15 mg every day as an NSAID to maintain and reduce the inflammation in his lower back.  I believe this will help manage his back pain better.  Encouraged the patient to continue his daily stretching regimen as I believe this also helps.  Patient will call me back in 3 to 4 weeks and if the meloxicam  is beneficial I would likely start the patient on a PPI to help reduce his risk of gastritis related to NSAID overuse.  Blood pressure today is well controlled.  I will check a CBC, CMP, fasting lipid panel, and hemoglobin A1c to monitor his prediabetes.

## 2019-12-07 LAB — LIPID PANEL
Cholesterol: 175 mg/dL (ref <200)
HDL: 47 mg/dL (ref >=40)
LDL Cholesterol (Calc): 101 mg/dL (calc) — ABNORMAL HIGH
Non-HDL Cholesterol (Calc): 128 mg/dL (calc) (ref <130)
Total CHOL/HDL Ratio: 3.7 (calc) (ref <5.0)
Triglycerides: 178 mg/dL — ABNORMAL HIGH (ref <150)

## 2019-12-07 LAB — COMPLETE METABOLIC PANEL WITH GFR
AG Ratio: 1.7 (calc) (ref 1.0–2.5)
ALT: 25 U/L (ref 9–46)
AST: 20 U/L (ref 10–40)
Albumin: 4.5 g/dL (ref 3.6–5.1)
Alkaline phosphatase (APISO): 74 U/L (ref 36–130)
BUN: 12 mg/dL (ref 7–25)
CO2: 25 mmol/L (ref 20–32)
Calcium: 10 mg/dL (ref 8.6–10.3)
Chloride: 105 mmol/L (ref 98–110)
Creat: 0.81 mg/dL (ref 0.60–1.35)
GFR, Est African American: 123 mL/min/{1.73_m2} (ref >=60)
GFR, Est Non African American: 106 mL/min/{1.73_m2} (ref >=60)
Globulin: 2.7 g/dL (calc) (ref 1.9–3.7)
Glucose, Bld: 89 mg/dL (ref 65–99)
Potassium: 4.4 mmol/L (ref 3.5–5.3)
Sodium: 141 mmol/L (ref 135–146)
Total Bilirubin: 0.3 mg/dL (ref 0.2–1.2)
Total Protein: 7.2 g/dL (ref 6.1–8.1)

## 2019-12-07 LAB — CBC WITH DIFFERENTIAL/PLATELET
Absolute Monocytes: 760 cells/uL (ref 200–950)
Basophils Absolute: 53 cells/uL (ref 0–200)
Basophils Relative: 0.7 %
Eosinophils Absolute: 312 cells/uL (ref 15–500)
Eosinophils Relative: 4.1 %
HCT: 42.3 % (ref 38.5–50.0)
Hemoglobin: 14.3 g/dL (ref 13.2–17.1)
Lymphs Abs: 1672 cells/uL (ref 850–3900)
MCH: 29.9 pg (ref 27.0–33.0)
MCHC: 33.8 g/dL (ref 32.0–36.0)
MCV: 88.5 fL (ref 80.0–100.0)
MPV: 9.8 fL (ref 7.5–12.5)
Monocytes Relative: 10 %
Neutro Abs: 4803 cells/uL (ref 1500–7800)
Neutrophils Relative %: 63.2 %
Platelets: 244 10*3/uL (ref 140–400)
RBC: 4.78 10*6/uL (ref 4.20–5.80)
RDW: 13.4 % (ref 11.0–15.0)
Total Lymphocyte: 22 %
WBC: 7.6 10*3/uL (ref 3.8–10.8)

## 2019-12-07 LAB — HEMOGLOBIN A1C
Hgb A1c MFr Bld: 5.8 % of total Hgb — ABNORMAL HIGH (ref <5.7)
Mean Plasma Glucose: 120 (calc)
eAG (mmol/L): 6.6 (calc)

## 2020-01-17 ENCOUNTER — Other Ambulatory Visit: Payer: Self-pay | Admitting: Family Medicine

## 2020-01-17 ENCOUNTER — Encounter: Payer: Self-pay | Admitting: Family Medicine

## 2020-01-17 DIAGNOSIS — G8929 Other chronic pain: Secondary | ICD-10-CM

## 2020-01-17 DIAGNOSIS — M545 Low back pain, unspecified: Secondary | ICD-10-CM

## 2020-01-17 MED ORDER — MELOXICAM 7.5 MG PO TABS: 7.5000 mg | ORAL_TABLET | Freq: Every day | ORAL | 3 refills | Status: DC

## 2020-01-28 ENCOUNTER — Encounter: Payer: Self-pay | Admitting: Family Medicine

## 2020-02-13 DIAGNOSIS — H33102 Unspecified retinoschisis, left eye: Secondary | ICD-10-CM | POA: Diagnosis not present

## 2020-02-13 DIAGNOSIS — H18611 Keratoconus, stable, right eye: Secondary | ICD-10-CM | POA: Diagnosis not present

## 2020-02-13 DIAGNOSIS — L2089 Other atopic dermatitis: Secondary | ICD-10-CM | POA: Diagnosis not present

## 2020-02-13 DIAGNOSIS — H04123 Dry eye syndrome of bilateral lacrimal glands: Secondary | ICD-10-CM | POA: Diagnosis not present

## 2020-02-13 DIAGNOSIS — Z961 Presence of intraocular lens: Secondary | ICD-10-CM | POA: Diagnosis not present

## 2020-02-19 ENCOUNTER — Encounter: Payer: Self-pay | Admitting: Family Medicine

## 2020-02-20 ENCOUNTER — Other Ambulatory Visit: Payer: Self-pay | Admitting: Family Medicine

## 2020-02-20 DIAGNOSIS — M545 Low back pain, unspecified: Secondary | ICD-10-CM

## 2020-02-20 MED ORDER — HYDROCODONE-ACETAMINOPHEN 5-325 MG PO TABS: 1.0000 | ORAL_TABLET | Freq: Four times a day (QID) | ORAL | 0 refills | Status: DC | PRN

## 2020-02-20 MED ORDER — LOSARTAN POTASSIUM 50 MG PO TABS: 50.0000 mg | ORAL_TABLET | Freq: Every day | ORAL | 3 refills | Status: DC

## 2020-02-26 ENCOUNTER — Other Ambulatory Visit: Payer: Self-pay | Admitting: Family Medicine

## 2020-02-26 MED ORDER — MONTELUKAST SODIUM 10 MG PO TABS: 10.0000 mg | ORAL_TABLET | Freq: Every day | ORAL | 3 refills | Status: DC

## 2020-03-11 ENCOUNTER — Encounter: Payer: Self-pay | Admitting: Family Medicine

## 2020-04-14 ENCOUNTER — Other Ambulatory Visit: Payer: Self-pay

## 2020-04-14 DIAGNOSIS — M545 Low back pain, unspecified: Secondary | ICD-10-CM

## 2020-04-14 MED ORDER — TIZANIDINE HCL 4 MG PO TABS: 4.0000 mg | ORAL_TABLET | Freq: Four times a day (QID) | ORAL | 1 refills | Status: DC | PRN

## 2020-04-14 NOTE — Telephone Encounter (Signed)
Ok to refill 

## 2020-04-21 ENCOUNTER — Other Ambulatory Visit: Payer: Self-pay

## 2020-04-21 DIAGNOSIS — M545 Low back pain, unspecified: Secondary | ICD-10-CM

## 2020-04-21 MED ORDER — HYDROCODONE-ACETAMINOPHEN 5-325 MG PO TABS: 1.0000 | ORAL_TABLET | Freq: Four times a day (QID) | ORAL | 0 refills | Status: DC | PRN

## 2020-04-21 NOTE — Telephone Encounter (Signed)
Last refilled 02/20/20 Last OV 12/07/19

## 2020-05-05 DIAGNOSIS — L2089 Other atopic dermatitis: Secondary | ICD-10-CM | POA: Diagnosis not present

## 2020-05-05 DIAGNOSIS — Z79899 Other long term (current) drug therapy: Secondary | ICD-10-CM | POA: Diagnosis not present

## 2020-06-05 ENCOUNTER — Ambulatory Visit (INDEPENDENT_AMBULATORY_CARE_PROVIDER_SITE_OTHER): Payer: Medicare Other | Admitting: Family Medicine

## 2020-06-05 ENCOUNTER — Other Ambulatory Visit: Payer: Self-pay

## 2020-06-05 ENCOUNTER — Encounter: Payer: Self-pay | Admitting: Family Medicine

## 2020-06-05 VITALS — BP 152/92 | HR 90 | Temp 97.7°F | Resp 18 | Wt 211.0 lb

## 2020-06-05 DIAGNOSIS — Z136 Encounter for screening for cardiovascular disorders: Secondary | ICD-10-CM | POA: Diagnosis not present

## 2020-06-05 DIAGNOSIS — M545 Low back pain, unspecified: Secondary | ICD-10-CM

## 2020-06-05 DIAGNOSIS — R7303 Prediabetes: Secondary | ICD-10-CM

## 2020-06-05 DIAGNOSIS — E559 Vitamin D deficiency, unspecified: Secondary | ICD-10-CM

## 2020-06-05 DIAGNOSIS — Z13220 Encounter for screening for lipoid disorders: Secondary | ICD-10-CM | POA: Diagnosis not present

## 2020-06-05 MED ORDER — AMLODIPINE BESYLATE 10 MG PO TABS
10.0000 mg | ORAL_TABLET | Freq: Every day | ORAL | 3 refills | Status: DC
Start: 2020-06-05 — End: 2021-05-03

## 2020-06-05 NOTE — Progress Notes (Signed)
Subjective:    Patient ID: Adam Lozano, male    DOB: 12/06/1970, 49 y.o.   MRN: [id number]  Medication Refill  Hypertension  Back Pain    2/20 Patient is a 49 year old Caucasian male who is on disability due to chronic low back pain as well as severe atopic dermatitis.  Due to his atopic dermatitis, he is on methotrexate.  His liver tests are supposedly monitored by his dermatologist according to the patient.  He has not had the shingles vaccine.  His last Pneumovax 23 was more than 14 years ago.  He did get his flu shot earlier this year.  He also has a history of borderline type 2 diabetes mellitus.  He is due to recheck hemoglobin A1c.  He quit smoking 7 years ago although he does occasionally smoke marijuana.  His blood pressure slightly elevated today at 142/88.  He admits that he is not getting any regular exercise.  He denies any chest pain shortness of breath or dyspnea on exertion.  He is suffering from chronic blepharitis.  He manages his chronic low back pain with approximately 1 hydrocodone every day.  He usually takes this at night to help him sleep.  He also uses tizanidine to help with muscle spasms in his lower back at roughly the level of L4-L5.  The majority of his pain sound like muscle spasms.  They occur with prolonged standing or prolonged sitting.  They are located on either side of his spine roughly the level of L4-L5.  He denies any lumbar radiculopathy.  At that time, my plan was: Patient's blood pressure today is borderline.  I recommended regular aerobic exercise to help decrease his blood pressure hopefully 4 5 points.  I would also recommend sodium restriction and weight loss.  Due to his chronic immunosuppressive state due to methotrexate, I recommended the patient receive a booster on Pneumovax 23.  His flu shot is up-to-date.  Also recommended that the patient inquire as to the shingles vaccine shin Grix at his local pharmacy.  He is not due for colonoscopy or  prostate cancer screening until age 82.  Given his history of prediabetes, I recommended the patient return fasting for hemoglobin A1c, CBC, CMP and a fasting lipid panel.  I will continue to prescribe the patient's tizanidine for chronic low back pain which primarily sounds to be muscle spasms secondary to mild degenerative disc disease.  As long as he is using 1 hydrocodone at night, I did not have a problem prescribing this.  Regular anticipatory guidance is provided  03/01/19 Patient is here today for follow-up.  His blood pressure remains borderline at 138/90.  However he states that he is checking his blood pressure at home and that it is running higher at home although he cannot remember exactly how high his blood pressures running.  He denies any chest pain shortness of breath or dyspnea on exertion.  He does have a history of prediabetes with hemoglobin A1c is greater than 6.  He denies any polyuria, polydipsia, or blurry vision.  His immune system is compromised as he is on methotrexate.  However he denies any fevers chills shortness of breath or cough.  He is trying to self quarantine to avoid acquiring COVID-19.  He continues to deal with constant low back pain.  He mainly uses hydrocodone at night to help him sleep.  He is also using Zanaflex as needed for muscle spasms in his lower back.  He continues to deny any  symptoms of radiculopathy.  He denies any symptoms of cauda equina syndrome.  At that time, my plan was: I believe his blood pressure could be reaching the point we may need to institute medication.  I asked the patient check his blood pressure morning and night for the next week and record those values and bring them to me so that I can review them.  If greater than 140/90, I would recommend starting hydrochlorothiazide to reduce his blood pressure and reduce his long-term risk of cardiovascular disease.  Patient is a high risk of a 8 patient due to his compromised immune system due to  methotrexate as well as his underlying history of asthma.  Therefore I recommended that he self quarantine at home at the present time despite the lifting of restrictions on television.  Continue hydrocodone as needed for back pain.  Monitor his prediabetes by obtaining a CMP, fasting lipid panel, and hemoglobin A1c today.  06/03/19 LDL was 107 in May and HgA1c was 5.6.  These were very good and I was very pleased with him.  The patient has been checking his blood pressure frequently at home.  The vast majority of systolic blood pressures are between 110 and 130/60-80.  However I did say 30% of the time, the patient's systolic blood pressure is higher than 140.  Occasionally he will have a diastolic blood pressure higher than 90.  We discussed starting medication such as a low-dose amlodipine for high blood pressure.  Patient is hesitant to start any medication and prefers to continue to monitor it.  He states that his blood pressure was high due to exertion.  He has not been waiting after he physically exerts himself before he checks his blood pressure.  He believes that this is the reason some of his blood pressures were so high.  He denies any chest pain or shortness of breath or dyspnea on exertion.  He has no carotid bruit today on exam and there is no pitting edema.  He denies any orthopnea or paroxysmal nocturnal dyspnea.  He is due for a flu shot which I recommended but he politely declined.  He is not due for prostate cancer screening or colon cancer screening until age 59.  He is requesting that we check his vitamins and minerals.  I explained to the patient that his insurance would likely not cover that for no symptoms.  He is on methotrexate so I believe it is reasonable to check a vitamin B12 level.  He is also been on prednisone in the past putting him at high risk for osteoporosis and therefore I believe it is reasonable to check for vitamin D deficiency which would contribute to osteoporosis.   Unless there is an abnormality on his CBC I would not check for iron or folate.  I will check a CMP.  At that time, my plan was: 1. Prehypertension Patient has borderline hypertension.  After a long discussion today, the patient declines medication for his blood pressure.  He will continue to monitor and if his systolic blood pressures greater than 366 or his diastolic blood pressures greater than 90 on a consistent basis he will call me. - CBC with Differential/Platelet - COMPLETE METABOLIC PANEL WITH GFR  2. Prediabetes Last hemoglobin A1c was 5.6.  I believe we can space this out to checking every 6 months rather than every 3 months.  3. Chronic bilateral low back pain without sciatica Patient continues to show no evidence of abuse or diversion.  Therefore I will continue to prescribe his hydrocodone as currently prescribed.  12/06/19 Patient states that his back pain has been giving him more trouble recently.  He states that almost every day he is having pain in his lower back.  It occurs in a bandlike pattern roughly at the level of L5.  He states that it hurts to sit down.  It hurts to twist.  He states that even hurts sometimes to have a bowel movement.  He is doing stretches that involve stretching his hamstring and his gluteal muscles as well as the muscles in his lower back which seem to help.  He is sleeping in bed with a pillow between his knees which also helps.  However he is having to use the tizanidine more often.  He only uses the hydrocodone occasionally and he is trying to avoid using this more.  He denies any numbness or tingling in his legs.  He denies any weakness in his legs.  He denies any bowel or bladder incontinence.  His blood pressure today is well controlled at 136/86.  He denies any chest pain shortness of breath dyspnea on exertion.  He is due for lab work to monitor his diabetes/prediabetes.  At that time, my plan was: Proceed with an x-ray of the lumbar spine to  reevaluate his degenerative disc disease as his condition seems to worsen.  I recommended that he start meloxicam 15 mg every day as an NSAID to maintain and reduce the inflammation in his lower back.  I believe this will help manage his back pain better.  Encouraged the patient to continue his daily stretching regimen as I believe this also helps.  Patient will call me back in 3 to 4 weeks and if the meloxicam is beneficial I would likely start the patient on a PPI to help reduce his risk of gastritis related to NSAID overuse.  Blood pressure today is well controlled.  I will check a CBC, CMP, fasting lipid panel, and hemoglobin A1c to monitor his prediabetes.    06/05/20 Wt Readings from Last 3 Encounters:  06/05/20 211 lb (95.7 kg)  12/06/19 193 lb (87.5 kg)  06/03/19 190 lb (86.2 kg)   Patient has been taking the meloxicam sporadically.  He is not taking any NSAID on a daily basis.  He takes it 2 or 3 times a week only when his back flares.  It does seem to help with his back.  However since I last saw the patient he has gained approximately 18 pounds.  This makes me concerned about his prediabetes worsening.  His blood pressure has also steadily been rising.  Today is elevated at 152/92.  He is checking it frequently at home and typically seeing his blood pressure in the 140s.  Occasionally will be up to 155.  He is not tolerating the losartan.  The losartan gives him a headache.  He is having to split the 50 mg tablet in half and taking half top twice a day just to tolerate it.  He denies any chest pain shortness of breath or dyspnea on exertion.  He denies any abdominal pain.  He denies any polyuria polydipsia or blurry vision Past Medical History:  Diagnosis Date  . Asthma   . Atopic dermatitis   . GERD (gastroesophageal reflux disease)   . Gout   . Low back pain    Chronic  . RLS (restless legs syndrome)    Past Surgical History:  Procedure Laterality Date  .  EYE SURGERY  02/24/14/15    cataracts rt eye   Current Outpatient Medications on File Prior to Visit  Medication Sig Dispense Refill  . Artificial Tear Ointment (ARTIFICIAL TEARS) ointment Place 1 application into both eyes daily.     Marland Kitchen BEPREVE 1.5 % SOLN Place 1 drop into both eyes 2 (two) times daily.  11  . budesonide-formoterol (SYMBICORT) 160-4.5 MCG/ACT inhaler INHALE 2 PUFFS INTO THE LUNGS TWICE A DAY 30.6 Inhaler 1  . carboxymethylcellulose (REFRESH PLUS) 0.5 % SOLN Place 1 drop into both eyes 2 (two) times daily.     . cetirizine (ZYRTEC) 10 MG tablet Take 10 mg by mouth daily.    . clobetasol cream (TEMOVATE) 1.70 % Apply 1 application topically 2 (two) times daily.     . fish oil-omega-3 fatty acids 1000 MG capsule Take 1 g by mouth daily.     . folic acid (FOLVITE) 1 MG tablet Take 1 mg by mouth daily.  8  . HYDROcodone-acetaminophen (NORCO/VICODIN) 5-325 MG tablet Take 1 tablet by mouth every 6 (six) hours as needed for moderate pain. 30 tablet 0  . hydrocortisone valerate cream (WESTCORT) 0.2 % Apply 1 application topically daily.     Marland Kitchen losartan (COZAAR) 50 MG tablet Take 1 tablet (50 mg total) by mouth daily. 90 tablet 3  . meloxicam (MOBIC) 7.5 MG tablet Take 1 tablet (7.5 mg total) by mouth daily. 30 tablet 3  . methotrexate (RHEUMATREX) 2.5 MG tablet Take 10 mg by mouth once a week. Caution:Chemotherapy. Protect from light. Take on sundays Pt takes 4 pills every Sunday to equal 10 mg    . montelukast (SINGULAIR) 10 MG tablet Take 1 tablet (10 mg total) by mouth at bedtime. 90 tablet 3  . pimecrolimus (ELIDEL) 1 % cream Apply topically 2 (two) times daily.    . prednisoLONE acetate (PRED FORTE) 1 % ophthalmic suspension Place 1 drop into both eyes daily.  0  . tiZANidine (ZANAFLEX) 4 MG tablet Take 1 tablet (4 mg total) by mouth every 6 (six) hours as needed for muscle spasms. 60 tablet 1   No current facility-administered medications on file prior to visit.   Allergies  Allergen Reactions  . Coconut  Oil Hives and Itching    MAKES THROAT ITCH, ALSO  . Other Hives and Itching    NO TREE NUTS  . Peanut-Containing Drug Products Hives and Itching    MAKES THROAT ITCH, ALSO  . Latex Rash   Social History   Socioeconomic History  . Marital status: Single    Spouse name: Not on file  . Number of children: Not on file  . Years of education: Not on file  . Highest education level: Not on file  Occupational History  . Not on file  Tobacco Use  . Smoking status: Former Smoker    Quit date: 11/25/2011    Years since quitting: 8.5  . Smokeless tobacco: Never Used  Substance and Sexual Activity  . Alcohol use: No  . Drug use: Not on file  . Sexual activity: Not on file  Other Topics Concern  . Not on file  Social History Narrative  . Not on file   Social Determinants of Health   Financial Resource Strain:   . Difficulty of Paying Living Expenses:   Food Insecurity:   . Worried About Charity fundraiser in the Last Year:   . Arboriculturist in the Last Year:   Transportation Needs:   .  Lack of Transportation (Medical):   Marland Kitchen Lack of Transportation (Non-Medical):   Physical Activity:   . Days of Exercise per Week:   . Minutes of Exercise per Session:   Stress:   . Feeling of Stress :   Social Connections:   . Frequency of Communication with Friends and Family:   . Frequency of Social Gatherings with Friends and Family:   . Attends Religious Services:   . Active Member of Clubs or Organizations:   . Attends Archivist Meetings:   Marland Kitchen Marital Status:   Intimate Partner Violence:   . Fear of Current or Ex-Partner:   . Emotionally Abused:   Marland Kitchen Physically Abused:   . Sexually Abused:       Review of Systems  Musculoskeletal: Positive for back pain.  All other systems reviewed and are negative.      Objective:   Physical Exam Vitals reviewed.  Constitutional:      General: He is not in acute distress.    Appearance: Normal appearance. He is obese. He is not  ill-appearing.  Cardiovascular:     Rate and Rhythm: Normal rate and regular rhythm.     Pulses: Normal pulses.     Heart sounds: Normal heart sounds. No murmur heard.  No friction rub. No gallop.   Pulmonary:     Effort: Pulmonary effort is normal. No respiratory distress.     Breath sounds: Normal breath sounds. No stridor. No wheezing, rhonchi or rales.  Chest:     Chest wall: No tenderness.  Abdominal:     General: Bowel sounds are normal.     Palpations: Abdomen is soft.     Tenderness: There is no abdominal tenderness. There is no guarding or rebound.  Musculoskeletal:     Right lower leg: No edema.     Left lower leg: No edema.  Skin:    Findings: Rash present.  Neurological:     Mental Status: He is alert.           Assessment & Plan:    Prediabetes - Plan: Hemoglobin A1c, CBC with Differential/Platelet, COMPLETE METABOLIC PANEL WITH GFR, Lipid panel  Bilateral low back pain without sciatica, unspecified chronicity  Vitamin D deficiency - Plan: VITAMIN D 25 Hydroxy (Vit-D Deficiency, Fractures)  Patient has a longstanding history of steroid use.  He also has a history of vitamin D deficiency.  Therefore he would be at high risk for osteoporosis.  Today we will check a vitamin D level.  His blood pressure is elevated today.  We will discontinue losartan due to the side effects and switch to amlodipine 10 mg a day.  Again I cautioned the patient to avoid overusing NSAIDs due to the potential GI and renal toxicity.  Due to his weight gain I will check an A1c, CMP, and a fasting lipid panel.  Goal LDL cholesterol is less than 100.  Goal A1c is less than 6.5.  Counseled weight loss.  Consider doing a bone density test when the patient turns 51.

## 2020-06-06 LAB — COMPLETE METABOLIC PANEL WITH GFR
AG Ratio: 1.9 (calc) (ref 1.0–2.5)
ALT: 31 U/L (ref 9–46)
AST: 18 U/L (ref 10–40)
Albumin: 4.3 g/dL (ref 3.6–5.1)
Alkaline phosphatase (APISO): 67 U/L (ref 36–130)
BUN: 13 mg/dL (ref 7–25)
CO2: 25 mmol/L (ref 20–32)
Calcium: 9.8 mg/dL (ref 8.6–10.3)
Chloride: 105 mmol/L (ref 98–110)
Creat: 0.8 mg/dL (ref 0.60–1.35)
GFR, Est African American: 123 mL/min/{1.73_m2} (ref >=60)
GFR, Est Non African American: 106 mL/min/{1.73_m2} (ref >=60)
Globulin: 2.3 g/dL (calc) (ref 1.9–3.7)
Glucose, Bld: 142 mg/dL — ABNORMAL HIGH (ref 65–99)
Potassium: 4 mmol/L (ref 3.5–5.3)
Sodium: 140 mmol/L (ref 135–146)
Total Bilirubin: 0.3 mg/dL (ref 0.2–1.2)
Total Protein: 6.6 g/dL (ref 6.1–8.1)

## 2020-06-06 LAB — LIPID PANEL
Cholesterol: 187 mg/dL (ref <200)
HDL: 44 mg/dL (ref >=40)
LDL Cholesterol (Calc): 107 mg/dL (calc) — ABNORMAL HIGH
Non-HDL Cholesterol (Calc): 143 mg/dL (calc) — ABNORMAL HIGH (ref <130)
Total CHOL/HDL Ratio: 4.3 (calc) (ref <5.0)
Triglycerides: 236 mg/dL — ABNORMAL HIGH (ref <150)

## 2020-06-06 LAB — VITAMIN D 25 HYDROXY (VIT D DEFICIENCY, FRACTURES): Vit D, 25-Hydroxy: 35 ng/mL (ref 30–100)

## 2020-06-06 LAB — CBC WITH DIFFERENTIAL/PLATELET
Absolute Monocytes: 764 cells/uL (ref 200–950)
Basophils Absolute: 50 cells/uL (ref 0–200)
Basophils Relative: 0.6 %
Eosinophils Absolute: 299 cells/uL (ref 15–500)
Eosinophils Relative: 3.6 %
HCT: 39.4 % (ref 38.5–50.0)
Hemoglobin: 13.5 g/dL (ref 13.2–17.1)
Lymphs Abs: 1926 cells/uL (ref 850–3900)
MCH: 30.5 pg (ref 27.0–33.0)
MCHC: 34.3 g/dL (ref 32.0–36.0)
MCV: 88.9 fL (ref 80.0–100.0)
MPV: 9.8 fL (ref 7.5–12.5)
Monocytes Relative: 9.2 %
Neutro Abs: 5262 cells/uL (ref 1500–7800)
Neutrophils Relative %: 63.4 %
Platelets: 250 10*3/uL (ref 140–400)
RBC: 4.43 10*6/uL (ref 4.20–5.80)
RDW: 13.3 % (ref 11.0–15.0)
Total Lymphocyte: 23.2 %
WBC: 8.3 10*3/uL (ref 3.8–10.8)

## 2020-06-06 LAB — HEMOGLOBIN A1C
Hgb A1c MFr Bld: 6.6 % of total Hgb — ABNORMAL HIGH (ref <5.7)
Mean Plasma Glucose: 143 (calc)
eAG (mmol/L): 7.9 (calc)

## 2020-06-12 ENCOUNTER — Encounter: Payer: Self-pay | Admitting: Family Medicine

## 2020-07-02 ENCOUNTER — Other Ambulatory Visit: Payer: Self-pay

## 2020-07-02 DIAGNOSIS — M545 Low back pain, unspecified: Secondary | ICD-10-CM

## 2020-07-03 MED ORDER — HYDROCODONE-ACETAMINOPHEN 5-325 MG PO TABS: 1.0000 | ORAL_TABLET | Freq: Four times a day (QID) | ORAL | 0 refills | Status: DC | PRN

## 2020-08-07 ENCOUNTER — Other Ambulatory Visit: Payer: Self-pay | Admitting: Family Medicine

## 2020-08-18 ENCOUNTER — Other Ambulatory Visit: Payer: Self-pay | Admitting: Family Medicine

## 2020-08-18 DIAGNOSIS — M545 Low back pain, unspecified: Secondary | ICD-10-CM

## 2020-08-22 IMAGING — DX DG LUMBAR SPINE COMPLETE 4+V
5 series · 5 of 5 positions shown · non-contrast
Comparison: 06/23/2014 radiographs

CLINICAL DATA: Chronic low back pain for several years.

EXAM:
LUMBAR SPINE - COMPLETE 4+ VIEW

[l-spine ap]
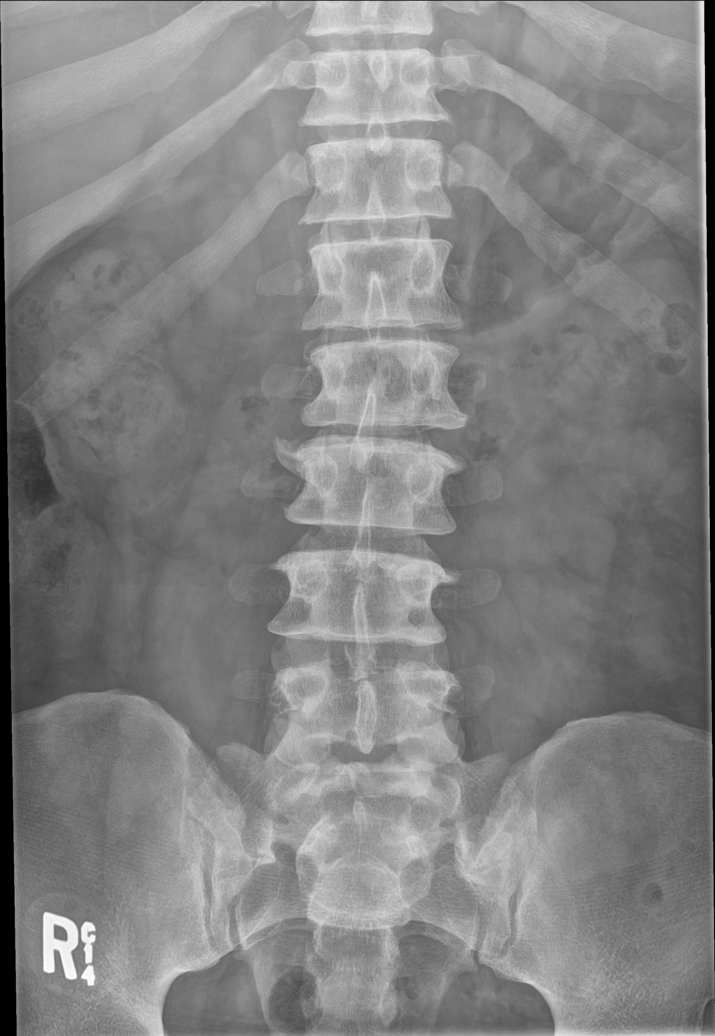

[l-spine obl (1 of 2)]
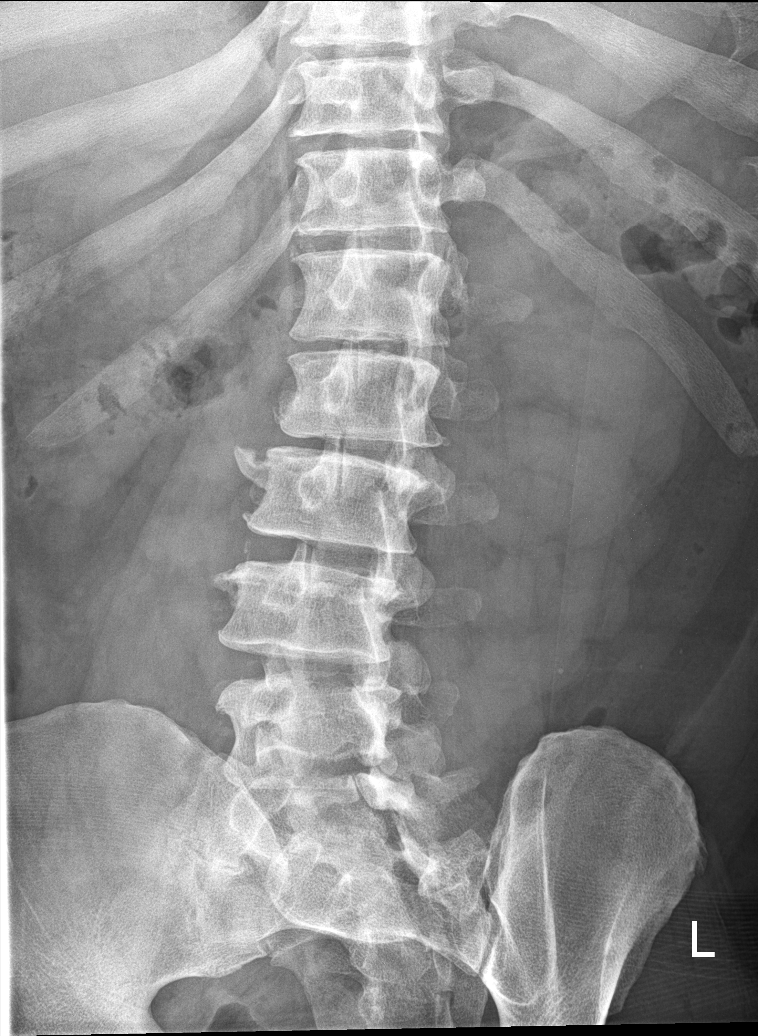

[l-spine obl (2 of 2)]
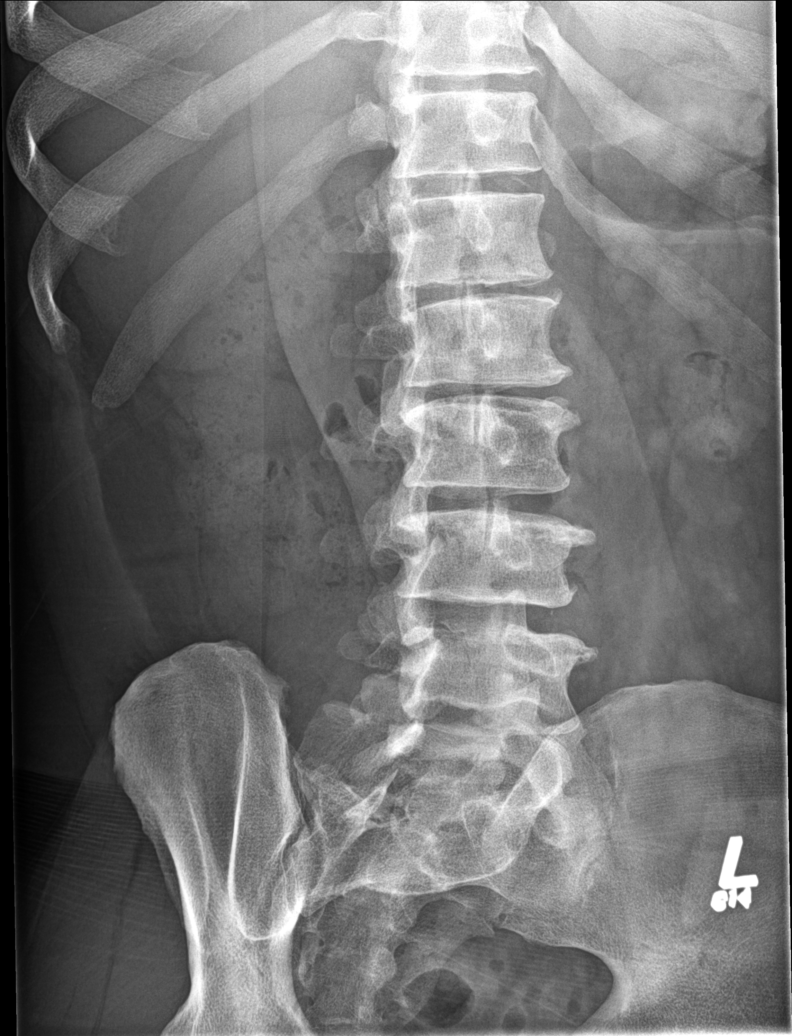

[l-spine lat]
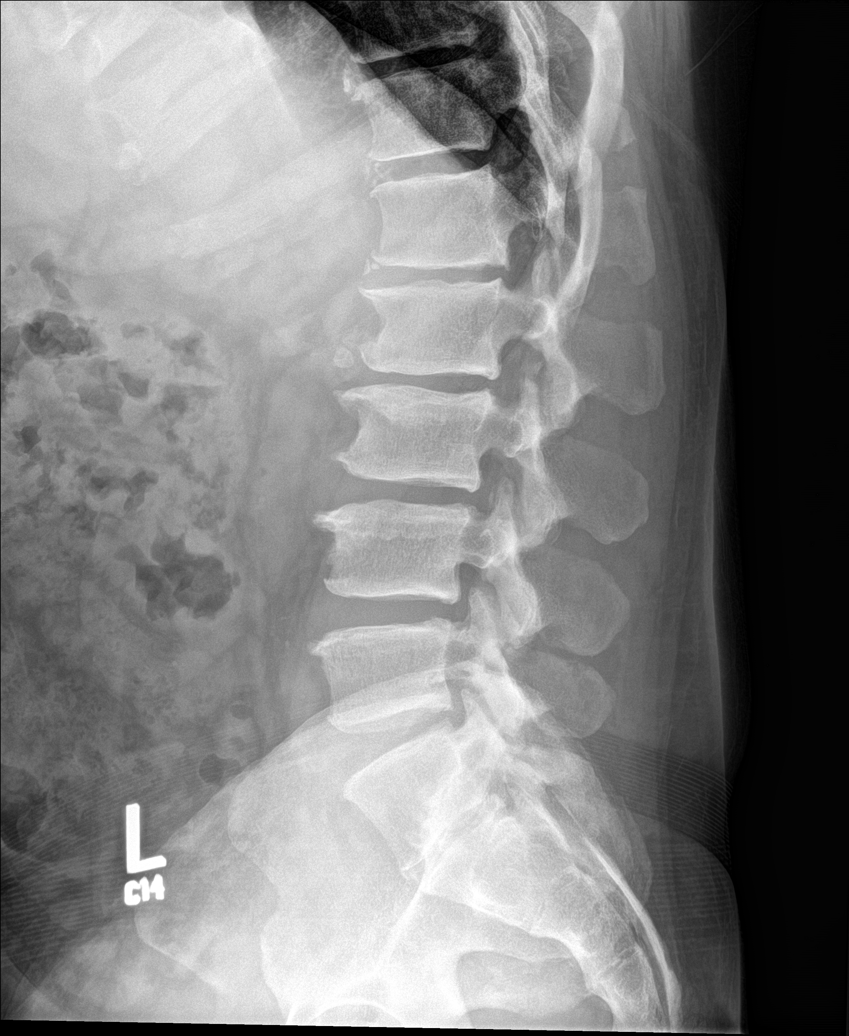

[l-spine spot]
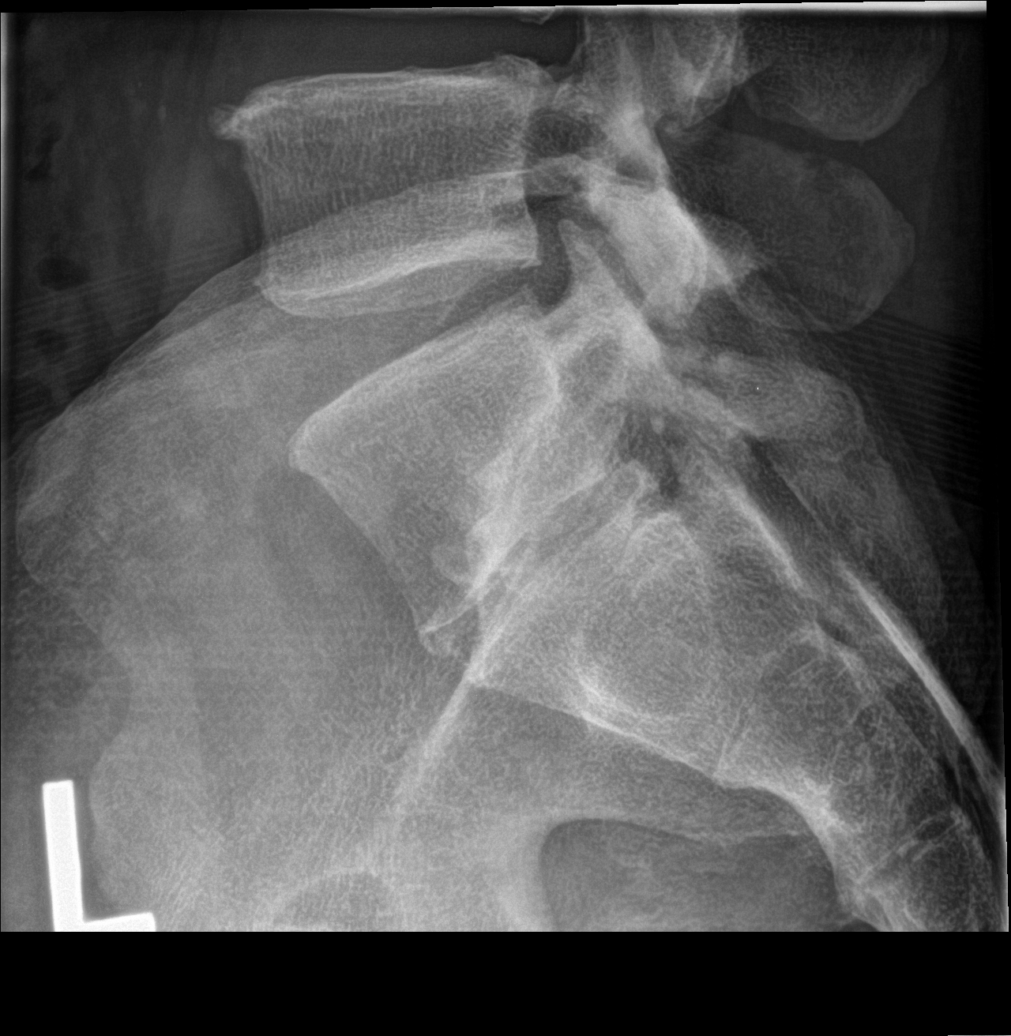

[5 of 5 positions shown; findings below may reference images not displayed]

FINDINGS: Mild-to-moderate joint space narrowing/spondylosis at L1-2 and L2-3
again identified. Mild degenerative disc disease/spondylosis at L3-4
and L4-5 again noted.

No acute fracture or subluxation.

No focal bony lesions or spondylolysis identified.
IMPRESSION: 1. No acute abnormality
2. Unchanged degenerative disc disease/spondylosis again noted.

## 2020-08-31 DIAGNOSIS — H1711 Central corneal opacity, right eye: Secondary | ICD-10-CM | POA: Diagnosis not present

## 2020-08-31 DIAGNOSIS — L2089 Other atopic dermatitis: Secondary | ICD-10-CM | POA: Diagnosis not present

## 2020-08-31 DIAGNOSIS — H18611 Keratoconus, stable, right eye: Secondary | ICD-10-CM | POA: Diagnosis not present

## 2020-09-01 DIAGNOSIS — Z79899 Other long term (current) drug therapy: Secondary | ICD-10-CM | POA: Diagnosis not present

## 2020-09-01 DIAGNOSIS — L2089 Other atopic dermatitis: Secondary | ICD-10-CM | POA: Diagnosis not present

## 2020-09-21 ENCOUNTER — Other Ambulatory Visit: Payer: Self-pay

## 2020-09-21 DIAGNOSIS — M545 Low back pain, unspecified: Secondary | ICD-10-CM

## 2020-09-21 MED ORDER — HYDROCODONE-ACETAMINOPHEN 5-325 MG PO TABS: 1.0000 | ORAL_TABLET | Freq: Four times a day (QID) | ORAL | 0 refills | Status: DC | PRN

## 2020-11-26 ENCOUNTER — Other Ambulatory Visit: Payer: Self-pay

## 2020-11-26 DIAGNOSIS — M545 Low back pain, unspecified: Secondary | ICD-10-CM

## 2020-11-26 MED ORDER — HYDROCODONE-ACETAMINOPHEN 5-325 MG PO TABS: 1.0000 | ORAL_TABLET | Freq: Four times a day (QID) | ORAL | 0 refills | Status: DC | PRN

## 2020-11-26 NOTE — Telephone Encounter (Signed)
Pt called office requesting med refill of Hydrocodone.   Last filled 09/21/20 Last seen 06/05/20

## 2020-12-02 DIAGNOSIS — L2089 Other atopic dermatitis: Secondary | ICD-10-CM | POA: Diagnosis not present

## 2020-12-02 DIAGNOSIS — Z79899 Other long term (current) drug therapy: Secondary | ICD-10-CM | POA: Diagnosis not present

## 2020-12-08 ENCOUNTER — Ambulatory Visit (INDEPENDENT_AMBULATORY_CARE_PROVIDER_SITE_OTHER): Payer: Medicare Other | Admitting: Family Medicine

## 2020-12-08 ENCOUNTER — Other Ambulatory Visit: Payer: Self-pay

## 2020-12-08 VITALS — BP 152/88 | HR 101 | Temp 98.0°F | Wt 200.0 lb

## 2020-12-08 DIAGNOSIS — I10 Essential (primary) hypertension: Secondary | ICD-10-CM

## 2020-12-08 DIAGNOSIS — E118 Type 2 diabetes mellitus with unspecified complications: Secondary | ICD-10-CM

## 2020-12-08 NOTE — Progress Notes (Signed)
Subjective:    Patient ID: Adam Lozano, male    DOB: Dec 15, 1970, 50 y.o.   MRN: [id number]  Patient is a very pleasant 50 year old Caucasian male here today for a checkup.  He has a history of eczema along with asthma for which she is on methotrexate due to the severity of the conditions.  This is managed by his dermatologist.  He is slowly trying to wean off the methotrexate.  He also uses a combination of Elavil and Westcort creams to help manage his eczema.  He is using meloxicam sparingly for back pain along with tizanidine.  However at his last visit, he was found to have developed diabetes.  His hemoglobin A1c was 6.6.  He admits to drinking a lot of sweet tea.  He does not eat a lot of sweets but he does eat a lot of carbohydrates in the form of starches.  He is here today to recheck his blood sugar and his cholesterol.  His blood pressure is also elevated at 152/88.  He has been checking it sporadically at home.  Typically he states that his systolic pressure is less than 140.  He denies any chest pain shortness of breath or dyspnea on exertion.  He denies any myalgias or right upper quadrant pain.  He denies any polyuria, polydipsia or blurry vision. Past Medical History:  Diagnosis Date  . Asthma   . Atopic dermatitis   . GERD (gastroesophageal reflux disease)   . Gout   . Low back pain    Chronic  . RLS (restless legs syndrome)    Past Surgical History:  Procedure Laterality Date  . EYE SURGERY  02/24/14/15   cataracts rt eye   Current Outpatient Medications on File Prior to Visit  Medication Sig Dispense Refill  . amLODipine (NORVASC) 10 MG tablet Take 1 tablet (10 mg total) by mouth daily. Stop losartan 90 tablet 3  . budesonide-formoterol (SYMBICORT) 160-4.5 MCG/ACT inhaler INHALE 2 PUFFS INTO THE LUNGS TWICE A DAY 10.2 each 6  . cetirizine (ZYRTEC) 10 MG tablet Take 10 mg by mouth daily.    . clobetasol cream (TEMOVATE) 4.09 % Apply 1 application topically 2 (two) times  daily.     . fish oil-omega-3 fatty acids 1000 MG capsule Take 1 g by mouth daily.     . folic acid (FOLVITE) 1 MG tablet Take 1 mg by mouth daily.  8  . HYDROcodone-acetaminophen (NORCO/VICODIN) 5-325 MG tablet Take 1 tablet by mouth every 6 (six) hours as needed for moderate pain. 30 tablet 0  . meloxicam (MOBIC) 7.5 MG tablet Take 1 tablet (7.5 mg total) by mouth daily. 30 tablet 3  . methotrexate (RHEUMATREX) 2.5 MG tablet Take 10 mg by mouth once a week. Caution:Chemotherapy. Protect from light. Take on sundays Pt takes 4 pills every Sunday to equal 10 mg    . pimecrolimus (ELIDEL) 1 % cream Apply topically 2 (two) times daily.    . prednisoLONE acetate (PRED FORTE) 1 % ophthalmic suspension Place 1 drop into both eyes daily.  0  . tiZANidine (ZANAFLEX) 4 MG tablet TAKE 1 TABLET BY MOUTH EVERY 6 HOURS AS NEEDED FOR MUSCLE SPASM 60 tablet 1  . Artificial Tear Ointment (ARTIFICIAL TEARS) ointment Place 1 application into both eyes daily.     Marland Kitchen BEPREVE 1.5 % SOLN Place 1 drop into both eyes 2 (two) times daily.  11  . carboxymethylcellulose (REFRESH PLUS) 0.5 % SOLN Place 1 drop into both eyes 2 (  two) times daily.     . hydrocortisone valerate cream (WESTCORT) 0.2 % Apply 1 application topically daily.     . montelukast (SINGULAIR) 10 MG tablet Take 1 tablet (10 mg total) by mouth at bedtime. (Patient not taking: Reported on 12/08/2020) 90 tablet 3   No current facility-administered medications on file prior to visit.   Allergies  Allergen Reactions  . Coconut Oil Hives and Itching    MAKES THROAT ITCH, ALSO  . Other Hives and Itching    NO TREE NUTS  . Peanut-Containing Drug Products Hives and Itching    MAKES THROAT ITCH, ALSO  . Latex Rash   Social History   Socioeconomic History  . Marital status: Single    Spouse name: Not on file  . Number of children: Not on file  . Years of education: Not on file  . Highest education level: Not on file  Occupational History  . Not on  file  Tobacco Use  . Smoking status: Former Smoker    Quit date: 11/25/2011    Years since quitting: 9.0  . Smokeless tobacco: Never Used  Substance and Sexual Activity  . Alcohol use: No  . Drug use: Not on file  . Sexual activity: Not on file  Other Topics Concern  . Not on file  Social History Narrative  . Not on file   Social Determinants of Health   Financial Resource Strain: Not on file  Food Insecurity: Not on file  Transportation Needs: Not on file  Physical Activity: Not on file  Stress: Not on file  Social Connections: Not on file  Intimate Partner Violence: Not on file      Review of Systems  Musculoskeletal: Positive for back pain.  All other systems reviewed and are negative.      Objective:   Physical Exam Vitals reviewed.  Constitutional:      General: He is not in acute distress.    Appearance: Normal appearance. He is obese. He is not ill-appearing.  Cardiovascular:     Rate and Rhythm: Normal rate and regular rhythm.     Pulses: Normal pulses.     Heart sounds: Normal heart sounds. No murmur heard. No friction rub. No gallop.   Pulmonary:     Effort: Pulmonary effort is normal. No respiratory distress.     Breath sounds: Normal breath sounds. No stridor. No wheezing, rhonchi or rales.  Chest:     Chest wall: No tenderness.  Abdominal:     General: Bowel sounds are normal.     Palpations: Abdomen is soft.     Tenderness: There is no abdominal tenderness. There is no guarding or rebound.  Musculoskeletal:     Right lower leg: No edema.     Left lower leg: No edema.  Skin:    Findings: Rash present.  Neurological:     Mental Status: He is alert.           Assessment & Plan:    Controlled type 2 diabetes mellitus with complication, without long-term current use of insulin (HCC) - Plan: Hemoglobin A1c, CBC with Differential/Platelet, COMPLETE METABOLIC PANEL WITH GFR, Lipid panel, Microalbumin, urine  Benign essential HTN  Repeat  his hemoglobin A1c.  He is currently a diet-controlled diabetic.  If his A1c rises above 7, we will need to institute medication.  I did recommend decreasing his carbohydrates specifically drop sweet tea and sodas from his diet.  Try to reduce his starchy carb intake.  Try  to exercise as much as possible.  He lost 11 pounds since his last visit which I congratulated him on.  Blood pressure is elevated today.  Of asked him to check his blood pressure daily at home.  If consistently greater than 140, I would consider adding additional medication to control his blood pressure.  Specifically I would recommend an angiotensin receptor blocker for renal protection

## 2020-12-09 LAB — LIPID PANEL
Cholesterol: 182 mg/dL (ref <200)
HDL: 44 mg/dL (ref >=40)
LDL Cholesterol (Calc): 98 mg/dL (calc)
Non-HDL Cholesterol (Calc): 138 mg/dL (calc) — ABNORMAL HIGH (ref <130)
Total CHOL/HDL Ratio: 4.1 (calc) (ref <5.0)
Triglycerides: 298 mg/dL — ABNORMAL HIGH (ref <150)

## 2020-12-09 LAB — CBC WITH DIFFERENTIAL/PLATELET
Absolute Monocytes: 902 cells/uL (ref 200–950)
Basophils Absolute: 38 cells/uL (ref 0–200)
Basophils Relative: 0.4 %
Eosinophils Absolute: 720 cells/uL — ABNORMAL HIGH (ref 15–500)
Eosinophils Relative: 7.5 %
HCT: 41.7 % (ref 38.5–50.0)
Hemoglobin: 14.2 g/dL (ref 13.2–17.1)
Lymphs Abs: 1546 cells/uL (ref 850–3900)
MCH: 30 pg (ref 27.0–33.0)
MCHC: 34.1 g/dL (ref 32.0–36.0)
MCV: 88.2 fL (ref 80.0–100.0)
MPV: 9.7 fL (ref 7.5–12.5)
Monocytes Relative: 9.4 %
Neutro Abs: 6394 cells/uL (ref 1500–7800)
Neutrophils Relative %: 66.6 %
Platelets: 293 10*3/uL (ref 140–400)
RBC: 4.73 10*6/uL (ref 4.20–5.80)
RDW: 13.8 % (ref 11.0–15.0)
Total Lymphocyte: 16.1 %
WBC: 9.6 10*3/uL (ref 3.8–10.8)

## 2020-12-09 LAB — COMPLETE METABOLIC PANEL WITH GFR
AG Ratio: 2 (calc) (ref 1.0–2.5)
ALT: 32 U/L (ref 9–46)
AST: 24 U/L (ref 10–40)
Albumin: 4.8 g/dL (ref 3.6–5.1)
Alkaline phosphatase (APISO): 96 U/L (ref 36–130)
BUN: 12 mg/dL (ref 7–25)
CO2: 27 mmol/L (ref 20–32)
Calcium: 9.9 mg/dL (ref 8.6–10.3)
Chloride: 104 mmol/L (ref 98–110)
Creat: 0.8 mg/dL (ref 0.60–1.35)
GFR, Est African American: 122 mL/min/{1.73_m2} (ref >=60)
GFR, Est Non African American: 106 mL/min/{1.73_m2} (ref >=60)
Globulin: 2.4 g/dL (calc) (ref 1.9–3.7)
Glucose, Bld: 116 mg/dL — ABNORMAL HIGH (ref 65–99)
Potassium: 3.9 mmol/L (ref 3.5–5.3)
Sodium: 141 mmol/L (ref 135–146)
Total Bilirubin: 0.3 mg/dL (ref 0.2–1.2)
Total Protein: 7.2 g/dL (ref 6.1–8.1)

## 2020-12-09 LAB — HEMOGLOBIN A1C
Hgb A1c MFr Bld: 6.1 % of total Hgb — ABNORMAL HIGH (ref <5.7)
Mean Plasma Glucose: 128 mg/dL
eAG (mmol/L): 7.1 mmol/L

## 2020-12-09 LAB — MICROALBUMIN, URINE: Microalb, Ur: 2.2 mg/dL

## 2020-12-14 ENCOUNTER — Other Ambulatory Visit: Payer: Self-pay | Admitting: Family Medicine

## 2020-12-14 DIAGNOSIS — M545 Low back pain, unspecified: Secondary | ICD-10-CM

## 2020-12-22 ENCOUNTER — Other Ambulatory Visit: Payer: Self-pay | Admitting: Family Medicine

## 2020-12-22 DIAGNOSIS — M545 Low back pain, unspecified: Secondary | ICD-10-CM

## 2021-01-25 DIAGNOSIS — Z961 Presence of intraocular lens: Secondary | ICD-10-CM | POA: Diagnosis not present

## 2021-01-25 DIAGNOSIS — L2089 Other atopic dermatitis: Secondary | ICD-10-CM | POA: Diagnosis not present

## 2021-01-25 DIAGNOSIS — H2512 Age-related nuclear cataract, left eye: Secondary | ICD-10-CM | POA: Diagnosis not present

## 2021-01-25 DIAGNOSIS — H18611 Keratoconus, stable, right eye: Secondary | ICD-10-CM | POA: Diagnosis not present

## 2021-01-25 DIAGNOSIS — H04123 Dry eye syndrome of bilateral lacrimal glands: Secondary | ICD-10-CM | POA: Diagnosis not present

## 2021-01-25 DIAGNOSIS — H1711 Central corneal opacity, right eye: Secondary | ICD-10-CM | POA: Diagnosis not present

## 2021-01-25 LAB — HM DIABETES EYE EXAM

## 2021-02-08 ENCOUNTER — Other Ambulatory Visit: Payer: Self-pay | Admitting: *Deleted

## 2021-02-08 DIAGNOSIS — M545 Low back pain, unspecified: Secondary | ICD-10-CM

## 2021-02-08 MED ORDER — HYDROCODONE-ACETAMINOPHEN 5-325 MG PO TABS: 1.0000 | ORAL_TABLET | Freq: Four times a day (QID) | ORAL | 0 refills | Status: DC | PRN

## 2021-02-08 NOTE — Telephone Encounter (Signed)
Received call from patient.   Requested refill on Hydrocodone/APAP.  Ok to refill??  Last office visit 12/08/2020.  Last refill 11/26/2020.

## 2021-02-23 ENCOUNTER — Other Ambulatory Visit: Payer: Self-pay | Admitting: *Deleted

## 2021-02-23 DIAGNOSIS — M545 Low back pain, unspecified: Secondary | ICD-10-CM

## 2021-02-23 NOTE — Telephone Encounter (Signed)
Received fax requesting refill on Zanaflex.   Ok to refill? 

## 2021-02-25 MED ORDER — TIZANIDINE HCL 4 MG PO TABS: ORAL_TABLET | ORAL | 1 refills | Status: DC

## 2021-03-04 DIAGNOSIS — L2089 Other atopic dermatitis: Secondary | ICD-10-CM | POA: Diagnosis not present

## 2021-03-04 DIAGNOSIS — Z79899 Other long term (current) drug therapy: Secondary | ICD-10-CM | POA: Diagnosis not present

## 2021-03-09 ENCOUNTER — Other Ambulatory Visit: Payer: Self-pay

## 2021-03-09 ENCOUNTER — Ambulatory Visit (INDEPENDENT_AMBULATORY_CARE_PROVIDER_SITE_OTHER): Payer: Medicare Other | Admitting: Family Medicine

## 2021-03-09 ENCOUNTER — Encounter: Payer: Self-pay | Admitting: Family Medicine

## 2021-03-09 VITALS — BP 128/70 | HR 82 | Temp 98.0°F | Resp 16 | Ht 66.0 in | Wt 203.0 lb

## 2021-03-09 DIAGNOSIS — E118 Type 2 diabetes mellitus with unspecified complications: Secondary | ICD-10-CM

## 2021-03-09 DIAGNOSIS — I10 Essential (primary) hypertension: Secondary | ICD-10-CM

## 2021-03-09 NOTE — Progress Notes (Signed)
Subjective:    Patient ID: Adam Lozano, male    DOB: Feb 08, 1971, 49 y.o.   MRN: [id number]  Patient is a very pleasant 50 year old Caucasian male here today for a checkup.  He has a history of eczema along with asthma for which he is on methotrexate due to the severity of the conditions. This is managed by his dermatologist.  He is slowly trying to wean off the methotrexate.  He also uses a combination of Elavil and Westcort creams to help manage his eczema.  He is using meloxicam sparingly for back pain along with tizanidine.  He also has diabetes mellitus 2. HgA1c was 6.1 in 2/22.   Patient's blood pressure today is well controlled.  He denies any chest pain shortness of breath or dyspnea on exertion blood pressure today is 128/70.  He is trying to manage his diabetes with diet.  He states that he has given up dairy foods.  We discussed carbohydrates today.  I discussed with the patient that carbohydrates are primarily breads and potatoes and rice and starchy food along with sweets and sugars.  He admits that he drinks a lot of sweet tea.  I encouraged the patient to try to avoid sweet teas and sodas and drink more water.  He has been having more issues with his lower back.  He takes tizanidine quite regularly.  He does complain of some dry mouth and constipation on the tizanidine but otherwise is doing relatively well.  He uses meloxicam sparingly to try to avoid any irritation to his kidneys.  However I encouraged the patient to liberalize his use of the meloxicam.  I do not feel that taking it 3 days a week would be a problem for the patient as long as he drinks plenty of fluid.  Also believe that it would help his pain in his back. Past Medical History:  Diagnosis Date  . Asthma   . Atopic dermatitis   . GERD (gastroesophageal reflux disease)   . Gout   . Low back pain    Chronic  . RLS (restless legs syndrome)    Past Surgical History:  Procedure Laterality Date  . EYE SURGERY   02/24/14/15   cataracts rt eye   Current Outpatient Medications on File Prior to Visit  Medication Sig Dispense Refill  . amLODipine (NORVASC) 10 MG tablet Take 1 tablet (10 mg total) by mouth daily. Stop losartan 90 tablet 3  . Artificial Tear Ointment (ARTIFICIAL TEARS) ointment Place 1 application into both eyes daily.     Marland Kitchen BEPREVE 1.5 % SOLN Place 1 drop into both eyes 2 (two) times daily.  11  . budesonide-formoterol (SYMBICORT) 160-4.5 MCG/ACT inhaler INHALE 2 PUFFS INTO THE LUNGS TWICE A DAY 10.2 each 6  . carboxymethylcellulose (REFRESH PLUS) 0.5 % SOLN Place 1 drop into both eyes 2 (two) times daily.     . cetirizine (ZYRTEC) 10 MG tablet Take 10 mg by mouth daily.    . clobetasol cream (TEMOVATE) 6.29 % Apply 1 application topically 2 (two) times daily.     . fish oil-omega-3 fatty acids 1000 MG capsule Take 1 g by mouth daily.     . folic acid (FOLVITE) 1 MG tablet Take 1 mg by mouth daily.  8  . HYDROcodone-acetaminophen (NORCO/VICODIN) 5-325 MG tablet Take 1 tablet by mouth every 6 (six) hours as needed for moderate pain. 30 tablet 0  . hydrocortisone valerate cream (WESTCORT) 0.2 % Apply 1 application topically daily.     Marland Kitchen  meloxicam (MOBIC) 7.5 MG tablet Take 1 tablet (7.5 mg total) by mouth daily. 30 tablet 3  . methotrexate (RHEUMATREX) 2.5 MG tablet Take 10 mg by mouth once a week. Caution:Chemotherapy. Protect from light. Take on sundays Pt takes 4 pills every Sunday to equal 10 mg    . montelukast (SINGULAIR) 10 MG tablet Take 1 tablet (10 mg total) by mouth at bedtime. (Patient not taking: Reported on 12/08/2020) 90 tablet 3  . pimecrolimus (ELIDEL) 1 % cream Apply topically 2 (two) times daily.    . prednisoLONE acetate (PRED FORTE) 1 % ophthalmic suspension Place 1 drop into both eyes daily.  0  . tiZANidine (ZANAFLEX) 4 MG tablet TAKE 1 TABLET BY MOUTH EVERY 6 HOURS AS NEEDED FOR MUSCLE SPASM 60 tablet 1   No current facility-administered medications on file prior to  visit.   Allergies  Allergen Reactions  . Coconut Oil Hives and Itching    MAKES THROAT ITCH, ALSO  . Other Hives and Itching    NO TREE NUTS  . Peanut-Containing Drug Products Hives and Itching    MAKES THROAT ITCH, ALSO  . Latex Rash   Social History   Socioeconomic History  . Marital status: Single    Spouse name: Not on file  . Number of children: Not on file  . Years of education: Not on file  . Highest education level: Not on file  Occupational History  . Not on file  Tobacco Use  . Smoking status: Former Smoker    Quit date: 11/25/2011    Years since quitting: 9.2  . Smokeless tobacco: Never Used  Substance and Sexual Activity  . Alcohol use: No  . Drug use: Not on file  . Sexual activity: Not on file  Other Topics Concern  . Not on file  Social History Narrative  . Not on file   Social Determinants of Health   Financial Resource Strain: Not on file  Food Insecurity: Not on file  Transportation Needs: Not on file  Physical Activity: Not on file  Stress: Not on file  Social Connections: Not on file  Intimate Partner Violence: Not on file      Review of Systems  Musculoskeletal: Positive for back pain.  All other systems reviewed and are negative.      Objective:   Physical Exam Vitals reviewed.  Constitutional:      General: He is not in acute distress.    Appearance: Normal appearance. He is obese. He is not ill-appearing.  Cardiovascular:     Rate and Rhythm: Normal rate and regular rhythm.     Pulses: Normal pulses.     Heart sounds: Normal heart sounds. No murmur heard. No friction rub. No gallop.   Pulmonary:     Effort: Pulmonary effort is normal. No respiratory distress.     Breath sounds: Normal breath sounds. No stridor. No wheezing, rhonchi or rales.  Chest:     Chest wall: No tenderness.  Abdominal:     General: Bowel sounds are normal.     Palpations: Abdomen is soft.     Tenderness: There is no abdominal tenderness. There is  no guarding or rebound.  Musculoskeletal:     Right lower leg: No edema.     Left lower leg: No edema.  Skin:    Findings: Rash present.  Neurological:     Mental Status: He is alert.           Assessment & Plan:  Benign essential HTN  Controlled type 2 diabetes mellitus with complication, without long-term current use of insulin ([place]) - Plan: COMPLETE METABOLIC PANEL WITH GFR, Lipid panel, Microalbumin, urine, Hemoglobin A1c   Repeat his hemoglobin A1c.  He is currently a diet-controlled diabetic.  Strongly recommended low carbohydrate diet avoiding bread, potatoes, rice, and pasta.  Recommended he avoid drinking sugar such as sweet tea and soda.  Continue to work on weight loss.  Check hemoglobin A1c.  If less than 6.5, I think we can space his office visits to every 6 months.  I would consider adding medication such as metformin if greater than 7.  Also check a urine microalbumin and a lipid panel.  Goal LDL cholesterol is less than 100.  Despite being a borderline diabetic, the patient is not on a statin at this time.  This is been primarily out of my concern over polypharmacy particular given the fact he is on methotrexate.  However if LDL cholesterol rises above 100 I would recommend a statin.  If his hemoglobin A1c is greater than 6.5 I would also recommend a statin.

## 2021-03-10 LAB — COMPLETE METABOLIC PANEL WITH GFR
AG Ratio: 1.8 (calc) (ref 1.0–2.5)
ALT: 22 U/L (ref 9–46)
AST: 17 U/L (ref 10–40)
Albumin: 4.3 g/dL (ref 3.6–5.1)
Alkaline phosphatase (APISO): 80 U/L (ref 36–130)
BUN: 12 mg/dL (ref 7–25)
CO2: 28 mmol/L (ref 20–32)
Calcium: 9.9 mg/dL (ref 8.6–10.3)
Chloride: 103 mmol/L (ref 98–110)
Creat: 0.85 mg/dL (ref 0.60–1.35)
GFR, Est African American: 119 mL/min/{1.73_m2} (ref >=60)
GFR, Est Non African American: 103 mL/min/{1.73_m2} (ref >=60)
Globulin: 2.4 g/dL (calc) (ref 1.9–3.7)
Glucose, Bld: 135 mg/dL — ABNORMAL HIGH (ref 65–99)
Potassium: 3.9 mmol/L (ref 3.5–5.3)
Sodium: 141 mmol/L (ref 135–146)
Total Bilirubin: 0.2 mg/dL (ref 0.2–1.2)
Total Protein: 6.7 g/dL (ref 6.1–8.1)

## 2021-03-10 LAB — LIPID PANEL
Cholesterol: 158 mg/dL (ref <200)
HDL: 35 mg/dL — ABNORMAL LOW (ref >=40)
Non-HDL Cholesterol (Calc): 123 mg/dL (calc) (ref <130)
Total CHOL/HDL Ratio: 4.5 (calc) (ref <5.0)
Triglycerides: 560 mg/dL — ABNORMAL HIGH (ref <150)

## 2021-03-10 LAB — MICROALBUMIN, URINE: Microalb, Ur: 0.4 mg/dL

## 2021-03-10 LAB — HEMOGLOBIN A1C
Hgb A1c MFr Bld: 5.9 % of total Hgb — ABNORMAL HIGH (ref <5.7)
Mean Plasma Glucose: 123 mg/dL
eAG (mmol/L): 6.8 mmol/L

## 2021-03-12 ENCOUNTER — Other Ambulatory Visit: Payer: Self-pay | Admitting: Family Medicine

## 2021-03-12 ENCOUNTER — Encounter: Payer: Self-pay | Admitting: *Deleted

## 2021-03-12 ENCOUNTER — Other Ambulatory Visit: Payer: Self-pay | Admitting: *Deleted

## 2021-03-12 DIAGNOSIS — M545 Low back pain, unspecified: Secondary | ICD-10-CM

## 2021-03-12 MED ORDER — FENOFIBRATE 160 MG PO TABS: 160.0000 mg | ORAL_TABLET | Freq: Every day | ORAL | 1 refills | Status: DC

## 2021-03-12 NOTE — Telephone Encounter (Signed)
Ok to refill 

## 2021-03-17 ENCOUNTER — Other Ambulatory Visit: Payer: Self-pay | Admitting: *Deleted

## 2021-03-17 DIAGNOSIS — M545 Low back pain, unspecified: Secondary | ICD-10-CM

## 2021-03-17 NOTE — Telephone Encounter (Signed)
Received call from patient.   Requested refill on Hydrocodone/APAP.  Ok to refill??  Last office visit 03/09/2021.  Last refill 02/08/2021.

## 2021-03-18 MED ORDER — HYDROCODONE-ACETAMINOPHEN 5-325 MG PO TABS: 1.0000 | ORAL_TABLET | Freq: Four times a day (QID) | ORAL | 0 refills | Status: DC | PRN

## 2021-04-02 ENCOUNTER — Other Ambulatory Visit: Payer: Self-pay | Admitting: *Deleted

## 2021-04-02 DIAGNOSIS — M545 Low back pain, unspecified: Secondary | ICD-10-CM

## 2021-04-02 MED ORDER — TIZANIDINE HCL 4 MG PO TABS: 4.0000 mg | ORAL_TABLET | Freq: Four times a day (QID) | ORAL | 1 refills | Status: DC | PRN

## 2021-04-02 NOTE — Telephone Encounter (Signed)
Received fax requesting refill on Zanaflex.   Ok to refill? 

## 2021-04-18 ENCOUNTER — Other Ambulatory Visit: Payer: Self-pay | Admitting: Family Medicine

## 2021-04-19 ENCOUNTER — Other Ambulatory Visit: Payer: Self-pay | Admitting: *Deleted

## 2021-04-19 DIAGNOSIS — M545 Low back pain, unspecified: Secondary | ICD-10-CM

## 2021-04-19 NOTE — Telephone Encounter (Signed)
Received call from patient.   Requested refill on Hydrocodone/APAP.   Ok to refill??  Last office visit 03/09/2021.  Last refill 03/18/2021.

## 2021-04-20 MED ORDER — HYDROCODONE-ACETAMINOPHEN 5-325 MG PO TABS: 1.0000 | ORAL_TABLET | Freq: Four times a day (QID) | ORAL | 0 refills | Status: DC | PRN

## 2021-04-20 NOTE — Telephone Encounter (Signed)
PDMP reviewed.  Appears patient takes this medication chronically.  Refill given in place of PCP who is out of office.

## 2021-05-01 ENCOUNTER — Other Ambulatory Visit: Payer: Self-pay | Admitting: Family Medicine

## 2021-05-09 ENCOUNTER — Other Ambulatory Visit: Payer: Self-pay | Admitting: Family Medicine

## 2021-05-09 DIAGNOSIS — M545 Low back pain, unspecified: Secondary | ICD-10-CM

## 2021-05-18 ENCOUNTER — Telehealth: Payer: Self-pay | Admitting: *Deleted

## 2021-05-18 NOTE — Chronic Care Management (AMB) (Signed)
  Chronic Care Management   Outreach Note  05/18/2021 Name: Adam Lozano MRN: [id number] DOB: Apr 25, 1971  Adam Lozano is a 50 y.o. year old male who is a primary care patient of Susy Frizzle, MD. I reached out to Adam Lozano by phone today in response to a referral sent by Adam Lozano PCP, Dr. Dennard Schaumann      An unsuccessful telephone outreach was attempted today. The patient was referred to the case management team for assistance with care management and care coordination.   Follow Up Plan: A HIPAA compliant phone message was left for the patient providing contact information and requesting a return call. The care management team will reach out to the patient again over the next 7 days.  If patient returns call to provider office, please advise to call [place] at [phone].  [facility]  Direct Dial: [phone]

## 2021-05-19 NOTE — Chronic Care Management (AMB) (Signed)
  Chronic Care Management   Note  05/19/2021 Name: MELQUIADES KOVAR MRN: [id number] DOB: Jun 19, 1971  KEISUKE HOLLABAUGH is a 50 y.o. year old male who is a primary care patient of Susy Frizzle, MD. I reached out to Lawrence Marseilles by phone today in response to a referral sent by Mr. VALERIO PINARD PCP, Dr. Dennard Schaumann.      Mr. Denbow was given information about Chronic Care Management services today including:  CCM service includes personalized support from designated clinical staff supervised by his physician, including individualized plan of care and coordination with other care providers 24/7 contact phone numbers for assistance for urgent and routine care needs. Service will only be billed when office clinical staff spend 20 minutes or more in a month to coordinate care. Only one practitioner may furnish and bill the service in a calendar month. The patient may stop CCM services at any time (effective at the end of the month) by phone call to the office staff. The patient will be responsible for cost sharing (co-pay) of up to 20% of the service fee (after annual deductible is met).  Patient agreed to services and verbal consent obtained.   Follow up plan: Telephone appointment with care management team member scheduled for:06/15/21  [facility]  Direct Dial: [phone]

## 2021-05-26 ENCOUNTER — Other Ambulatory Visit: Payer: Self-pay | Admitting: Family Medicine

## 2021-05-26 DIAGNOSIS — M545 Low back pain, unspecified: Secondary | ICD-10-CM

## 2021-05-26 NOTE — Telephone Encounter (Signed)
Ok to refill 

## 2021-06-09 DIAGNOSIS — Z79899 Other long term (current) drug therapy: Secondary | ICD-10-CM | POA: Diagnosis not present

## 2021-06-09 DIAGNOSIS — L2089 Other atopic dermatitis: Secondary | ICD-10-CM | POA: Diagnosis not present

## 2021-06-14 ENCOUNTER — Ambulatory Visit (INDEPENDENT_AMBULATORY_CARE_PROVIDER_SITE_OTHER): Payer: Medicare Other | Admitting: *Deleted

## 2021-06-14 DIAGNOSIS — E118 Type 2 diabetes mellitus with unspecified complications: Secondary | ICD-10-CM | POA: Diagnosis not present

## 2021-06-14 DIAGNOSIS — I10 Essential (primary) hypertension: Secondary | ICD-10-CM

## 2021-06-14 NOTE — Patient Instructions (Signed)
Visit Information   PATIENT GOALS:   Goals Addressed             This Visit's Progress    Monitor and Manage My Blood Sugar-Diabetes Type 2       Timeframe:  Long-Range Goal Priority:  Medium Start Date:      06/14/2021                       Expected End Date:      12/15/2021                 Follow Up Date  08/02/2021   - check blood sugar at prescribed times - check blood sugar if I feel it is too high or too low - enter blood sugar readings and medication or insulin into daily log - take the blood sugar log to all doctor visits - take the blood sugar meter to all doctor visits  - take all medications as prescribed - please look over information sent via My Chart- diabetes diet and hypoglycemia - expect a call from care guide for resources regarding possible mold in your home   Why is this important?   Checking your blood sugar at home helps to keep it from getting very high or very low.  Writing the results in a diary or log helps the doctor know how to care for you.  Your blood sugar log should have the time, date and the results.  Also, write down the amount of insulin or other medicine that you take.  Other information, like what you ate, exercise done and how you were feeling, will also be helpful.     Notes:      Track and Manage My Blood Pressure-Hypertension       Timeframe:  Long-Range Goal Priority:  Medium Start Date:        06/14/2021                     Expected End Date:   12/15/2021                    Follow Up Date 08/02/2021   - check blood pressure 3 times per week - write blood pressure results in a log or diary  - follow low sodium diet - look over education sent via My Chart- low sodium diet - please talk to your doctor about any questions about taking fenofibrate   Why is this important?   You won't feel high blood pressure, but it can still hurt your blood vessels.  High blood pressure can cause heart or kidney problems. It can also cause a  stroke.  Making lifestyle changes like losing a little weight or eating less salt will help.  Checking your blood pressure at home and at different times of the day can help to control blood pressure.  If the doctor prescribes medicine remember to take it the way the doctor ordered.  Call the office if you cannot afford the medicine or if there are questions about it.     Notes:         Consent to CCM Services: Mr. Rappaport was given information about Chronic Care Management services including:  CCM service includes personalized support from designated clinical staff supervised by his physician, including individualized plan of care and coordination with other care providers 24/7 contact phone numbers for assistance for urgent and routine care needs. Service will only be billed  when office clinical staff spend 20 minutes or more in a month to coordinate care. Only one practitioner may furnish and bill the service in a calendar month. The patient may stop CCM services at any time (effective at the end of the month) by phone call to the office staff. The patient will be responsible for cost sharing (co-pay) of up to 20% of the service fee (after annual deductible is met).  Patient agreed to services and verbal consent obtained.   Patient verbalizes understanding of instructions provided today and agrees to view in [place].   Telephone follow up appointment with care management team member scheduled for:  10/10/2022Diabetes Mellitus and Nutrition, Adult When you have diabetes, or diabetes mellitus, it is very important to have healthy eating habits because your blood sugar (glucose) levels are greatly affected by what you eat and drink. Eating healthy foods in the right amounts, at about the same times every day, can help you: Control your blood glucose. Lower your risk of heart disease. Improve your blood pressure. Reach or maintain a healthy weight. What can affect my meal plan? Every  person with diabetes is different, and each person has different needs for a meal plan. Your health care provider may recommend that you work with a dietitian to make a meal plan that is best for you. Your meal plan may vary depending on factors such as: The calories you need. The medicines you take. Your weight. Your blood glucose, blood pressure, and cholesterol levels. Your activity level. Other health conditions you have, such as heart or kidney disease. How do carbohydrates affect me? Carbohydrates, also called carbs, affect your blood glucose level more than any other type of food. Eating carbs naturally raises the amount of glucose in your blood. Carb counting is a method for keeping track of how many carbs you eat. Counting carbs is important to keep your blood glucose at a healthy level,especially if you use insulin or take certain oral diabetes medicines. It is important to know how many carbs you can safely have in each meal. This is different for every person. Your dietitian can help you calculate how manycarbs you should have at each meal and for each snack. How does alcohol affect me? Alcohol can cause a sudden decrease in blood glucose (hypoglycemia), especially if you use insulin or take certain oral diabetes medicines. Hypoglycemia can be a life-threatening condition. Symptoms of hypoglycemia, such as sleepiness, dizziness, and confusion, are similar to symptoms of having too much alcohol. Do not drink alcohol if: Your health care provider tells you not to drink. You are pregnant, may be pregnant, or are planning to become pregnant. If you drink alcohol: Do not drink on an empty stomach. Limit how much you use to: 0-1 drink a day for women. 0-2 drinks a day for men. Be aware of how much alcohol is in your drink. In the [place] one drink equals one 12 oz bottle of beer (355 mL), one 5 oz glass of wine (148 mL), or one 1 oz glass of hard liquor (44 mL). Keep yourself hydrated with  water, diet soda, or unsweetened iced tea. Keep in mind that regular soda, juice, and other mixers may contain a lot of sugar and must be counted as carbs. What are tips for following this plan?  Reading food labels Start by checking the serving size on the "Nutrition Facts" label of packaged foods and drinks. The amount of calories, carbs, fats, and other nutrients listed on the label  is based on one serving of the item. Many items contain more than one serving per package. Check the total grams (g) of carbs in one serving. You can calculate the number of servings of carbs in one serving by dividing the total carbs by 15. For example, if a food has 30 g of total carbs per serving, it would be equal to 2 servings of carbs. Check the number of grams (g) of saturated fats and trans fats in one serving. Choose foods that have a low amount or none of these fats. Check the number of milligrams (mg) of salt (sodium) in one serving. Most people should limit total sodium intake to less than 2,300 mg per day. Always check the nutrition information of foods labeled as "low-fat" or "nonfat." These foods may be higher in added sugar or refined carbs and should be avoided. Talk to your dietitian to identify your daily goals for nutrients listed on the label. Shopping Avoid buying canned, pre-made, or processed foods. These foods tend to be high in fat, sodium, and added sugar. Shop around the outside edge of the grocery store. This is where you will most often find fresh fruits and vegetables, bulk grains, fresh meats, and fresh dairy. Cooking Use low-heat cooking methods, such as baking, instead of high-heat cooking methods like deep frying. Cook using healthy oils, such as olive, canola, or sunflower oil. Avoid cooking with butter, cream, or high-fat meats. Meal planning Eat meals and snacks regularly, preferably at the same times every day. Avoid going long periods of time without eating. Eat foods that  are high in fiber, such as fresh fruits, vegetables, beans, and whole grains. Talk with your dietitian about how many servings of carbs you can eat at each meal. Eat 4-6 oz (112-168 g) of lean protein each day, such as lean meat, chicken, fish, eggs, or tofu. One ounce (oz) of lean protein is equal to: 1 oz (28 g) of meat, chicken, or fish. 1 egg.  cup (62 g) of tofu. Eat some foods each day that contain healthy fats, such as avocado, nuts, seeds, and fish. What foods should I eat? Fruits Berries. Apples. Oranges. Peaches. Apricots. Plums. Grapes. Mango. Papaya.Pomegranate. Kiwi. Cherries. Vegetables Lettuce. Spinach. Leafy greens, including kale, chard, collard greens, and mustard greens. Beets. Cauliflower. Cabbage. Broccoli. Carrots. Green beans.Tomatoes. Peppers. Onions. Cucumbers. Brussels sprouts. Grains Whole grains, such as whole-wheat or whole-grain bread, crackers, tortillas,cereal, and pasta. Unsweetened oatmeal. Quinoa. Brown or wild rice. Meats and other proteins Seafood. Poultry without skin. Lean cuts of poultry and beef. Tofu. Nuts. Seeds. Dairy Low-fat or fat-free dairy products such as milk, yogurt, and cheese. The items listed above may not be a complete list of foods and beverages you can eat. Contact a dietitian for more information. What foods should I avoid? Fruits Fruits canned with syrup. Vegetables Canned vegetables. Frozen vegetables with butter or cream sauce. Grains Refined white flour and flour products such as bread, pasta, snack foods, andcereals. Avoid all processed foods. Meats and other proteins Fatty cuts of meat. Poultry with skin. Breaded or fried meats. Processed meat.Avoid saturated fats. Dairy Full-fat yogurt, cheese, or milk. Beverages Sweetened drinks, such as soda or iced tea. The items listed above may not be a complete list of foods and beverages you should avoid. Contact a dietitian for more information. Questions to ask a health care  provider Do I need to meet with a diabetes educator? Do I need to meet with a dietitian? What number can I  call if I have questions? When are the best times to check my blood glucose? Where to find more information: American Diabetes Association: diabetes.org Academy of Nutrition and Dietetics: www.eatright.[employer] of Diabetes and Digestive and Kidney Diseases: [place] Association of Diabetes Care and Education Specialists: www.diabeteseducator.org Summary It is important to have healthy eating habits because your blood sugar (glucose) levels are greatly affected by what you eat and drink. A healthy meal plan will help you control your blood glucose and maintain a healthy lifestyle. Your health care provider may recommend that you work with a dietitian to make a meal plan that is best for you. Keep in mind that carbohydrates (carbs) and alcohol have immediate effects on your blood glucose levels. It is important to count carbs and to use alcohol carefully. This information is not intended to replace advice given to you by your health care provider. Make sure you discuss any questions you have with your healthcare provider. Document Revised: 09/17/2019 Document Reviewed: 09/17/2019 Elsevier Patient Education  2021 [place]. Hypoglycemia Hypoglycemia is when the sugar (glucose) level in your blood is too low. Low blood sugar can happen to people who have diabetes and people who do not have diabetes. Low blood sugar can happenquickly, and it can be an emergency. What are the causes? This condition happens most often in people who have diabetes. It may be caused by: Diabetes medicine. Not eating enough, or not eating often enough. Doing more physical activity. Drinking alcohol on an empty stomach. If you do not have diabetes, this condition may be caused by: A tumor in the pancreas. Not eating enough, or not eating for long periods at a time (fasting). A very  bad infection or illness. Problems after having weight loss (bariatric) surgery. Kidney failure or liver failure. Certain medicines. What increases the risk? This condition is more likely to develop in people who: Have diabetes and take medicines to lower their blood sugar. Abuse alcohol. Have a very bad illness. What are the signs or symptoms? Mild Hunger. Sweating and feeling clammy. Feeling dizzy or light-headed. Being sleepy or having trouble sleeping. Feeling like you may vomit (nauseous). A fast heartbeat. A headache. Blurry vision. Mood changes, such as: Being grouchy. Feeling worried or nervous (anxious). Tingling or loss of feeling (numbness) around your mouth, lips, or tongue. Moderate Confusion and poor judgment. Behavior changes. Weakness. Uneven heartbeat. Trouble with moving (coordination). Very low Very low blood sugar (severe hypoglycemia) is a medical emergency. It can cause: Fainting. Seizures. Loss of consciousness (coma). Death. How is this treated? Treating low blood sugar Low blood sugar is often treated by eating or drinking something that has sugar in it right away. The food or drink should contain 15 grams of a fast-acting carb (carbohydrate). Options include: 4 oz (120 mL) of fruit juice. 4 oz (120 mL) of regular soda (not diet soda). A few pieces of hard candy. Check food labels to see how many pieces to eat for 15 grams. 1 Tbsp (15 mL) of sugar or honey. 4 glucose tablets. 1 tube of glucose gel. Treating low blood sugar if you have diabetes If you can think clearly and swallow safely, follow the 15:15 rule: Take 15 grams of a fast-acting carb. Talk with your doctor about how much you should take. Always keep a source of fast-acting carb with you, such as: Glucose tablets (take 4 tablets). A few pieces of hard candy. Check food labels to see how many pieces to eat for 15 grams.  4 oz (120 mL) of fruit juice. 4 oz (120 mL) of regular soda  (not diet soda). 1 Tbsp (15 mL) of honey or sugar. 1 tube of glucose gel. Check your blood sugar 15 minutes after you take the carb. If your blood sugar is still at or below 70 mg/dL (3.9 mmol/L), take 15 grams of a carb again. If your blood sugar does not go above 70 mg/dL (3.9 mmol/L) after 3 tries, get help right away. After your blood sugar goes back to normal, eat a meal or a snack within 1 hour.  Treating very low blood sugar If your blood sugar is below 54 mg/dL (3 mmol/L), you have very low blood sugar, or severe hypoglycemia. This is an emergency. Get medical help right away. If you have very low blood sugar and you cannot eat or drink, you will need to be given a hormone called glucagon. A family member or friend should learn how to check your blood sugar and how to give you glucagon. Ask your doctor if youneed to have an emergency glucagon kit at home. Very low blood sugar may also need to be treated in a hospital. Follow these instructions at home: General instructions Take over-the-counter and prescription medicines only as told by your doctor. Stay aware of your blood sugar as told by your doctor. If you drink alcohol: Limit how much you have to: 0-1 drink a day for women who are not pregnant. 0-2 drinks a day for men. Know how much alcohol is in your drink. In the [place] one drink equals one 12 oz bottle of beer (355 mL), one 5 oz glass of wine (148 mL), or one 1 oz glass of hard liquor (44 mL). Be sure to eat food when you drink alcohol. Know that your body absorbs alcohol quickly. This may lead to low blood sugar later. Be sure to keep checking your blood sugar. Keep all follow-up visits. If you have diabetes:  Always have a fast-acting carb (15 grams) with you to treat low blood sugar. Follow your diabetes care plan as told by your doctor. Make sure you: Know the symptoms of low blood sugar. Check your blood sugar as often as told. Always check it before and after  exercise. Always check your blood sugar before you drive. Take your medicines as told. Follow your meal plan. Eat on time. Do not skip meals. Share your diabetes care plan with: Your work or school. People you live with. Carry a card or wear jewelry that says you have diabetes.  Where to find more information American Diabetes Association: www.diabetes.org Contact a doctor if: You have trouble keeping your blood sugar in your target range. You have low blood sugar often. Get help right away if: You still have symptoms after you eat or drink something that contains 15 grams of fast-acting carb, and you cannot get your blood sugar above 70 mg/dL by following the 15:15 rule. Your blood sugar is below 54 mg/dL (3 mmol/L). You have a seizure. You faint. These symptoms may be an emergency. Get help right away. Call your local emergency services (911 in the [place] Do not wait to see if the symptoms will go away. Do not drive yourself to the hospital. Summary Hypoglycemia happens when the level of sugar (glucose) in your blood is too low. Low blood sugar can happen to people who have diabetes and people who do not have diabetes. Low blood sugar can happen quickly, and it can be an emergency. Make  sure you know the symptoms of low blood sugar and know how to treat it. Always keep a source of sugar (fast-acting carb) with you to treat low blood sugar. This information is not intended to replace advice given to you by your health care provider. Make sure you discuss any questions you have with your healthcare provider. Document Revised: 09/10/2020 Document Reviewed: 09/10/2020 Elsevier Patient Education  2022 [place]. Low-Sodium Eating Plan Sodium, which is an element that makes up salt, helps you maintain a healthy balance of fluids in your body. Too much sodium can increase your bloodpressure and cause fluid and waste to be held in your body. Your health care provider or dietitian may  recommend following this plan if you have high blood pressure (hypertension), kidney disease, liver disease, or heart failure. Eating less sodium can help lower your blood pressure, reduce swelling, and protect your heart, liver, andkidneys. What are tips for following this plan? Reading food labels The Nutrition Facts label lists the amount of sodium in one serving of the food. If you eat more than one serving, you must multiply the listed amount of sodium by the number of servings. Choose foods with less than 140 mg of sodium per serving. Avoid foods with 300 mg of sodium or more per serving. Shopping  Look for lower-sodium products, often labeled as "low-sodium" or "no salt added." Always check the sodium content, even if foods are labeled as "unsalted" or "no salt added." Buy fresh foods. Avoid canned foods and pre-made or frozen meals. Avoid canned, cured, or processed meats. Buy breads that have less than 80 mg of sodium per slice.  Cooking  Eat more home-cooked food and less restaurant, buffet, and fast food. Avoid adding salt when cooking. Use salt-free seasonings or herbs instead of table salt or sea salt. Check with your health care provider or pharmacist before using salt substitutes. Cook with plant-based oils, such as canola, sunflower, or olive oil.  Meal planning When eating at a restaurant, ask that your food be prepared with less salt or no salt, if possible. Avoid dishes labeled as brined, pickled, cured, smoked, or made with soy sauce, miso, or teriyaki sauce. Avoid foods that contain MSG (monosodium glutamate). MSG is sometimes added to [place] food, bouillon, and some canned foods. Make meals that can be grilled, baked, poached, roasted, or steamed. These are generally made with less sodium. General information Most people on this plan should limit their sodium intake to 1,500-2,000 mg (milligrams) of sodium each day. What foods should I eat? Fruits Fresh, frozen, or  canned fruit. Fruit juice. Vegetables Fresh or frozen vegetables. "No salt added" canned vegetables. "No salt added"tomato sauce and paste. Low-sodium or reduced-sodium tomato and vegetable juice. Grains Low-sodium cereals, including oats, puffed wheat and rice, and shredded wheat. Low-sodium crackers. Unsalted rice. Unsalted pasta. Low-sodium bread.Whole-grain breads and whole-grain pasta. Meats and other proteins Fresh or frozen (no salt added) meat, poultry, seafood, and fish. Low-sodium canned tuna and salmon. Unsalted nuts. Dried peas, beans, and lentils withoutadded salt. Unsalted canned beans. Eggs. Unsalted nut butters. Dairy Milk. Soy milk. Cheese that is naturally low in sodium, such as ricotta cheese, fresh mozzarella, or Swiss cheese. Low-sodium or reduced-sodium cheese. Creamcheese. Yogurt. Seasonings and condiments Fresh and dried herbs and spices. Salt-free seasonings. Low-sodium mustard and ketchup. Sodium-free salad dressing. Sodium-free light mayonnaise. Fresh orrefrigerated horseradish. Lemon juice. Vinegar. Other foods Homemade, reduced-sodium, or low-sodium soups. Unsalted popcorn and pretzels.Low-salt or salt-free chips. The items listed above may  not be a complete list of foods and beverages you can eat. Contact a dietitian for more information. What foods should I avoid? Vegetables Sauerkraut, pickled vegetables, and relishes. Olives. [place] fries. Onion rings. Regular canned vegetables (not low-sodium or reduced-sodium). Regular canned tomato sauce and paste (not low-sodium or reduced-sodium). Regular tomato and vegetable juice (not low-sodium or reduced-sodium). Frozenvegetables in sauces. Grains Instant hot cereals. Bread stuffing, pancake, and biscuit mixes. Croutons. Seasoned rice or pasta mixes. Noodle soup cups. Boxed or frozen macaroni andcheese. Regular salted crackers. Self-rising flour. Meats and other proteins Meat or fish that is salted, canned, smoked,  spiced, or pickled. Precooked or cured meat, such as sausages or meat loaves. Berniece Salines. Ham. Pepperoni. Hot dogs. Corned beef. Chipped beef. Salt pork. Jerky. Pickled herring. Anchovies andsardines. Regular canned tuna. Salted nuts. Dairy Processed cheese and cheese spreads. Hard cheeses. Cheese curds. Blue cheese.Feta cheese. String cheese. Regular cottage cheese. Buttermilk. Canned milk. Fats and oils Salted butter. Regular margarine. Ghee. Bacon fat. Seasonings and condiments Onion salt, garlic salt, seasoned salt, table salt, and sea salt. Canned and packaged gravies. Worcestershire sauce. Tartar sauce. Barbecue sauce. Teriyaki sauce. Soy sauce, including reduced-sodium. Steak sauce. Fish sauce. Oyster sauce. Cocktail sauce. Horseradish that you find on the shelf. Regular ketchup and mustard. Meat flavorings and tenderizers. Bouillon cubes. Hot sauce. Pre-made or packaged marinades. Pre-made or packaged taco seasonings. Relishes.Regular salad dressings. Salsa. Other foods Salted popcorn and pretzels. Corn chips and puffs. Potato and tortilla chips.Canned or dried soups. Pizza. Frozen entrees and pot pies. The items listed above may not be a complete list of foods and beverages you should avoid. Contact a dietitian for more information. Summary Eating less sodium can help lower your blood pressure, reduce swelling, and protect your heart, liver, and kidneys. Most people on this plan should limit their sodium intake to 1,500-2,000 mg (milligrams) of sodium each day. Canned, boxed, and frozen foods are high in sodium. Restaurant foods, fast foods, and pizza are also very high in sodium. You also get sodium by adding salt to food. Try to cook at home, eat more fresh fruits and vegetables, and eat less fast food and canned, processed, or prepared foods. This information is not intended to replace advice given to you by your health care provider. Make sure you discuss any questions you have with your  healthcare provider. Document Revised: 11/15/2019 Document Reviewed: 09/11/2019 Elsevier Patient Education  2022 [facility], BSN RN Case Manager [facility] [phone]   CLINICAL CARE PLAN: Patient Care Plan: Diabetes Type 2 (Adult)     Problem Identified: Glycemic Management (Diabetes, Type 2)   Priority: Medium     Long-Range Goal: Glycemic Management Optimized   Start Date: 06/14/2021  Expected End Date: 12/15/2021  This Visit's Progress: On track  Priority: Medium  Note:   Objective:  Lab Results  Component Value Date   HGBA1C 5.9 (H) 03/09/2021   Lab Results  Component Value Date   CREATININE 0.85 03/09/2021   CREATININE 0.80 12/08/2020   CREATININE 0.80 06/05/2020   No results found for: EGFR Current Barriers:  Knowledge Deficits related to basic Diabetes pathophysiology and self care/management- needs reinforcement ADA / Carbohydrate modified diet.  Patient repots he lives alone, has brother and mother that lives about 15 minutes away if he needed assistance.  Patient reports he checks CBG "on occasion" and was not instructed to check CBG, pt is not on medication for diabetes.  Patient reports when  he checks CBG readings are 130-170's range.  Patient reports he may have some mold growth in his home due to previous water leak, pt has asthma that is well controlled at present, does request any resources for mold prevention, eradication. Case Manager Clinical Goal(s):  patient will demonstrate improved adherence to prescribed treatment plan for diabetes self care/management as evidenced by: adherence to ADA/ carb modified diet adherence to prescribed medication regimen contacting provider for new or worsened symptoms or questions Interventions:  Collaboration with Susy Frizzle, MD regarding development and update of comprehensive plan of care as evidenced by provider attestation and co-signature Inter-disciplinary care team  collaboration (see longitudinal plan of care) Provided education to patient about basic DM disease process Reviewed medications with patient and discussed importance of medication adherence Discussed plans with patient for ongoing care management follow up and provided patient with direct contact information for care management team Provided patient with written educational materials related to hypo and hyperglycemia and importance of correct treatment (My Chart) Education provided diabetes diet (My Chart) Reviewed scheduled/upcoming provider appointments including: 11/17 primary care provider Review of patient status, including review of consultants reports, relevant laboratory and other test results, and medications completed. Reviewed carbohydrate modified diet Reviewed importance of exercise Referral sent to Care Guide for any resources related to possible mold growth in patient's home Self-Care Activities  Attends all scheduled provider appointments Checks blood sugars as prescribed and utilize hyper and hypoglycemia protocol as needed Adheres to prescribed ADA/carb modified Patient Goals: - check blood sugar at prescribed times - check blood sugar if I feel it is too high or too low - enter blood sugar readings and medication or insulin into daily log - take the blood sugar log to all doctor visits - take the blood sugar meter to all doctor visits  - take all medications as prescribed - please look over information sent via My Chart- diabetes diet and hypoglycemia - expect a call from care guide for resources regarding possible mold in your home - drink 6 to 8 glasses of water each day - read food labels for fat, fiber, carbohydrates and portion size - schedule appointment with eye doctor - check feet daily for cuts, sores or redness - wash and dry feet carefully every day - wear comfortable, cotton socks - wear comfortable, well-fitting shoes Follow Up Plan: Telephone follow up  appointment with care management team member scheduled for:   08/02/2021    Patient Care Plan: Hypertension (Adult)     Problem Identified: Hypertension (Hypertension)   Priority: Medium     Long-Range Goal: Hypertension Monitored   Start Date: 06/14/2021  Expected End Date: 12/15/2021  This Visit's Progress: On track  Priority: Medium  Note:   Objective:  Last practice recorded BP readings:  BP Readings from Last 3 Encounters:  03/09/21 128/70  12/08/20 (!) 152/88  06/05/20 (!) 152/92   Most recent eGFR/CrCl: No results found for: EGFR  No components found for: CRCL Current Barriers:  Knowledge Deficits related to basic understanding of hypertension pathophysiology and self care management- patient reports he has blood pressure cuff and checks blood pressure "only on occasion"  Patient reports he walks at times and likes to get out and play music sometimes on the weekends, lives alone and has brother and mother that he can depend on.  Patient reports he rarely eats fast food. Medication profile shows fenofibrate and patient reports he is not taking this medication and plans to talk with his primary  care provider with some questions he has, then will decide whether he wants to take. Patient reports he does not have advanced directives and not interested in completing. Case Manager Clinical Goal(s):  patient will verbalize understanding of plan for hypertension management patient will attend all scheduled medical appointments patient will demonstrate improved adherence to prescribed treatment plan for hypertension as evidenced by taking all medications as prescribed, monitoring and recording blood pressure as directed, adhering to low sodium/DASH diet patient will verbalize basic understanding of hypertension disease process and self health management plan as evidenced by well controlled blood pressure Interventions:  Collaboration with Susy Frizzle, MD regarding development and  update of comprehensive plan of care as evidenced by provider attestation and co-signature Inter-disciplinary care team collaboration (see longitudinal plan of care) Evaluation of current treatment plan related to hypertension self management and patient's adherence to plan as established by provider. Provided education to patient re: stroke prevention, s/s of heart attack and stroke, DASH diet, complications of uncontrolled blood pressure Reviewed medications with patient and discussed importance of compliance Discussed plans with patient for ongoing care management follow up and provided patient with direct contact information for care management team Advised patient, providing education and rationale, to monitor blood pressure 3 x per week and record, calling PCP for findings outside established parameters.  Reviewed scheduled/upcoming provider appointments including: 11/17 primary care provider Encouraged patient to speak with primary care provider about taking fenofibrate Self-Care Activities: Self administers medications as prescribed Attends all scheduled provider appointments Calls provider office for new concerns, questions, or BP outside discussed parameters Checks BP and records as discussed Follows a low sodium diet/DASH diet Patient Goals: - check blood pressure 3 times per week - write blood pressure results in a log or diary  - follow low sodium diet - look over education sent via My Chart- low sodium diet - please talk to your doctor about any questions about taking fenofibrate Follow Up Plan: Telephone follow up appointment with care management team member scheduled for:   08/02/2021

## 2021-06-14 NOTE — Chronic Care Management (AMB) (Signed)
Chronic Care Management   CCM RN Visit Note  06/14/2021 Name: Adam Lozano MRN: [id number] DOB: 04-13-1971  Subjective: Adam Lozano is a 50 y.o. year old male who is a primary care patient of Pickard, Cammie Mcgee, MD. The care management team was consulted for assistance with disease management and care coordination needs.    Engaged with patient by telephone for initial visit in response to provider referral for case management and/or care coordination services.   Consent to Services:  The patient was given the following information about Chronic Care Management services today, agreed to services, and gave verbal consent: 1. CCM service includes personalized support from designated clinical staff supervised by the primary care provider, including individualized plan of care and coordination with other care providers 2. 24/7 contact phone numbers for assistance for urgent and routine care needs. 3. Service will only be billed when office clinical staff spend 20 minutes or more in a month to coordinate care. 4. Only one practitioner may furnish and bill the service in a calendar month. 5.The patient may stop CCM services at any time (effective at the end of the month) by phone call to the office staff. 6. The patient will be responsible for cost sharing (co-pay) of up to 20% of the service fee (after annual deductible is met). Patient agreed to services and consent obtained.  Patient agreed to services and verbal consent obtained.   Assessment: Review of patient past medical history, allergies, medications, health status, including review of consultants reports, laboratory and other test data, was performed as part of comprehensive evaluation and provision of chronic care management services.   SDOH (Social Determinants of Health) assessments and interventions performed:  SDOH Interventions    Flowsheet Row Most Recent Value  SDOH Interventions   Food Insecurity Interventions Intervention  Not Indicated  Transportation Interventions Intervention Not Indicated        CCM Care Plan  Allergies  Allergen Reactions   Coconut Oil Hives and Itching    MAKES THROAT ITCH, ALSO   Other Hives and Itching    NO TREE NUTS   Peanut-Containing Drug Products Hives and Itching    MAKES THROAT ITCH, ALSO   Latex Rash    Outpatient Encounter Medications as of 06/14/2021  Medication Sig Note   amLODipine (NORVASC) 10 MG tablet TAKE 1 TABLET (10 MG TOTAL) BY MOUTH DAILY. STOP LOSARTAN    Artificial Tear Ointment (ARTIFICIAL TEARS) ointment Place 1 application into both eyes daily.     BEPREVE 1.5 % SOLN Place 1 drop into both eyes 2 (two) times daily.    budesonide-formoterol (SYMBICORT) 160-4.5 MCG/ACT inhaler INHALE 2 PUFFS INTO THE LUNGS TWICE A DAY    carboxymethylcellulose (REFRESH PLUS) 0.5 % SOLN Place 1 drop into both eyes 2 (two) times daily.     cetirizine (ZYRTEC) 10 MG tablet Take 10 mg by mouth daily.    fish oil-omega-3 fatty acids 1000 MG capsule Take 1 g by mouth daily.     folic acid (FOLVITE) 1 MG tablet Take 1 mg by mouth daily.    HYDROcodone-acetaminophen (NORCO/VICODIN) 5-325 MG tablet Take 1 tablet by mouth every 6 (six) hours as needed for moderate pain.    meloxicam (MOBIC) 7.5 MG tablet TAKE 1 TABLET BY MOUTH EVERY DAY    methotrexate (RHEUMATREX) 2.5 MG tablet Take 12.5 mg by mouth once a week. Caution:Chemotherapy. Protect from light. Take on sundays Pt takes 4 pills every Sunday to equal 10 mg 04/01/2016: SUNDAYS  pimecrolimus (ELIDEL) 1 % cream Apply topically 2 (two) times daily.    tiZANidine (ZANAFLEX) 4 MG tablet TAKE 1 TABLET BY MOUTH EVERY 6 HOURS AS NEEDED FOR MUSCLE SPASM    clobetasol cream (TEMOVATE) 7.47 % Apply 1 application topically 2 (two) times daily.  (Patient not taking: Reported on 06/14/2021)    fenofibrate 160 MG tablet Take 1 tablet (160 mg total) by mouth daily. (Patient not taking: Reported on 06/14/2021)    hydrocortisone valerate  cream (WESTCORT) 0.2 % Apply 1 application topically daily.  (Patient not taking: Reported on 06/14/2021)    montelukast (SINGULAIR) 10 MG tablet Take 1 tablet (10 mg total) by mouth at bedtime. (Patient not taking: No sig reported)    No facility-administered encounter medications on file as of 06/14/2021.    Patient Active Problem List   Diagnosis Date Noted   Gout 10/21/2015   Hyperglycemia 10/01/2014   GLUCOSE INTOLERANCE 06/04/2010   DEPRESSION 05/21/2010   RESTLESS LEGS SYNDROME 05/21/2010   INSOMNIA 06/11/2008   ANXIETY DISORDER, GENERALIZED 02/24/2007   BLEPHARITIS NOS 02/24/2007   Asthma 02/24/2007   DERMATITIS, OTHER ATOPIC 02/24/2007   Bilateral low back pain without sciatica 02/24/2007   PNEUMONIA, HX OF 02/24/2007    Conditions to be addressed/monitored:HTN and DMII  Care Plan : Diabetes Type 2 (Adult)  Updates made by Kassie Mends, RN since 06/14/2021 12:00 AM     Problem: Glycemic Management (Diabetes, Type 2)   Priority: Medium     Long-Range Goal: Glycemic Management Optimized   Start Date: 06/14/2021  Expected End Date: 12/15/2021  This Visit's Progress: On track  Priority: Medium  Note:   Objective:  Lab Results  Component Value Date   HGBA1C 5.9 (H) 03/09/2021   Lab Results  Component Value Date   CREATININE 0.85 03/09/2021   CREATININE 0.80 12/08/2020   CREATININE 0.80 06/05/2020   No results found for: EGFR Current Barriers:  Knowledge Deficits related to basic Diabetes pathophysiology and self care/management- needs reinforcement ADA / Carbohydrate modified diet.  Patient repots he lives alone, has brother and mother that lives about 15 minutes away if he needed assistance.  Patient reports he checks CBG "on occasion" and was not instructed to check CBG, pt is not on medication for diabetes.  Patient reports when he checks CBG readings are 130-170's range.  Patient reports he may have some mold growth in his home due to previous water leak, pt  has asthma that is well controlled at present, does request any resources for mold prevention, eradication. Case Manager Clinical Goal(s):  patient will demonstrate improved adherence to prescribed treatment plan for diabetes self care/management as evidenced by: adherence to ADA/ carb modified diet adherence to prescribed medication regimen contacting provider for new or worsened symptoms or questions Interventions:  Collaboration with Susy Frizzle, MD regarding development and update of comprehensive plan of care as evidenced by provider attestation and co-signature Inter-disciplinary care team collaboration (see longitudinal plan of care) Provided education to patient about basic DM disease process Reviewed medications with patient and discussed importance of medication adherence Discussed plans with patient for ongoing care management follow up and provided patient with direct contact information for care management team Provided patient with written educational materials related to hypo and hyperglycemia and importance of correct treatment (My Chart) Education provided diabetes diet (My Chart) Reviewed scheduled/upcoming provider appointments including: 11/17 primary care provider Review of patient status, including review of consultants reports, relevant laboratory and other test results, and  medications completed. Reviewed carbohydrate modified diet Reviewed importance of exercise Referral sent to Care Guide for any resources related to possible mold growth in patient's home Self-Care Activities  Attends all scheduled provider appointments Checks blood sugars as prescribed and utilize hyper and hypoglycemia protocol as needed Adheres to prescribed ADA/carb modified Patient Goals: - check blood sugar at prescribed times - check blood sugar if I feel it is too high or too low - enter blood sugar readings and medication or insulin into daily log - take the blood sugar log to all  doctor visits - take the blood sugar meter to all doctor visits  - take all medications as prescribed - please look over information sent via My Chart- diabetes diet and hypoglycemia - expect a call from care guide for resources regarding possible mold in your home - drink 6 to 8 glasses of water each day - read food labels for fat, fiber, carbohydrates and portion size - schedule appointment with eye doctor - check feet daily for cuts, sores or redness - wash and dry feet carefully every day - wear comfortable, cotton socks - wear comfortable, well-fitting shoes Follow Up Plan: Telephone follow up appointment with care management team member scheduled for:   08/02/2021    Care Plan : Hypertension (Adult)  Updates made by Kassie Mends, RN since 06/14/2021 12:00 AM     Problem: Hypertension (Hypertension)   Priority: Medium     Long-Range Goal: Hypertension Monitored   Start Date: 06/14/2021  Expected End Date: 12/15/2021  This Visit's Progress: On track  Priority: Medium  Note:   Objective:  Last practice recorded BP readings:  BP Readings from Last 3 Encounters:  03/09/21 128/70  12/08/20 (!) 152/88  06/05/20 (!) 152/92   Most recent eGFR/CrCl: No results found for: EGFR  No components found for: CRCL Current Barriers:  Knowledge Deficits related to basic understanding of hypertension pathophysiology and self care management- patient reports he has blood pressure cuff and checks blood pressure "only on occasion"  Patient reports he walks at times and likes to get out and play music sometimes on the weekends, lives alone and has brother and mother that he can depend on.  Patient reports he rarely eats fast food. Medication profile shows fenofibrate and patient reports he is not taking this medication and plans to talk with his primary care provider with some questions he has, then will decide whether he wants to take. Patient reports he does not have advanced directives and not  interested in completing. Case Manager Clinical Goal(s):  patient will verbalize understanding of plan for hypertension management patient will attend all scheduled medical appointments patient will demonstrate improved adherence to prescribed treatment plan for hypertension as evidenced by taking all medications as prescribed, monitoring and recording blood pressure as directed, adhering to low sodium/DASH diet patient will verbalize basic understanding of hypertension disease process and self health management plan as evidenced by well controlled blood pressure Interventions:  Collaboration with Susy Frizzle, MD regarding development and update of comprehensive plan of care as evidenced by provider attestation and co-signature Inter-disciplinary care team collaboration (see longitudinal plan of care) Evaluation of current treatment plan related to hypertension self management and patient's adherence to plan as established by provider. Provided education to patient re: stroke prevention, s/s of heart attack and stroke, DASH diet, complications of uncontrolled blood pressure Reviewed medications with patient and discussed importance of compliance Discussed plans with patient for ongoing care management follow up and  provided patient with direct contact information for care management team Advised patient, providing education and rationale, to monitor blood pressure 3 x per week and record, calling PCP for findings outside established parameters.  Reviewed scheduled/upcoming provider appointments including: 11/17 primary care provider Encouraged patient to speak with primary care provider about taking fenofibrate Self-Care Activities: Self administers medications as prescribed Attends all scheduled provider appointments Calls provider office for new concerns, questions, or BP outside discussed parameters Checks BP and records as discussed Follows a low sodium diet/DASH diet Patient  Goals: - check blood pressure 3 times per week - write blood pressure results in a log or diary  - follow low sodium diet - look over education sent via My Chart- low sodium diet - please talk to your doctor about any questions about taking fenofibrate Follow Up Plan: Telephone follow up appointment with care management team member scheduled for:   08/02/2021     Plan:Telephone follow up appointment with care management team member scheduled for:  08/02/2021  Jacqlyn Larsen [facility], BSN RN Case Manager [facility] [phone]

## 2021-06-18 ENCOUNTER — Telehealth: Payer: Self-pay | Admitting: *Deleted

## 2021-06-18 NOTE — Telephone Encounter (Signed)
   Telephone encounter was:  Unsuccessful.  06/18/2021 Name: Adam Lozano MRN: [id number] DOB: [birth year]  Unsuccessful outbound call made today to assist with:   1st Attempt outreach call. Voicemail was left for pt to return my call.  Outreach Attempt:  1st Attempt  A HIPAA compliant voice message was left requesting a return call.  Instructed patient to call back at   Instructed patient to call back at [phone]  at their earliest convenience. .  [place], Care Management  [phone] [address] , [place] [address] Email : [email or web address]

## 2021-06-21 ENCOUNTER — Other Ambulatory Visit: Payer: Self-pay | Admitting: *Deleted

## 2021-06-21 DIAGNOSIS — M545 Low back pain, unspecified: Secondary | ICD-10-CM

## 2021-06-21 NOTE — Telephone Encounter (Signed)
Received call from patient.   Requested refill on Hydrocodone/APAP.   Ok to refill??  Last office visit 03/09/2021.  Last refill 04/20/2021.

## 2021-06-22 MED ORDER — HYDROCODONE-ACETAMINOPHEN 5-325 MG PO TABS: 1.0000 | ORAL_TABLET | Freq: Four times a day (QID) | ORAL | 0 refills | Status: DC | PRN

## 2021-07-01 ENCOUNTER — Telehealth: Payer: Self-pay | Admitting: *Deleted

## 2021-07-01 NOTE — Telephone Encounter (Signed)
   Telephone encounter was:  Unsuccessful.  07/01/2021 Name: Adam Lozano MRN: [id number] DOB: [birth year]  Unsuccessful outbound call made today to assist with:  Transportation Needs  and Food Insecurity  Outreach Attempt:  2nd Attempt  A HIPAA compliant voice message was left requesting a return call.  Instructed patient to call back at   Instructed patient to call back at [phone]  at their earliest convenience.   [place], Care Management  [phone] [address] , [place] [address] Email : [email or web address]

## 2021-07-07 ENCOUNTER — Telehealth: Payer: Self-pay | Admitting: *Deleted

## 2021-07-07 NOTE — Telephone Encounter (Signed)
   Telephone encounter was:  Unsuccessful.  07/07/2021 Name: Adam Lozano MRN: [id number] DOB: 11/21/1970  Unsuccessful outbound call made today to assist with:   MOLD   Outreach Attempt:  3rd Attempt.  Referral closed unable to contact patient.  A HIPAA compliant voice message was left requesting a return call.  Instructed patient to call back at Instructed patient to call back at [phone]  at their earliest convenience.  Patient has been unsuccessfully out reached 3x and I will be happy to have further outreach if patient desires access to community resources in the future  [place] [facility] [place], Care Management  [phone] [address] , [place] [address] Email : [email or web address]

## 2021-07-22 ENCOUNTER — Other Ambulatory Visit: Payer: Self-pay

## 2021-07-22 ENCOUNTER — Ambulatory Visit (INDEPENDENT_AMBULATORY_CARE_PROVIDER_SITE_OTHER): Payer: Medicare Other

## 2021-07-22 VITALS — BP 128/72 | HR 64 | Ht 66.0 in | Wt 203.0 lb

## 2021-07-22 DIAGNOSIS — Z0000 Encounter for general adult medical examination without abnormal findings: Secondary | ICD-10-CM

## 2021-07-22 NOTE — Progress Notes (Signed)
Subjective:   Adam Lozano is a 50 y.o. male who presents for an Initial Medicare Annual Wellness Visit.  Review of Systems     Cardiac Risk Factors include: advanced age (>33men, >53 women);hypertension;obesity (BMI >30kg/m2);sedentary lifestyle;male gender     Objective:    Today's Vitals   07/22/21 0853 07/22/21 0858  BP: 128/72   Pulse: 64   Weight: 203 lb (92.1 kg)   Height: 5\' 6"  (1.676 m)   PainSc:  2    Body mass index is 32.77 kg/m.  Advanced Directives 07/22/2021 06/14/2021 04/01/2016 06/28/2014  Does Patient Have a Medical Advance Directive? - No No No  Would patient like information on creating a medical advance directive? No - Patient declined No - Patient declined - No - patient declined information    Current Medications (verified) Outpatient Encounter Medications as of 07/22/2021  Medication Sig   amLODipine (NORVASC) 10 MG tablet TAKE 1 TABLET (10 MG TOTAL) BY MOUTH DAILY. STOP LOSARTAN   Artificial Tear Ointment (ARTIFICIAL TEARS) ointment Place 1 application into both eyes daily.    BEPREVE 1.5 % SOLN Place 1 drop into both eyes 2 (two) times daily.   budesonide-formoterol (SYMBICORT) 160-4.5 MCG/ACT inhaler INHALE 2 PUFFS INTO THE LUNGS TWICE A DAY   carboxymethylcellulose (REFRESH PLUS) 0.5 % SOLN Place 1 drop into both eyes 2 (two) times daily.    cetirizine (ZYRTEC) 10 MG tablet Take 10 mg by mouth daily.   fish oil-omega-3 fatty acids 1000 MG capsule Take 1 g by mouth daily.    folic acid (FOLVITE) 1 MG tablet Take 1 mg by mouth daily.   HYDROcodone-acetaminophen (NORCO/VICODIN) 5-325 MG tablet Take 1 tablet by mouth every 6 (six) hours as needed for moderate pain.   hydrocortisone valerate cream (WESTCORT) 0.2 % Apply 1 application topically daily.   meloxicam (MOBIC) 7.5 MG tablet TAKE 1 TABLET BY MOUTH EVERY DAY   methotrexate (RHEUMATREX) 2.5 MG tablet Take 12.5 mg by mouth once a week. Caution:Chemotherapy. Protect from light. Take on sundays Pt  takes 4 pills every Sunday to equal 10 mg   pimecrolimus (ELIDEL) 1 % cream Apply topically 2 (two) times daily.   tiZANidine (ZANAFLEX) 4 MG tablet TAKE 1 TABLET BY MOUTH EVERY 6 HOURS AS NEEDED FOR MUSCLE SPASM   clobetasol cream (TEMOVATE) 6.43 % Apply 1 application topically 2 (two) times daily.  (Patient not taking: No sig reported)   fenofibrate 160 MG tablet Take 1 tablet (160 mg total) by mouth daily. (Patient not taking: No sig reported)   montelukast (SINGULAIR) 10 MG tablet Take 1 tablet (10 mg total) by mouth at bedtime. (Patient not taking: No sig reported)   No facility-administered encounter medications on file as of 07/22/2021.    Allergies (verified) Coconut oil, Other, Peanut-containing drug products, and Latex   History: Past Medical History:  Diagnosis Date   Asthma    Atopic dermatitis    Diabetes mellitus without complication (HCC)    borderline   GERD (gastroesophageal reflux disease)    Gout    Low back pain    Chronic   RLS (restless legs syndrome)    Past Surgical History:  Procedure Laterality Date   EYE SURGERY  02/24/14/15   cataracts rt eye   History reviewed. No pertinent family history. Social History   Socioeconomic History   Marital status: Single    Spouse name: Not on file   Number of children: 1   Years of education: Not on  file   Highest education level: Not on file  Occupational History   Occupation: Disability  Tobacco Use   Smoking status: Former    Types: Cigarettes    Quit date: 11/25/2011    Years since quitting: 9.6   Smokeless tobacco: Never   Tobacco comments:    07/22/21-Pt declines low dose CT scan.  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   Mom living. 1 Brother, 1 daughter.   Social Determinants of Health   Financial Resource Strain:  Low Risk    Difficulty of Paying Living Expenses: Not hard at all  Food Insecurity: No Food Insecurity   Worried About Charity fundraiser in the Last Year: Never true   [place] in the Last Year: Never true  Transportation Needs: No Transportation Needs   Lack of Transportation (Medical): No   Lack of Transportation (Non-Medical): No  Physical Activity: Insufficiently Active   Days of Exercise per Week: 3 days   Minutes of Exercise per Session: 20 min  Stress: No Stress Concern Present   Feeling of Stress : Not at all  Social Connections: Socially Isolated   Frequency of Communication with Friends and Family: More than three times a week   Frequency of Social Gatherings with Friends and Family: More than three times a week   Attends Religious Services: Never   Marine scientist or Organizations: No   Attends Archivist Meetings: Never   Marital Status: Never married    Tobacco Counseling Counseling given: Not Answered Tobacco comments: 07/22/21-Pt declines low dose CT scan.   Clinical Intake:  Pre-visit preparation completed: Yes  Pain : 0-10 Pain Score: 2  Pain Type: Chronic pain Pain Location: Back Pain Descriptors / Indicators: Aching Pain Onset: More than a month ago Pain Frequency: Intermittent     BMI - recorded: 32.77 Nutritional Status: BMI > 30  Obese Diabetes: No  How often do you need to have someone help you when you read instructions, pamphlets, or other written materials from your doctor or pharmacy?: 1 - Never  Diabetic? PRE-DIABETES PER PT.  Interpreter Needed?: No  Information entered by :: MJ Rogelio Waynick, LPN   Activities of Daily Living In your present state of health, do you have any difficulty performing the following activities: 07/22/2021  Hearing? N  Vision? N  Difficulty concentrating or making decisions? N  Walking or climbing stairs? N  Dressing or bathing? N  Doing errands, shopping? N  Preparing Food and eating  ? N  Using the Toilet? N  In the past six months, have you accidently leaked urine? N  Do you have problems with loss of bowel control? N  Managing your Medications? N  Managing your Finances? N  Housekeeping or managing your Housekeeping? N  Some recent data might be hidden    Patient Care Team: Susy Frizzle, MD as PCP - General (Family Medicine) Kassie Mends, RN as [place] any recent [place] you may have received from other than [facility] providers in the past year (date may be approximate).     Assessment:   This is a routine wellness examination for Bhavesh.  Hearing/Vision screen Hearing Screening - Comments:: No hearing issues. Vision Screening -  Comments:: Up to date on eye exam. Sees [facility].  Dietary issues and exercise activities discussed: Current Exercise Habits: Home exercise routine, Type of exercise: walking;strength training/weights, Time (Minutes): 20, Frequency (Times/Week): 3, Weekly Exercise (Minutes/Week): 60, Intensity: Mild, Exercise limited by: cardiac condition(s);orthopedic condition(s)   Goals Addressed             This Visit's Progress    Exercise 3x per week (30 min per time)       Increase exercise.       Depression Screen PHQ 2/9 Scores 07/22/2021 06/14/2021 06/14/2021 03/09/2021 05/16/2018 11/15/2017 05/03/2017  PHQ - 2 Score 0 0 0 0 0 2 0  PHQ- 9 Score - - - - - 4 -    Fall Risk Fall Risk  07/22/2021 06/14/2021 03/09/2021 04/21/2016  Falls in the past year? 0 0 0 No  Number falls in past yr: 0 - - -  Injury with Fall? 0 - - -  Risk for fall due to : No Fall Risks - No Fall Risks -  Follow up Falls prevention discussed - Falls evaluation completed -    FALL RISK PREVENTION PERTAINING TO THE HOME:  Any stairs in or around the home? No  If so, are there any without handrails? No  Home free of loose throw rugs in walkways, pet beds, electrical cords, etc? Yes  Adequate lighting in  your home to reduce risk of falls? Yes   ASSISTIVE DEVICES UTILIZED TO PREVENT FALLS:  Life alert? No  Use of a cane, walker or w/c? No  Grab bars in the bathroom? No  Shower chair or bench in shower? No  Elevated toilet seat or a handicapped toilet? No   TIMED UP AND GO:  Was the test performed? Yes .  Length of time to ambulate 10 feet: 7 sec.   Gait steady and fast without use of assistive device  Cognitive Function:     6CIT Screen 07/22/2021  What Year? 0 points  What month? 0 points  What time? 0 points  Count back from 20 0 points  Months in reverse 2 points  Repeat phrase 0 points  Total Score 2    Immunizations Immunization History  Administered Date(s) Administered   Influenza Inj Mdck Quad Pf 08/14/2017, 07/17/2018   Influenza Split 08/19/2011   Influenza Whole 08/22/2007, 07/24/2010, 08/22/2012   Influenza, Quadrivalent, Recombinant, Inj, Pf 08/30/2019   Influenza,inj,Quad PF,6+ Mos 08/26/2015   Influenza-Unspecified 08/05/2014, 08/03/2016, 08/14/2017   Pneumococcal Polysaccharide-23 04/13/2005, 11/30/2018   Td 04/13/2005   Tdap 10/21/2015    TDAP status: Up to date  Flu Vaccine status: Due, Education has been provided regarding the importance of this vaccine. Advised may receive this vaccine at local pharmacy or Health Dept. Aware to provide a copy of the vaccination record if obtained from local pharmacy or Health Dept. Verbalized acceptance and understanding.  Pneumococcal vaccine status: Up to date  Covid-19 vaccine status: Declined, Education has been provided regarding the importance of this vaccine but patient still declined. Advised may receive this vaccine at local pharmacy or Health Dept.or vaccine clinic. Aware to provide a copy of the vaccination record if obtained from local pharmacy or Health Dept. Verbalized acceptance and understanding.  Qualifies for Shingles Vaccine? No   Zostavax completed No   Shingrix Completed?: No.    Education  has been provided regarding the importance of this vaccine. Patient has been advised to call insurance company to determine out of pocket expense if they have  not yet received this vaccine. Advised may also receive vaccine at local pharmacy or Health Dept. Verbalized acceptance and understanding.  Screening Tests Health Maintenance  Topic Date Due   HIV Screening  Never done   Hepatitis C Screening  Never done   COLONOSCOPY (Pts 45-8yrs Insurance coverage will need to be confirmed)  Never done   INFLUENZA VACCINE  05/24/2021   URINE MICROALBUMIN  03/09/2022   TETANUS/TDAP  10/20/2025   HPV VACCINES  Aged Out   COVID-19 Vaccine  Discontinued    Health Maintenance  Health Maintenance Due  Topic Date Due   HIV Screening  Never done   Hepatitis C Screening  Never done   COLONOSCOPY (Pts 45-30yrs Insurance coverage will need to be confirmed)  Never done   INFLUENZA VACCINE  05/24/2021    Colorectal cancer screening: Referral to GI placed PT DECLINES AT THIS TIME. Pt aware the office will call re: appt.  Lung Cancer Screening: (Low Dose CT Chest recommended if Age 72-80 years, 30 pack-year currently smoking OR have quit w/in 15years.) does qualify.   Lung Cancer Screening Referral: DECLINED  Additional Screening:  Hepatitis C Screening: does not qualify  Vision Screening: Recommended annual ophthalmology exams for early detection of glaucoma and other disorders of the eye. Is the patient up to date with their annual eye exam?  Yes  Who is the provider or what is the name of the office in which the patient attends annual eye exams? [facility] If pt is not established with a provider, would they like to be referred to a provider to establish care? No .   Dental Screening: Recommended annual dental exams for proper oral hygiene  Community Resource Referral / Chronic Care Management: CRR required this visit?  No   CCM required this visit?  No      Plan:     I have  personally reviewed and noted the following in the patient's chart:   Medical and social history Use of alcohol, tobacco or illicit drugs  Current medications and supplements including opioid prescriptions. Patient is currently taking opioid prescriptions. Information provided to patient regarding non-opioid alternatives. Patient advised to discuss non-opioid treatment plan with their provider. Functional ability and status Nutritional status Physical activity Advanced directives List of other physicians Hospitalizations, surgeries, and ER visits in previous 12 months Vitals Screenings to include cognitive, depression, and falls Referrals and appointments  In addition, I have reviewed and discussed with patient certain preventive protocols, quality metrics, and best practice recommendations. A written personalized care plan for preventive services as well as general preventive health recommendations were provided to patient.     Chriss Driver, LPN   7/35/3299   Nurse Notes: Pt states he is doing well. Currently being followed by [facility]. Declined Colonoscopy at this time, states he will wait until age 33. Declined flu vaccine today. Declined low dose CT scan of lungs.

## 2021-07-22 NOTE — Patient Instructions (Signed)
Adam Lozano , Thank you for taking time to come for your Medicare Wellness Visit. I appreciate your ongoing commitment to your health goals. Please review the following plan we discussed and let me know if I can assist you in the future.   Screening recommendations/referrals: Colonoscopy: Do at age 50 Recommended yearly ophthalmology/optometry visit for glaucoma screening and checkup Recommended yearly dental visit for hygiene and checkup  Vaccinations: Influenza vaccine: Done 08/30/2019 Pneumococcal vaccine: 04/13/05 11/30/2018 Tdap vaccine: Done 10/21/2015 Shingles vaccine: Shingrix discussed. Please contact your pharmacy for coverage information.     Covid-19: Declined  Advanced directives: Advance directive discussed with you today. Even though you declined this today, please call our office should you change your mind, and we can give you the proper paperwork for you to fill out.   Conditions/risks identified: Aim for 30 minutes of exercise or brisk walking each day, drink 6-8 glasses of water and eat lots of fruits and vegetables.   Next appointment: Follow up in one year for your annual wellness visit 2023.  Preventive Care 40-64 Years, Male Preventive care refers to lifestyle choices and visits with your health care provider that can promote health and wellness. What does preventive care include? A yearly physical exam. This is also called an annual well check. Dental exams once or twice a year. Routine eye exams. Ask your health care provider how often you should have your eyes checked. Personal lifestyle choices, including: Daily care of your teeth and gums. Regular physical activity. Eating a healthy diet. Avoiding tobacco and drug use. Limiting alcohol use. Practicing safe sex. Taking low-dose aspirin every day starting at age 7. What happens during an annual well check? The services and screenings done by your health care provider during your annual well check will  depend on your age, overall health, lifestyle risk factors, and family history of disease. Counseling  Your health care provider may ask you questions about your: Alcohol use. Tobacco use. Drug use. Emotional well-being. Home and relationship well-being. Sexual activity. Eating habits. Work and work Statistician. Screening  You may have the following tests or measurements: Height, weight, and BMI. Blood pressure. Lipid and cholesterol levels. These may be checked every 5 years, or more frequently if you are over 60 years old. Skin check. Lung cancer screening. You may have this screening every year starting at age 47 if you have a 30-pack-year history of smoking and currently smoke or have quit within the past 15 years. Fecal occult blood test (FOBT) of the stool. You may have this test every year starting at age 26. Flexible sigmoidoscopy or colonoscopy. You may have a sigmoidoscopy every 5 years or a colonoscopy every 10 years starting at age 33. Prostate cancer screening. Recommendations will vary depending on your family history and other risks. Hepatitis C blood test. Hepatitis B blood test. Sexually transmitted disease (STD) testing. Diabetes screening. This is done by checking your blood sugar (glucose) after you have not eaten for a while (fasting). You may have this done every 1-3 years. Discuss your test results, treatment options, and if necessary, the need for more tests with your health care provider. Vaccines  Your health care provider may recommend certain vaccines, such as: Influenza vaccine. This is recommended every year. Tetanus, diphtheria, and acellular pertussis (Tdap, Td) vaccine. You may need a Td booster every 10 years. Zoster vaccine. You may need this after age 62. Pneumococcal 13-valent conjugate (PCV13) vaccine. You may need this if you have certain conditions and have  not been vaccinated. Pneumococcal polysaccharide (PPSV23) vaccine. You may need one or  two doses if you smoke cigarettes or if you have certain conditions. Talk to your health care provider about which screenings and vaccines you need and how often you need them. This information is not intended to replace advice given to you by your health care provider. Make sure you discuss any questions you have with your health care provider. Document Released: 11/06/2015 Document Revised: 06/29/2016 Document Reviewed: 08/11/2015 Elsevier Interactive Patient Education  2017 [place] Prevention in the Home Falls can cause injuries. They can happen to people of all ages. There are many things you can do to make your home safe and to help prevent falls. What can I do on the outside of my home? Regularly fix the edges of walkways and driveways and fix any cracks. Remove anything that might make you trip as you walk through a door, such as a raised step or threshold. Trim any bushes or trees on the path to your home. Use bright outdoor lighting. Clear any walking paths of anything that might make someone trip, such as rocks or tools. Regularly check to see if handrails are loose or broken. Make sure that both sides of any steps have handrails. Any raised decks and porches should have guardrails on the edges. Have any leaves, snow, or ice cleared regularly. Use sand or salt on walking paths during winter. Clean up any spills in your garage right away. This includes oil or grease spills. What can I do in the bathroom? Use night lights. Install grab bars by the toilet and in the tub and shower. Do not use towel bars as grab bars. Use non-skid mats or decals in the tub or shower. If you need to sit down in the shower, use a plastic, non-slip stool. Keep the floor dry. Clean up any water that spills on the floor as soon as it happens. Remove soap buildup in the tub or shower regularly. Attach bath mats securely with double-sided non-slip rug tape. Do not have throw rugs and other  things on the floor that can make you trip. What can I do in the bedroom? Use night lights. Make sure that you have a light by your bed that is easy to reach. Do not use any sheets or blankets that are too big for your bed. They should not hang down onto the floor. Have a firm chair that has side arms. You can use this for support while you get dressed. Do not have throw rugs and other things on the floor that can make you trip. What can I do in the kitchen? Clean up any spills right away. Avoid walking on wet floors. Keep items that you use a lot in easy-to-reach places. If you need to reach something above you, use a strong step stool that has a grab bar. Keep electrical cords out of the way. Do not use floor polish or wax that makes floors slippery. If you must use wax, use non-skid floor wax. Do not have throw rugs and other things on the floor that can make you trip. What can I do with my stairs? Do not leave any items on the stairs. Make sure that there are handrails on both sides of the stairs and use them. Fix handrails that are broken or loose. Make sure that handrails are as long as the stairways. Check any carpeting to make sure that it is firmly attached to the stairs. Fix  any carpet that is loose or worn. Avoid having throw rugs at the top or bottom of the stairs. If you do have throw rugs, attach them to the floor with carpet tape. Make sure that you have a light switch at the top of the stairs and the bottom of the stairs. If you do not have them, ask someone to add them for you. What else can I do to help prevent falls? Wear shoes that: Do not have high heels. Have rubber bottoms. Are comfortable and fit you well. Are closed at the toe. Do not wear sandals. If you use a stepladder: Make sure that it is fully opened. Do not climb a closed stepladder. Make sure that both sides of the stepladder are locked into place. Ask someone to hold it for you, if possible. Clearly  mark and make sure that you can see: Any grab bars or handrails. First and last steps. Where the edge of each step is. Use tools that help you move around (mobility aids) if they are needed. These include: Canes. Walkers. Scooters. Crutches. Turn on the lights when you go into a dark area. Replace any light bulbs as soon as they burn out. Set up your furniture so you have a clear path. Avoid moving your furniture around. If any of your floors are uneven, fix them. If there are any pets around you, be aware of where they are. Review your medicines with your doctor. Some medicines can make you feel dizzy. This can increase your chance of falling. Ask your doctor what other things that you can do to help prevent falls. This information is not intended to replace advice given to you by your health care provider. Make sure you discuss any questions you have with your health care provider. Document Released: 08/06/2009 Document Revised: 03/17/2016 Document Reviewed: 11/14/2014 Elsevier Interactive Patient Education  2017 [employer].

## 2021-08-02 ENCOUNTER — Ambulatory Visit (INDEPENDENT_AMBULATORY_CARE_PROVIDER_SITE_OTHER): Payer: Medicare Other | Admitting: *Deleted

## 2021-08-02 DIAGNOSIS — I10 Essential (primary) hypertension: Secondary | ICD-10-CM

## 2021-08-02 DIAGNOSIS — E118 Type 2 diabetes mellitus with unspecified complications: Secondary | ICD-10-CM

## 2021-08-02 NOTE — Patient Instructions (Addendum)
Visit Information  PATIENT GOALS:  Goals Addressed             This Visit's Progress    Monitor and Manage My Blood Sugar-Diabetes Type 2       Timeframe:  Long-Range Goal Priority:  Medium Start Date:      06/14/2021                       Expected End Date:      12/15/2021                 Follow Up Date  10/11/2021   - check blood sugar at prescribed times and enter into log - check blood sugar if I feel it is too high or too low - take the blood sugar log and glucometer to all doctor visits - take all medications as prescribed - please look over information sent via My Chart- Plate Method - please return call to care guide - call RN care manager for any questions at [phone]   Why is this important?   Checking your blood sugar at home helps to keep it from getting very high or very low.  Writing the results in a diary or log helps the doctor know how to care for you.  Your blood sugar log should have the time, date and the results.  Also, write down the amount of insulin or other medicine that you take.  Other information, like what you ate, exercise done and how you were feeling, will also be helpful.     Notes:      Track and Manage My Blood Pressure-Hypertension       Timeframe:  Long-Range Goal Priority:  Medium Start Date:        06/14/2021                     Expected End Date:   12/15/2021                    Follow Up Date 10/11/2021   - check blood pressure 3 times per week and record in log  - be mindful of sodium intake, - read labels - avoid salty snacks and fast food - please talk to your doctor about any questions about taking fenofibrate   Why is this important?   You won't feel high blood pressure, but it can still hurt your blood vessels.  High blood pressure can cause heart or kidney problems. It can also cause a stroke.  Making lifestyle changes like losing a little weight or eating less salt will help.  Checking your blood pressure at home and  at different times of the day can help to control blood pressure.  If the doctor prescribes medicine remember to take it the way the doctor ordered.  Call the office if you cannot afford the medicine or if there are questions about it.     Notes:         Patient verbalizes understanding of instructions provided today and agrees to view in Center.   Telephone follow up appointment with care management team member scheduled for:  10/11/2021  Carbohydrate Counting for Diabetes Mellitus, Adult Carbohydrate counting is a method of keeping track of how many carbohydrates you eat. Eating carbohydrates naturally increases the amount of sugar (glucose) in the blood. Counting how many carbohydrates you eat improves your blood glucose control, which helps you manage your diabetes. It is important to  know how many carbohydrates you can safely have in each meal. This is different for every person. A dietitian can help you make a meal plan and calculate how many carbohydrates you should have at each meal and snack. What foods contain carbohydrates? Carbohydrates are found in the following foods: Grains, such as breads and cereals. Dried beans and soy products. Starchy vegetables, such as potatoes, peas, and corn. Fruit and fruit juices. Milk and yogurt. Sweets and snack foods, such as cake, cookies, candy, chips, and soft drinks. How do I count carbohydrates in foods? There are two ways to count carbohydrates in food. You can read food labels or learn standard serving sizes of foods. You can use either of the methods or a combination of both. Using the Nutrition Facts label The Nutrition Facts list is included on the labels of almost all packaged foods and beverages in the [place] It includes: The serving size. Information about nutrients in each serving, including the grams (g) of carbohydrate per serving. To use the Nutrition Facts: Decide how many servings you will have. Multiply the number of  servings by the number of carbohydrates per serving. The resulting number is the total amount of carbohydrates that you will be having. Learning the standard serving sizes of foods When you eat carbohydrate foods that are not packaged or do not include Nutrition Facts on the label, you need to measure the servings in order to count the amount of carbohydrates. Measure the foods that you will eat with a food scale or measuring cup, if needed. Decide how many standard-size servings you will eat. Multiply the number of servings by 15. For foods that contain carbohydrates, one serving equals 15 g of carbohydrates. For example, if you eat 2 cups or 10 oz (300 g) of strawberries, you will have eaten 2 servings and 30 g of carbohydrates (2 servings x 15 g = 30 g). For foods that have more than one food mixed, such as soups and casseroles, you must count the carbohydrates in each food that is included. The following list contains standard serving sizes of common carbohydrate-rich foods. Each of these servings has about 15 g of carbohydrates: 1 slice of bread. 1 six-inch (15 cm) tortilla. ? cup or 2 oz (53 g) cooked rice or pasta.  cup or 3 oz (85 g) cooked or canned, drained and rinsed beans or lentils.  cup or 3 oz (85 g) starchy vegetable, such as peas, corn, or squash.  cup or 4 oz (120 g) hot cereal.  cup or 3 oz (85 g) boiled or mashed potatoes, or  or 3 oz (85 g) of a large baked potato.  cup or 4 fl oz (118 mL) fruit juice. 1 cup or 8 fl oz (237 mL) milk. 1 small or 4 oz (106 g) apple.  or 2 oz (63 g) of a medium banana. 1 cup or 5 oz (150 g) strawberries. 3 cups or 1 oz (24 g) popped popcorn. What is an example of carbohydrate counting? To calculate the number of carbohydrates in this sample meal, follow the steps shown below. Sample meal 3 oz (85 g) chicken breast. ? cup or 4 oz (106 g) brown rice.  cup or 3 oz (85 g) corn. 1 cup or 8 fl oz (237 mL) milk. 1 cup or 5 oz (150 g)  strawberries with sugar-free whipped topping. Carbohydrate calculation Identify the foods that contain carbohydrates: Rice. Corn. Milk. Strawberries. Calculate how many servings you have of each food: 2  servings rice. 1 serving corn. 1 serving milk. 1 serving strawberries. Multiply each number of servings by 15 g: 2 servings rice x 15 g = 30 g. 1 serving corn x 15 g = 15 g. 1 serving milk x 15 g = 15 g. 1 serving strawberries x 15 g = 15 g. Add together all of the amounts to find the total grams of carbohydrates eaten: 30 g + 15 g + 15 g + 15 g = 75 g of carbohydrates total. What are tips for following this plan? Shopping Develop a meal plan and then make a shopping list. Buy fresh and frozen vegetables, fresh and frozen fruit, dairy, eggs, beans, lentils, and whole grains. Look at food labels. Choose foods that have more fiber and less sugar. Avoid processed foods and foods with added sugars. Meal planning Aim to have the same amount of carbohydrates at each meal and for each snack time. Plan to have regular, balanced meals and snacks. Where to find more information American Diabetes Association: www.diabetes.org Centers for Disease Control and Prevention: http://www.wolf.info/ Summary Carbohydrate counting is a method of keeping track of how many carbohydrates you eat. Eating carbohydrates naturally increases the amount of sugar (glucose) in the blood. Counting how many carbohydrates you eat improves your blood glucose control, which helps you manage your diabetes. A dietitian can help you make a meal plan and calculate how many carbohydrates you should have at each meal and snack. This information is not intended to replace advice given to you by your health care provider. Make sure you discuss any questions you have with your health care provider. Document Revised: 10/10/2019 Document Reviewed: 10/11/2019 Elsevier Patient Education  2021 [place]   Jacqlyn Larsen [facility], BSN RN  Case Manager [facility] [phone]

## 2021-08-02 NOTE — Chronic Care Management (AMB) (Signed)
Chronic Care Management   CCM RN Visit Note  08/02/2021 Name: Adam Lozano MRN: [id number] DOB: 1971/09/30  Subjective: Adam Lozano is a 50 y.o. year old male who is a primary care patient of Pickard, Cammie Mcgee, MD. The care management team was consulted for assistance with disease management and care coordination needs.    Engaged with patient by telephone for follow up visit in response to provider referral for case management and/or care coordination services.   Consent to Services:  The patient was given information about Chronic Care Management services, agreed to services, and gave verbal consent prior to initiation of services.  Please see initial visit note for detailed documentation.   Patient agreed to services and verbal consent obtained.   Assessment: Review of patient past medical history, allergies, medications, health status, including review of consultants reports, laboratory and other test data, was performed as part of comprehensive evaluation and provision of chronic care management services.   SDOH (Social Determinants of Health) assessments and interventions performed:    CCM Care Plan  Allergies  Allergen Reactions   Coconut Oil Hives and Itching    MAKES THROAT ITCH, ALSO   Other Hives and Itching    NO TREE NUTS   Peanut-Containing Drug Products Hives and Itching    MAKES THROAT ITCH, ALSO   Latex Rash    Outpatient Encounter Medications as of 08/02/2021  Medication Sig Note   amLODipine (NORVASC) 10 MG tablet TAKE 1 TABLET (10 MG TOTAL) BY MOUTH DAILY. STOP LOSARTAN 07/22/2021: PRN    Artificial Tear Ointment (ARTIFICIAL TEARS) ointment Place 1 application into both eyes daily.     BEPREVE 1.5 % SOLN Place 1 drop into both eyes 2 (two) times daily.    budesonide-formoterol (SYMBICORT) 160-4.5 MCG/ACT inhaler INHALE 2 PUFFS INTO THE LUNGS TWICE A DAY    carboxymethylcellulose (REFRESH PLUS) 0.5 % SOLN Place 1 drop into both eyes 2 (two) times  daily.     cetirizine (ZYRTEC) 10 MG tablet Take 10 mg by mouth daily.    fish oil-omega-3 fatty acids 1000 MG capsule Take 1 g by mouth daily.     folic acid (FOLVITE) 1 MG tablet Take 1 mg by mouth daily.    HYDROcodone-acetaminophen (NORCO/VICODIN) 5-325 MG tablet Take 1 tablet by mouth every 6 (six) hours as needed for moderate pain.    hydrocortisone valerate cream (WESTCORT) 0.2 % Apply 1 application topically daily.    meloxicam (MOBIC) 7.5 MG tablet TAKE 1 TABLET BY MOUTH EVERY DAY    methotrexate (RHEUMATREX) 2.5 MG tablet Take 12.5 mg by mouth once a week. Caution:Chemotherapy. Protect from light. Take on sundays Pt takes 4 pills every Sunday to equal 10 mg 04/01/2016: SUNDAYS   pimecrolimus (ELIDEL) 1 % cream Apply topically 2 (two) times daily.    tiZANidine (ZANAFLEX) 4 MG tablet TAKE 1 TABLET BY MOUTH EVERY 6 HOURS AS NEEDED FOR MUSCLE SPASM    clobetasol cream (TEMOVATE) 3.41 % Apply 1 application topically 2 (two) times daily.  (Patient not taking: No sig reported)    fenofibrate 160 MG tablet Take 1 tablet (160 mg total) by mouth daily. (Patient not taking: No sig reported)    montelukast (SINGULAIR) 10 MG tablet Take 1 tablet (10 mg total) by mouth at bedtime. (Patient not taking: No sig reported)    No facility-administered encounter medications on file as of 08/02/2021.    Patient Active Problem List   Diagnosis Date Noted   Gout 10/21/2015  Hyperglycemia 10/01/2014   GLUCOSE INTOLERANCE 06/04/2010   DEPRESSION 05/21/2010   RESTLESS LEGS SYNDROME 05/21/2010   INSOMNIA 06/11/2008   ANXIETY DISORDER, GENERALIZED 02/24/2007   BLEPHARITIS NOS 02/24/2007   Asthma 02/24/2007   DERMATITIS, OTHER ATOPIC 02/24/2007   Bilateral low back pain without sciatica 02/24/2007   PNEUMONIA, HX OF 02/24/2007    Conditions to be addressed/monitored:HTN and DMII  Care Plan : Diabetes Type 2 (Adult)  Updates made by Kassie Mends, RN since 08/02/2021 12:00 AM     Problem:  Glycemic Management (Diabetes, Type 2)   Priority: Medium     Long-Range Goal: Glycemic Management Optimized   Start Date: 06/14/2021  Expected End Date: 12/15/2021  This Visit's Progress: On track  Recent Progress: On track  Priority: Medium  Note:   Objective:  Lab Results  Component Value Date   HGBA1C 5.9 (H) 03/09/2021   Lab Results  Component Value Date   CREATININE 0.85 03/09/2021   CREATININE 0.80 12/08/2020   CREATININE 0.80 06/05/2020   No results found for: EGFR Current Barriers:  Knowledge Deficits related to basic Diabetes pathophysiology and self care/management- needs reinforcement ADA / Carbohydrate modified diet.  Patient repots he lives alone, has brother and mother that lives about 15 minutes away if he needed assistance.  Patient reports he checks CBG "on occasion" and was not instructed to check CBG, pt is not on medication for diabetes, CBG today 105.  Patient reports he may have some mold growth in his home due to previous water leak, pt has asthma that is well controlled at present, does request any resources for mold prevention, eradication, care guide has outreached pt x 3 and left messages but has not spoken to patient, pt still interested in resources and will return call to care guide. Case Manager Clinical Goal(s):  patient will demonstrate improved adherence to prescribed treatment plan for diabetes self care/management as evidenced by: adherence to ADA/ carb modified diet adherence to prescribed medication regimen contacting provider for new or worsened symptoms or questions Interventions:  Collaboration with Susy Frizzle, MD regarding development and update of comprehensive plan of care as evidenced by provider attestation and co-signature Inter-disciplinary care team collaboration (see longitudinal plan of care) Reviewed medications with patient and discussed importance of medication adherence Reinforced with patient  education related to hypo and  hyperglycemia and importance of correct treatment  Reviewed scheduled/upcoming provider appointments including: 11/17 primary care provider Review of patient status, including review   Reviewed carbohydrate modified diet and plate method Sent copy via My Chart- education- Plate Method Reinforced importance of exercise and getting outside daily Ask patient to return call to care guide as they have attempted x 3 to reach patient, provided contact number to patient Self-Care Activities  Attends all scheduled provider appointments Checks blood sugars as prescribed and utilize hyper and hypoglycemia protocol as needed Adheres to prescribed ADA/carb modified Patient Goals: - check blood sugar at prescribed times and enter into log - check blood sugar if I feel it is too high or too low - take the blood sugar log and glucometer to all doctor visits - take all medications as prescribed - please look over information sent via My Chart- Plate Method - please return call to care guide - call RN care manager for any questions at [phone] - drink 6 to 8 glasses of water each day - read food labels for fat, fiber, carbohydrates and portion size - schedule appointment with eye doctor - check  feet daily for cuts, sores or redness - wash and dry feet carefully every day - wear comfortable, cotton socks - wear comfortable, well-fitting shoes Follow Up Plan: Telephone follow up appointment with care management team member scheduled for:   10/11/2021    Care Plan : Hypertension (Adult)  Updates made by Kassie Mends, RN since 08/02/2021 12:00 AM     Problem: Hypertension (Hypertension)   Priority: Medium     Long-Range Goal: Hypertension Monitored   Start Date: 06/14/2021  Expected End Date: 12/15/2021  This Visit's Progress: On track  Recent Progress: On track  Priority: Medium  Note:   Objective:  Last practice recorded BP readings:  BP Readings from Last 3 Encounters:  03/09/21  128/70  12/08/20 (!) 152/88  06/05/20 (!) 152/92   Most recent eGFR/CrCl: No results found for: EGFR  No components found for: CRCL Current Barriers:  Knowledge Deficits related to basic understanding of hypertension pathophysiology and self care management- patient reports he has blood pressure cuff and checks blood pressure "only on occasion"  Patient reports he walks at times and likes to get out and play music sometimes on the weekends, lives alone and has brother and mother that he can depend on.  Patient reports he rarely eats fast food. Medication profile shows fenofibrate and patient reports he is not taking this medication and plans to talk with his primary care provider with some questions he has (has appointment on 11/17), then will decide whether he wants to take. Patient reports he does not have advanced directives and not interested in completing. Case Manager Clinical Goal(s):  patient will verbalize understanding of plan for hypertension management patient will attend all scheduled medical appointments patient will demonstrate improved adherence to prescribed treatment plan for hypertension as evidenced by taking all medications as prescribed, monitoring and recording blood pressure as directed, adhering to low sodium/DASH diet patient will verbalize basic understanding of hypertension disease process and self health management plan as evidenced by well controlled blood pressure Interventions:  Collaboration with Susy Frizzle, MD regarding development and update of comprehensive plan of care as evidenced by provider attestation and co-signature Inter-disciplinary care team collaboration (see longitudinal plan of care) Evaluation of current treatment plan related to hypertension self management and patient's adherence to plan as established by provider. Reinforced education to patient re: stroke prevention, s/s of heart attack and stroke, DASH diet, complications of uncontrolled  blood pressure Reviewed medications with patient and discussed importance of compliance Reviewed plans with patient for ongoing care management follow up and provided patient with direct contact information for care management team Reinforced with patient, providing education and rationale, to monitor blood pressure 3 x per week and record, calling PCP for findings outside established parameters.  Reviewed scheduled/upcoming provider appointments including: 11/17 primary care provider Reinforced with patient to speak with primary care provider about taking fenofibrate Encouraged healthy diet with patient and to continue cooking at home Self-Care Activities: Self administers medications as prescribed Attends all scheduled provider appointments Calls provider office for new concerns, questions, or BP outside discussed parameters Checks BP and records as discussed Follows a low sodium diet/DASH diet Patient Goals: - check blood pressure 3 times per week and record in log  - be mindful of sodium intake, - read labels - avoid salty snacks and fast food - please talk to your doctor about any questions about taking fenofibrate Follow Up Plan: Telephone follow up appointment with care management team member scheduled for:  10/11/2021     Plan:Telephone follow up appointment with care management team member scheduled for:  10/11/2021  Jacqlyn Larsen [facility], BSN RN Case Manager [facility] [phone]

## 2021-08-16 DIAGNOSIS — H04123 Dry eye syndrome of bilateral lacrimal glands: Secondary | ICD-10-CM | POA: Diagnosis not present

## 2021-08-16 DIAGNOSIS — H33102 Unspecified retinoschisis, left eye: Secondary | ICD-10-CM | POA: Diagnosis not present

## 2021-08-16 DIAGNOSIS — H18611 Keratoconus, stable, right eye: Secondary | ICD-10-CM | POA: Diagnosis not present

## 2021-08-16 DIAGNOSIS — H02822 Cysts of right lower eyelid: Secondary | ICD-10-CM | POA: Diagnosis not present

## 2021-08-16 DIAGNOSIS — L2089 Other atopic dermatitis: Secondary | ICD-10-CM | POA: Diagnosis not present

## 2021-08-16 DIAGNOSIS — H2512 Age-related nuclear cataract, left eye: Secondary | ICD-10-CM | POA: Diagnosis not present

## 2021-08-16 DIAGNOSIS — Z961 Presence of intraocular lens: Secondary | ICD-10-CM | POA: Diagnosis not present

## 2021-08-16 DIAGNOSIS — H52213 Irregular astigmatism, bilateral: Secondary | ICD-10-CM | POA: Diagnosis not present

## 2021-08-16 DIAGNOSIS — H1711 Central corneal opacity, right eye: Secondary | ICD-10-CM | POA: Diagnosis not present

## 2021-08-23 ENCOUNTER — Other Ambulatory Visit: Payer: Self-pay | Admitting: *Deleted

## 2021-08-23 DIAGNOSIS — I10 Essential (primary) hypertension: Secondary | ICD-10-CM | POA: Diagnosis not present

## 2021-08-23 DIAGNOSIS — E118 Type 2 diabetes mellitus with unspecified complications: Secondary | ICD-10-CM | POA: Diagnosis not present

## 2021-08-23 DIAGNOSIS — M545 Low back pain, unspecified: Secondary | ICD-10-CM

## 2021-08-23 NOTE — Telephone Encounter (Signed)
Received call from patient.  Requested refill on Hydrocodone/APAP.   Ok to refill??  Last office visit 03/09/2021.  Last refill 06/22/2021.

## 2021-08-24 MED ORDER — HYDROCODONE-ACETAMINOPHEN 5-325 MG PO TABS: 1.0000 | ORAL_TABLET | Freq: Four times a day (QID) | ORAL | 0 refills | Status: DC | PRN

## 2021-08-27 ENCOUNTER — Other Ambulatory Visit: Payer: Self-pay | Admitting: *Deleted

## 2021-08-27 DIAGNOSIS — M545 Low back pain, unspecified: Secondary | ICD-10-CM

## 2021-08-27 MED ORDER — HYDROCODONE-ACETAMINOPHEN 5-325 MG PO TABS: 1.0000 | ORAL_TABLET | Freq: Four times a day (QID) | ORAL | 0 refills | Status: DC | PRN

## 2021-08-27 NOTE — Telephone Encounter (Signed)
Received call from patient.   Requested refill on Hydrocodone/APAP/   Ok to refill??  Last office visit 03/09/2021.  Last refill 09/04/2021.  Of note, patient has F/U appt on 09/09/2021.

## 2021-08-31 ENCOUNTER — Other Ambulatory Visit: Payer: Self-pay | Admitting: Family Medicine

## 2021-08-31 DIAGNOSIS — M545 Low back pain, unspecified: Secondary | ICD-10-CM

## 2021-08-31 MED ORDER — HYDROCODONE-ACETAMINOPHEN 5-325 MG PO TABS: 1.0000 | ORAL_TABLET | Freq: Four times a day (QID) | ORAL | 0 refills | Status: DC | PRN

## 2021-08-31 NOTE — Telephone Encounter (Signed)
Encounter created in error. Please disregard.

## 2021-08-31 NOTE — Telephone Encounter (Signed)
Per chart pt requested refill on 08/23/21, this was completed on 08/24/21 and sent to [facility], as this is the only pharmacy in his chart. We received another request on 08/26/21, therefore we canceled the 08/24/21 rx and re-ordered the rx, again sent to [facility] 08/27/21. The refills have been approved but there was no notation in chart about a change in pharmacy. [facility] should have been contacting the pt to advise he has a rx to pick up. If pt needs to change pharmacy please confirm.  Attempted to contact pt to advise of above. No answer. LVM to return call.

## 2021-08-31 NOTE — Telephone Encounter (Signed)
Pt requesting change in pharmacy. Pt states he has not received the rx's sent to [facility].  Requested refill on Hydrocodone/APAP/    Ok to refill??   Last office visit 03/09/2021.   Last refill 09/04/2021.   Of note, patient has F/U appt on 09/09/2021.  Please review. Thank you!

## 2021-08-31 NOTE — Telephone Encounter (Signed)
Patient called to follow up on refill of HYDROcodone-acetaminophen (NORCO/VICODIN) 5-325 MG tablet [[id number]]; having difficulty getting refills and wants to speak with someone to find out why. Unable to receive refill for almost 2 weeks.   Pharmacy confirmed   [facility], [address]. [place] [address]  phone number is [phone]  Please advise at [phone]

## 2021-08-31 NOTE — Telephone Encounter (Signed)
Patient called back to confirm that the pharmacy on [address] in [place] is his new pharmacy moving forward. Patient stated he wasn't notified by [facility] of the prescriptions. Please resend to [place].   Please advise at [phone]

## 2021-09-09 ENCOUNTER — Ambulatory Visit (INDEPENDENT_AMBULATORY_CARE_PROVIDER_SITE_OTHER): Payer: Medicare Other | Admitting: Family Medicine

## 2021-09-09 ENCOUNTER — Other Ambulatory Visit: Payer: Self-pay

## 2021-09-09 ENCOUNTER — Encounter: Payer: Self-pay | Admitting: Family Medicine

## 2021-09-09 VITALS — BP 120/70 | HR 91 | Temp 97.6°F | Resp 18 | Ht 66.0 in | Wt 201.0 lb

## 2021-09-09 DIAGNOSIS — I10 Essential (primary) hypertension: Secondary | ICD-10-CM | POA: Diagnosis not present

## 2021-09-09 DIAGNOSIS — E118 Type 2 diabetes mellitus with unspecified complications: Secondary | ICD-10-CM | POA: Diagnosis not present

## 2021-09-09 NOTE — Progress Notes (Signed)
Subjective:    Patient ID: Adam Lozano, male    DOB: 11/01/1970, 50 y.o.   MRN: [id number]  Patient is a very pleasant 50 year old Caucasian male here today for a checkup.  He has a history of eczema along with asthma for which he is on methotrexate due to the severity of the conditions. This is managed by his dermatologist.  He is slowly trying to wean off the methotrexate.  He also uses a combination of Elavil and Westcort creams to help manage his eczema.  He is using meloxicam sparingly for back pain along with tizanidine.  He also has diabetes mellitus 2. HgA1c was 6.1 in 2/22.   Patient is due today to recheck his A1c.  His blood pressure today is excellent.  He denies any chest pain shortness of breath or dyspnea on exertion.  However if he is having pain in his right knee.  He has been there for a couple weeks.  He reports pain over both the medial and lateral joint line.  There is no laxity to varus or valgus stress.  He has a negative anterior and posterior drawer sign.  However he has significant pain with Apley grind.  Has been taking meloxicam and the knee pain is improving gradually Past Medical History:  Diagnosis Date   Asthma    Atopic dermatitis    Diabetes mellitus without complication (HCC)    borderline   GERD (gastroesophageal reflux disease)    Gout    Low back pain    Chronic   RLS (restless legs syndrome)    Past Surgical History:  Procedure Laterality Date   EYE SURGERY  02/24/14/15   cataracts rt eye   Current Outpatient Medications on File Prior to Visit  Medication Sig Dispense Refill   amLODipine (NORVASC) 10 MG tablet TAKE 1 TABLET (10 MG TOTAL) BY MOUTH DAILY. STOP LOSARTAN 90 tablet 3   Artificial Tear Ointment (ARTIFICIAL TEARS) ointment Place 1 application into both eyes daily.      BEPREVE 1.5 % SOLN Place 1 drop into both eyes 2 (two) times daily.  11   budesonide-formoterol (SYMBICORT) 160-4.5 MCG/ACT inhaler INHALE 2 PUFFS INTO THE LUNGS TWICE  A DAY 10.2 each 6   carboxymethylcellulose (REFRESH PLUS) 0.5 % SOLN Place 1 drop into both eyes 2 (two) times daily.      cetirizine (ZYRTEC) 10 MG tablet Take 10 mg by mouth daily.     clobetasol cream (TEMOVATE) 3.79 % Apply 1 application topically 2 (two) times daily.     erythromycin ophthalmic ointment SMARTSIG:Sparingly In Eye(s) Daily     fenofibrate 160 MG tablet Take 1 tablet (160 mg total) by mouth daily. 90 tablet 1   fish oil-omega-3 fatty acids 1000 MG capsule Take 1 g by mouth daily.      folic acid (FOLVITE) 1 MG tablet Take 1 mg by mouth daily.  8   HYDROcodone-acetaminophen (NORCO/VICODIN) 5-325 MG tablet Take 1 tablet by mouth every 6 (six) hours as needed for moderate pain. 30 tablet 0   hydrocortisone valerate cream (WESTCORT) 0.2 % Apply 1 application topically daily.     meloxicam (MOBIC) 7.5 MG tablet TAKE 1 TABLET BY MOUTH EVERY DAY 30 tablet 3   methotrexate (RHEUMATREX) 2.5 MG tablet Take 12.5 mg by mouth once a week. Caution:Chemotherapy. Protect from light. Take on sundays Pt takes 4 pills every Sunday to equal 10 mg     pimecrolimus (ELIDEL) 1 % cream Apply topically 2 (two)  times daily.     tiZANidine (ZANAFLEX) 4 MG tablet TAKE 1 TABLET BY MOUTH EVERY 6 HOURS AS NEEDED FOR MUSCLE SPASM 360 tablet 1   montelukast (SINGULAIR) 10 MG tablet Take 1 tablet (10 mg total) by mouth at bedtime. (Patient not taking: Reported on 03/09/2021) 90 tablet 3   No current facility-administered medications on file prior to visit.   Allergies  Allergen Reactions   Coconut Oil Hives and Itching    MAKES THROAT ITCH, ALSO   Other Hives and Itching    NO TREE NUTS   Peanut-Containing Drug Products Hives and Itching    MAKES THROAT ITCH, ALSO   Latex Rash   Social History   Socioeconomic History   Marital status: Single    Spouse name: Not on file   Number of children: 1   Years of education: Not on file   Highest education level: Not on file  Occupational History    Occupation: Disability  Tobacco Use   Smoking status: Former    Types: Cigarettes    Quit date: 11/25/2011    Years since quitting: 9.7   Smokeless tobacco: Never   Tobacco comments:    07/22/21-Pt declines low dose CT scan.  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   Mom living. 1 Brother, 1 daughter.   Social Determinants of Health   Financial Resource Strain: Low Risk    Difficulty of Paying Living Expenses: Not hard at all  Food Insecurity: No Food Insecurity   Worried About Charity fundraiser in the Last Year: Never true   [place] in the Last Year: Never true  Transportation Needs: No Transportation Needs   Lack of Transportation (Medical): No   Lack of Transportation (Non-Medical): No  Physical Activity: Insufficiently Active   Days of Exercise per Week: 3 days   Minutes of Exercise per Session: 20 min  Stress: No Stress Concern Present   Feeling of Stress : Not at all  Social Connections: Socially Isolated   Frequency of Communication with Friends and Family: More than three times a week   Frequency of Social Gatherings with Friends and Family: More than three times a week   Attends Religious Services: Never   Marine scientist or Organizations: No   Attends Music therapist: Never   Marital Status: Never married  Human resources officer Violence: Not At Risk   Fear of Current or Ex-Partner: No   Emotionally Abused: No   Physically Abused: No   Sexually Abused: No      Review of Systems  Musculoskeletal:  Positive for back pain.  All other systems reviewed and are negative.     Objective:   Physical Exam Vitals reviewed.  Constitutional:      General: He is not in acute distress.    Appearance: Normal appearance. He is obese. He is not ill-appearing.   Cardiovascular:     Rate and Rhythm: Normal rate and regular rhythm.     Pulses: Normal pulses.     Heart sounds: Normal heart sounds. No murmur heard.   No friction rub. No gallop.  Pulmonary:     Effort: Pulmonary effort is normal. No respiratory distress.     Breath sounds: Normal breath sounds. No  stridor. No wheezing, rhonchi or rales.  Chest:     Chest wall: No tenderness.  Abdominal:     General: Bowel sounds are normal.     Palpations: Abdomen is soft.     Tenderness: There is no abdominal tenderness. There is no guarding or rebound.  Musculoskeletal:     Right knee: No bony tenderness. Decreased range of motion. No tenderness. No LCL laxity, MCL laxity, ACL laxity or PCL laxity. Abnormal meniscus.     Right lower leg: No edema.     Left lower leg: No edema.  Skin:    Findings: Rash present.  Neurological:     Mental Status: He is alert.          Assessment & Plan:    Controlled type 2 diabetes mellitus with complication, without long-term current use of insulin ([facility]) - Plan: COMPLETE METABOLIC PANEL WITH GFR, Lipid panel, Hemoglobin A1c  Benign essential HTN   Repeat his hemoglobin A1c.  He is currently a diet-controlled diabetic.  Strongly recommended low carbohydrate diet avoiding bread, potatoes, rice, and pasta.  Recommended he avoid drinking sugar such as sweet tea and soda.  Continue to work on weight loss.  Check hemoglobin A1c.  If less than 6.5, I will also check a fasting lipid panel.  Ideally I like to keep his LDL cholesterol below 100.  Blood pressure today is well controlled.  I believe his knee pain could be due to a meniscal tear.  At the present time the pain is improving so he would like to continue meloxicam.  If worsening he could return for cortisone injection and an x-ray.

## 2021-09-09 NOTE — Progress Notes (Signed)
Pt IS NOT fasting currently.

## 2021-09-10 ENCOUNTER — Other Ambulatory Visit: Payer: Self-pay | Admitting: Family Medicine

## 2021-09-10 LAB — COMPLETE METABOLIC PANEL WITH GFR
AG Ratio: 2.2 (calc) (ref 1.0–2.5)
ALT: 20 U/L (ref 9–46)
AST: 15 U/L (ref 10–40)
Albumin: 4.3 g/dL (ref 3.6–5.1)
Alkaline phosphatase (APISO): 70 U/L (ref 36–130)
BUN: 13 mg/dL (ref 7–25)
CO2: 27 mmol/L (ref 20–32)
Calcium: 9.5 mg/dL (ref 8.6–10.3)
Chloride: 106 mmol/L (ref 98–110)
Creat: 0.88 mg/dL (ref 0.60–1.29)
Globulin: 2 g/dL (calc) (ref 1.9–3.7)
Glucose, Bld: 135 mg/dL — ABNORMAL HIGH (ref 65–99)
Potassium: 4.2 mmol/L (ref 3.5–5.3)
Sodium: 141 mmol/L (ref 135–146)
Total Bilirubin: 0.2 mg/dL (ref 0.2–1.2)
Total Protein: 6.3 g/dL (ref 6.1–8.1)
eGFR: 106 mL/min/{1.73_m2} (ref >=60)

## 2021-09-10 LAB — HEMOGLOBIN A1C
Hgb A1c MFr Bld: 5.9 % of total Hgb — ABNORMAL HIGH (ref <5.7)
Mean Plasma Glucose: 123 mg/dL
eAG (mmol/L): 6.8 mmol/L

## 2021-09-10 LAB — LIPID PANEL
Cholesterol: 175 mg/dL (ref <200)
HDL: 33 mg/dL — ABNORMAL LOW (ref >=40)
Non-HDL Cholesterol (Calc): 142 mg/dL (calc) — ABNORMAL HIGH (ref <130)
Total CHOL/HDL Ratio: 5.3 (calc) — ABNORMAL HIGH (ref <5.0)
Triglycerides: 547 mg/dL — ABNORMAL HIGH (ref <150)

## 2021-09-13 DIAGNOSIS — L2089 Other atopic dermatitis: Secondary | ICD-10-CM | POA: Diagnosis not present

## 2021-10-11 ENCOUNTER — Ambulatory Visit (INDEPENDENT_AMBULATORY_CARE_PROVIDER_SITE_OTHER): Payer: Medicare Other | Admitting: *Deleted

## 2021-10-11 DIAGNOSIS — E118 Type 2 diabetes mellitus with unspecified complications: Secondary | ICD-10-CM

## 2021-10-11 DIAGNOSIS — I10 Essential (primary) hypertension: Secondary | ICD-10-CM

## 2021-10-11 NOTE — Patient Instructions (Signed)
Visit Information  Thank you for taking time to visit with me today. Please don't hesitate to contact me if I can be of assistance to you before our next scheduled telephone appointment.  Following are the goals we discussed today:  Take medications as prescribed   Attend all scheduled provider appointments Perform all self care activities independently  Perform IADL's (shopping, preparing meals, housekeeping, managing finances) independently Call provider office for new concerns or questions  check blood sugar at prescribed times: per doctor order  check feet daily for cuts, sores or redness take the blood sugar log to all doctor visits take the blood sugar meter to all doctor visits drink 6 to 8 glasses of water each day fill half of plate with vegetables limit fast food meals to no more than 1 per week check blood pressure 3 times per week choose a place to take my blood pressure (home, clinic or office, retail store) write blood pressure results in a log or diary take blood pressure log to all doctor appointments take medications for blood pressure exactly as prescribed Follow a low sodium, carbohydrate modified diet Call RN care manager for any questions at [phone]  Our next appointment is by telephone on 01/03/2022 at 9 am  Please call the care guide team at [phone] if you need to cancel or reschedule your appointment.   If you are experiencing a Mental Health or [place] or need someone to talk to, please call the [place] National Suicide Prevention Lifeline: 908-321-3369 or TTY: (681) 211-0506 TTY 901-150-3671) to talk to a trained counselor call 1-800-273-TALK (toll free, 24 hour hotline) go to [facility] [address], [place] [phone]) call 911   Patient verbalizes understanding of instructions provided today and agrees to view in [place].   Jacqlyn Larsen RNC, BSN RN Case Manager [facility] [phone]

## 2021-10-11 NOTE — Chronic Care Management (AMB) (Signed)
Chronic Care Management   CCM RN Visit Note  10/11/2021 Name: SAMY RYNER MRN: [id number] DOB: 12-23-1970  Subjective: BENUEL LY is a 50 y.o. year old male who is a primary care patient of Pickard, Cammie Mcgee, MD. The care management team was consulted for assistance with disease management and care coordination needs.    Engaged with patient by telephone for follow up visit in response to provider referral for case management and/or care coordination services.   Consent to Services:  The patient was given information about Chronic Care Management services, agreed to services, and gave verbal consent prior to initiation of services.  Please see initial visit note for detailed documentation.   Patient agreed to services and verbal consent obtained.   Assessment: Review of patient past medical history, allergies, medications, health status, including review of consultants reports, laboratory and other test data, was performed as part of comprehensive evaluation and provision of chronic care management services.   SDOH (Social Determinants of Health) assessments and interventions performed:    CCM Care Plan  Allergies  Allergen Reactions   Coconut Oil Hives and Itching    MAKES THROAT ITCH, ALSO   Other Hives and Itching    NO TREE NUTS   Peanut-Containing Drug Products Hives and Itching    MAKES THROAT ITCH, ALSO   Latex Rash    Outpatient Encounter Medications as of 10/11/2021  Medication Sig Note   amLODipine (NORVASC) 10 MG tablet TAKE 1 TABLET (10 MG TOTAL) BY MOUTH DAILY. STOP LOSARTAN    Artificial Tear Ointment (ARTIFICIAL TEARS) ointment Place 1 application into both eyes daily.     BEPREVE 1.5 % SOLN Place 1 drop into both eyes 2 (two) times daily.    budesonide-formoterol (SYMBICORT) 160-4.5 MCG/ACT inhaler INHALE 2 PUFFS BY MOUTH INTO THE LUNGS TWICE A DAY.    carboxymethylcellulose (REFRESH PLUS) 0.5 % SOLN Place 1 drop into both eyes 2 (two) times daily.      cetirizine (ZYRTEC) 10 MG tablet Take 10 mg by mouth daily.    clobetasol cream (TEMOVATE) 8.00 % Apply 1 application topically 2 (two) times daily.    erythromycin ophthalmic ointment SMARTSIG:Sparingly In Eye(s) Daily    fenofibrate 160 MG tablet Take 1 tablet (160 mg total) by mouth daily.    fish oil-omega-3 fatty acids 1000 MG capsule Take 1 g by mouth daily.     folic acid (FOLVITE) 1 MG tablet Take 1 mg by mouth daily.    HYDROcodone-acetaminophen (NORCO/VICODIN) 5-325 MG tablet Take 1 tablet by mouth every 6 (six) hours as needed for moderate pain.    hydrocortisone valerate cream (WESTCORT) 0.2 % Apply 1 application topically daily.    meloxicam (MOBIC) 7.5 MG tablet TAKE 1 TABLET BY MOUTH EVERY DAY    methotrexate (RHEUMATREX) 2.5 MG tablet Take 12.5 mg by mouth once a week. Caution:Chemotherapy. Protect from light. Take on sundays Pt takes 4 pills every Sunday to equal 10 mg 04/01/2016: SUNDAYS   montelukast (SINGULAIR) 10 MG tablet Take 1 tablet (10 mg total) by mouth at bedtime.    pimecrolimus (ELIDEL) 1 % cream Apply topically 2 (two) times daily.    tiZANidine (ZANAFLEX) 4 MG tablet TAKE 1 TABLET BY MOUTH EVERY 6 HOURS AS NEEDED FOR MUSCLE SPASM    No facility-administered encounter medications on file as of 10/11/2021.    Patient Active Problem List   Diagnosis Date Noted   Gout 10/21/2015   Hyperglycemia 10/01/2014   GLUCOSE INTOLERANCE 06/04/2010  DEPRESSION 05/21/2010   RESTLESS LEGS SYNDROME 05/21/2010   INSOMNIA 06/11/2008   ANXIETY DISORDER, GENERALIZED 02/24/2007   BLEPHARITIS NOS 02/24/2007   Asthma 02/24/2007   DERMATITIS, OTHER ATOPIC 02/24/2007   Bilateral low back pain without sciatica 02/24/2007   PNEUMONIA, HX OF 02/24/2007    Conditions to be addressed/monitored:HTN and DMII  Care Plan : [place] of Care  Updates made by Kassie Mends, RN since 10/11/2021 12:00 AM     Problem: No plan of care established for management of chronic  disease states  (HTN, DM2, Asthma)   Priority: High     Long-Range Goal: Development of plan of care for chronic disease management  (HTN, DM2, Asthma)   Start Date: 10/11/2021  Expected End Date: 04/09/2022  Priority: High  Note:   Current Barriers:  Knowledge Deficits related to plan of care for management of HTN and DMII  Knowledge Deficits related to basic Diabetes pathophysiology and self care/management- needs reinforcement ADA / Carbohydrate modified diet.  Patient repots he lives alone, has brother and mother that lives about 15 minutes away if he needed assistance.  Patient reports he checks CBG "on occasion" and was not instructed to check CBG, pt is not on medication for diabetes, does not have reading for today. Patient reports he may have some mold growth in his home due to previous water leak, pt has asthma that is well controlled at present, does request any resources for mold prevention, eradication, care guide has outreached pt x 3 and left messages but has not spoken to patient, pt still interested in resources and will return call to care guide. Knowledge Deficits related to basic understanding of hypertension pathophysiology and self care management- patient reports he has blood pressure cuff and checks blood pressure "only on occasion"  Patient reports he walks at times and likes to get out and play music sometimes on the weekends, lives alone and has brother and mother that he can depend on.  Patient reports he rarely eats fast food.   RNCM Clinical Goal(s):  Patient will verbalize understanding of plan for management of HTN and DMII as evidenced by patient report, review of EHR and  through collaboration with RN Care manager, provider, and care team.   Interventions: 1:1 collaboration with primary care provider regarding development and update of comprehensive plan of care as evidenced by provider attestation and co-signature Inter-disciplinary care team collaboration (see  longitudinal plan of care) Evaluation of current treatment plan related to  self management and patient's adherence to plan as established by provider  Diabetes Interventions:  (Status:  Goal on track:  Yes.) Long Term Goal Assessed patient's understanding of A1c goal: <7% Reviewed medications with patient and discussed importance of medication adherence Discussed plans with patient for ongoing care management follow up and provided patient with direct contact information for care management team Review of patient status, including review of consultants reports, relevant laboratory and other test results, and medications completed Reinforced carbohydrate modified diet, plate method Lab Results  Component Value Date   HGBA1C 5.9 (H) 09/09/2021   Hypertension Interventions:  (Status:  Goal on track:  Yes.) Long Term Goal Last practice recorded BP readings:  BP Readings from Last 3 Encounters:  09/09/21 120/70  07/22/21 128/72  03/09/21 128/70  Most recent eGFR/CrCl:  Lab Results  Component Value Date   EGFR 106 09/09/2021    No components found for: CRCL  Reviewed medications with patient and discussed importance of compliance Discussed  plans with patient for ongoing care management follow up and provided patient with direct contact information for care management team Discussed complications of poorly controlled blood pressure such as heart disease, stroke, circulatory complications, vision complications, kidney impairment, sexual dysfunction  Pain assessment completed Reinforced low sodium diet Reviewed doing light exercise daily if able  Patient Goals/Self-Care Activities: Take medications as prescribed   Attend all scheduled provider appointments Perform all self care activities independently  Perform IADL's (shopping, preparing meals, housekeeping, managing finances) independently Call provider office for new concerns or questions  check blood sugar at prescribed times: per  doctor order  check feet daily for cuts, sores or redness take the blood sugar log to all doctor visits take the blood sugar meter to all doctor visits drink 6 to 8 glasses of water each day fill half of plate with vegetables limit fast food meals to no more than 1 per week check blood pressure 3 times per week choose a place to take my blood pressure (home, clinic or office, retail store) write blood pressure results in a log or diary take blood pressure log to all doctor appointments take medications for blood pressure exactly as prescribed Follow a low sodium, carbohydrate modified diet Call RN care manager for any questions at [phone]  Follow Up Plan:  Telephone follow up appointment with care management team member scheduled for:  01/03/2022      Plan:Telephone follow up appointment with care management team member scheduled for:  01/03/2022  Jacqlyn Larsen [employer], BSN RN Case Manager [facility] [phone]

## 2021-10-20 ENCOUNTER — Telehealth: Payer: Self-pay | Admitting: *Deleted

## 2021-10-20 NOTE — Telephone Encounter (Signed)
Message left on voicemail about [employer] research. To see if pt is interested in receiving information about research study.  My contact information also left.

## 2021-10-23 DIAGNOSIS — E118 Type 2 diabetes mellitus with unspecified complications: Secondary | ICD-10-CM | POA: Diagnosis not present

## 2021-10-23 DIAGNOSIS — I10 Essential (primary) hypertension: Secondary | ICD-10-CM | POA: Diagnosis not present

## 2021-10-26 ENCOUNTER — Telehealth: Payer: Self-pay

## 2021-10-26 NOTE — Telephone Encounter (Signed)
Patient called not feeling well.  He complains of headache, body aches, temp of 99.6.  Advised patient to stay hydrated and treat symptoms.  Also to test for covid and if not better in a couple days to call back.  Denies respiratory distress.

## 2021-11-09 ENCOUNTER — Other Ambulatory Visit: Payer: Self-pay

## 2021-11-09 DIAGNOSIS — M545 Low back pain, unspecified: Secondary | ICD-10-CM

## 2021-11-09 MED ORDER — HYDROCODONE-ACETAMINOPHEN 5-325 MG PO TABS: 1.0000 | ORAL_TABLET | Freq: Four times a day (QID) | ORAL | 0 refills | Status: DC | PRN

## 2021-11-09 NOTE — Telephone Encounter (Signed)
LOV 09/09/21 Last refill 08/31/21, #30, 0 refills  Please review, thanks!

## 2021-12-06 ENCOUNTER — Telehealth: Payer: Self-pay

## 2021-12-06 NOTE — Telephone Encounter (Signed)
Pt called  He is wanting labs done again for himself and PCP  He is aware he has a f/u in May and labs will be checked again then

## 2021-12-06 NOTE — Telephone Encounter (Signed)
Pt called in wanting to schedule labs for another appt at another office. Pt doesn't have an appt scheduled for this office until 5/18. Please call pt with info if labs are needed. Please advise.  Cb#: [phone]

## 2021-12-14 DIAGNOSIS — L720 Epidermal cyst: Secondary | ICD-10-CM | POA: Diagnosis not present

## 2021-12-14 DIAGNOSIS — H43391 Other vitreous opacities, right eye: Secondary | ICD-10-CM | POA: Diagnosis not present

## 2021-12-14 DIAGNOSIS — Z79899 Other long term (current) drug therapy: Secondary | ICD-10-CM | POA: Diagnosis not present

## 2021-12-14 DIAGNOSIS — L2089 Other atopic dermatitis: Secondary | ICD-10-CM | POA: Diagnosis not present

## 2022-01-03 ENCOUNTER — Telehealth: Payer: Self-pay | Admitting: *Deleted

## 2022-01-03 ENCOUNTER — Encounter: Payer: Self-pay | Admitting: *Deleted

## 2022-01-03 ENCOUNTER — Telehealth: Payer: Medicare Other

## 2022-01-03 NOTE — Telephone Encounter (Signed)
?  Care Management  ? ?Follow Up Note ? ? ?01/03/2022 ?Name: Adam Lozano MRN: [id number] DOB: [birth year] ? ? ?Referred by: Susy Frizzle, MD ?Reason for referral : Chronic Care Management (HTN, DM2) ? ? ?An unsuccessful telephone outreach was attempted today. The patient was referred to the case management team for assistance with care management and care coordination.  ? ?Follow Up Plan: Telephone follow up appointment with care management team member scheduled for: upon care guide rescheduling. ? ?Jacqlyn Larsen RNC, BSN ?RN Case Manager ?[place] ?[phone] ? ? ?

## 2022-01-03 NOTE — Telephone Encounter (Signed)
Erroneous encounter

## 2022-01-10 DIAGNOSIS — L2089 Other atopic dermatitis: Secondary | ICD-10-CM | POA: Diagnosis not present

## 2022-01-10 DIAGNOSIS — H04123 Dry eye syndrome of bilateral lacrimal glands: Secondary | ICD-10-CM | POA: Diagnosis not present

## 2022-01-10 DIAGNOSIS — H43391 Other vitreous opacities, right eye: Secondary | ICD-10-CM | POA: Diagnosis not present

## 2022-01-10 DIAGNOSIS — H2512 Age-related nuclear cataract, left eye: Secondary | ICD-10-CM | POA: Diagnosis not present

## 2022-01-19 ENCOUNTER — Other Ambulatory Visit: Payer: Self-pay

## 2022-01-19 DIAGNOSIS — M545 Low back pain, unspecified: Secondary | ICD-10-CM

## 2022-01-19 NOTE — Telephone Encounter (Signed)
LOV 09/09/21 ?Last refill 11/09/21, #30, 0 refills ? ?Please review, thanks! ? ? ?

## 2022-01-20 MED ORDER — HYDROCODONE-ACETAMINOPHEN 5-325 MG PO TABS: 1.0000 | ORAL_TABLET | Freq: Four times a day (QID) | ORAL | 0 refills | Status: DC | PRN

## 2022-01-24 ENCOUNTER — Ambulatory Visit (INDEPENDENT_AMBULATORY_CARE_PROVIDER_SITE_OTHER): Payer: Medicare Other | Admitting: *Deleted

## 2022-01-24 DIAGNOSIS — I10 Essential (primary) hypertension: Secondary | ICD-10-CM

## 2022-01-24 DIAGNOSIS — E118 Type 2 diabetes mellitus with unspecified complications: Secondary | ICD-10-CM

## 2022-01-24 NOTE — Chronic Care Management (AMB) (Signed)
?Chronic Care Management  ? ?CCM RN Visit Note ? ?01/24/2022 ?Name: Adam Lozano MRN: [id number] DOB: July 14, 1971 ? ?Subjective: ?Adam Lozano is a 51 y.o. year old male who is a primary care patient of Pickard, Cammie Mcgee, MD. The care management team was consulted for assistance with disease management and care coordination needs.   ? ?Engaged with patient by telephone for follow up visit in response to provider referral for case management and/or care coordination services.  ? ?Consent to Services:  ?The patient was given information about Chronic Care Management services, agreed to services, and gave verbal consent prior to initiation of services.  Please see initial visit note for detailed documentation.  ? ?Patient agreed to services and verbal consent obtained.  ? ?Assessment: Review of patient past medical history, allergies, medications, health status, including review of consultants reports, laboratory and other test data, was performed as part of comprehensive evaluation and provision of chronic care management services.  ? ?SDOH (Social Determinants of Health) assessments and interventions performed:   ? ?CCM Care Plan ? ?Allergies  ?Allergen Reactions  ? Coconut Oil Hives and Itching  ?  MAKES THROAT ITCH, ALSO  ? Other Hives and Itching  ?  NO TREE NUTS  ? Peanut-Containing Drug Products Hives and Itching  ?  MAKES THROAT ITCH, ALSO  ? Latex Rash  ? ? ?Outpatient Encounter Medications as of 01/24/2022  ?Medication Sig Note  ? amLODipine (NORVASC) 10 MG tablet TAKE 1 TABLET (10 MG TOTAL) BY MOUTH DAILY. STOP LOSARTAN   ? Artificial Tear Ointment (ARTIFICIAL TEARS) ointment Place 1 application into both eyes daily.    ? BEPREVE 1.5 % SOLN Place 1 drop into both eyes 2 (two) times daily.   ? budesonide-formoterol (SYMBICORT) 160-4.5 MCG/ACT inhaler INHALE 2 PUFFS BY MOUTH INTO THE LUNGS TWICE A DAY.   ? carboxymethylcellulose (REFRESH PLUS) 0.5 % SOLN Place 1 drop into both eyes 2 (two) times daily.    ?  cetirizine (ZYRTEC) 10 MG tablet Take 10 mg by mouth daily.   ? clobetasol cream (TEMOVATE) 7.82 % Apply 1 application topically 2 (two) times daily.   ? erythromycin ophthalmic ointment SMARTSIG:Sparingly In Eye(s) Daily   ? fenofibrate 160 MG tablet Take 1 tablet (160 mg total) by mouth daily.   ? fish oil-omega-3 fatty acids 1000 MG capsule Take 1 g by mouth daily.    ? folic acid (FOLVITE) 1 MG tablet Take 1 mg by mouth daily.   ? HYDROcodone-acetaminophen (NORCO/VICODIN) 5-325 MG tablet Take 1 tablet by mouth every 6 (six) hours as needed for moderate pain.   ? hydrocortisone valerate cream (WESTCORT) 0.2 % Apply 1 application topically daily.   ? meloxicam (MOBIC) 7.5 MG tablet TAKE 1 TABLET BY MOUTH EVERY DAY   ? methotrexate (RHEUMATREX) 2.5 MG tablet Take 12.5 mg by mouth once a week. Caution:Chemotherapy. Protect from light. Take on sundays ?Pt takes 4 pills every Sunday to equal 10 mg 04/01/2016: SUNDAYS  ? montelukast (SINGULAIR) 10 MG tablet Take 1 tablet (10 mg total) by mouth at bedtime.   ? pimecrolimus (ELIDEL) 1 % cream Apply topically 2 (two) times daily.   ? tiZANidine (ZANAFLEX) 4 MG tablet TAKE 1 TABLET BY MOUTH EVERY 6 HOURS AS NEEDED FOR MUSCLE SPASM   ? ?No facility-administered encounter medications on file as of 01/24/2022.  ? ? ?Patient Active Problem List  ? Diagnosis Date Noted  ? Gout 10/21/2015  ? Hyperglycemia 10/01/2014  ? GLUCOSE INTOLERANCE 06/04/2010  ?  DEPRESSION 05/21/2010  ? RESTLESS LEGS SYNDROME 05/21/2010  ? INSOMNIA 06/11/2008  ? ANXIETY DISORDER, GENERALIZED 02/24/2007  ? BLEPHARITIS NOS 02/24/2007  ? Asthma 02/24/2007  ? DERMATITIS, OTHER ATOPIC 02/24/2007  ? Bilateral low back pain without sciatica 02/24/2007  ? PNEUMONIA, HX OF 02/24/2007  ? ? ?Conditions to be addressed/monitored:HTN and DMII ? ?Care Plan : RN Care Manager Plan of Care  ?Updates made by Kassie Mends, RN since 01/24/2022 12:00 AM  ?  ? ?Problem: No plan of care established for management of chronic  disease states  (HTN, DM2, Asthma)   ?Priority: High  ?  ? ?Long-Range Goal: Development of plan of care for chronic disease management  (HTN, DM2, Asthma)   ?Start Date: 10/11/2021  ?Expected End Date: 07/23/2022  ?Priority: High  ?Note:   ?Current Barriers:  ?Knowledge Deficits related to plan of care for management of HTN and DMII  ?Knowledge Deficits related to basic Diabetes pathophysiology and self care/management- needs reinforcement ADA / Carbohydrate modified diet.  Patient reports he lives alone, has brother and mother that lives about 15 minutes away if he needs assistance.  Patient reports he checks CBG "on occasion" and was not instructed to check CBG, pt is not on medication for diabetes, does not have reading for today. Patient reports he may have some mold growth in his home due to previous water leak, pt has asthma that is well controlled at present, does request any resources for mold prevention, eradication, care guide has outreached pt x 3 and left messages but has not spoken to patient, pt still interested in resources and will return call to care guide. ?Knowledge Deficits related to basic understanding of hypertension pathophysiology and self care management- patient reports he has blood pressure cuff and checks blood pressure "only on occasion"  Patient reports he walks at times and likes to get out and play music sometimes on the weekends, lives alone and has brother and mother that he can depend on.  Patient reports he rarely eats fast food. No new concerns or issues reported today. ? ?RNCM Clinical Goal(s):  ?Patient will verbalize understanding of plan for management of HTN and DMII as evidenced by patient report, review of EHR and  through collaboration with RN Care manager, provider, and care team.  ? ?Interventions: ?1:1 collaboration with primary care provider regarding development and update of comprehensive plan of care as evidenced by provider attestation and  co-signature ?Inter-disciplinary care team collaboration (see longitudinal plan of care) ?Evaluation of current treatment plan related to  self management and patient's adherence to plan as established by provider ? ?Diabetes Interventions:  (Status:  Goal on track:  Yes.) Long Term Goal ?Assessed patient's understanding of A1c goal: <7% ?Reviewed medications with patient and discussed importance of medication adherence ?Review of patient status, including review of consultants reports, relevant laboratory and other test results, and medications completed ?Reviewed carbohydrate modified diet, plate method, nutritional food choices ?Lab Results  ?Component Value Date  ? HGBA1C 5.9 (H) 09/09/2021  ? ?Hypertension Interventions:  (Status:  Goal on track:  Yes.) Long Term Goal ?Last practice recorded BP readings:  ?BP Readings from Last 3 Encounters:  ?09/09/21 120/70  ?07/22/21 128/72  ?03/09/21 128/70  ?Most recent eGFR/CrCl:  ?Lab Results  ?Component Value Date  ? EGFR 106 09/09/2021  ?  No components found for: CRCL ? ?Reviewed medications with patient and discussed importance of compliance ?Discussed complications of poorly controlled blood pressure such as heart disease, stroke, circulatory  complications, vision complications, kidney impairment, sexual dysfunction  ?Pain assessment completed ?Reviewed low sodium diet ?Reinforced doing light exercise daily if able ?Reviewed upcoming scheduled appointments - primary care provider 03/10/22 ? ?Patient Goals/Self-Care Activities: ?Take medications as prescribed   ?Attend all scheduled provider appointments ?Perform all self care activities independently  ?Perform IADL's (shopping, preparing meals, housekeeping, managing finances) independently ?Call provider office for new concerns or questions  ?check blood sugar at prescribed times: per doctor order  ?check feet daily for cuts, sores or redness ?take the blood sugar log to all doctor visits ?take the blood sugar meter  to all doctor visits ?drink 6 to 8 glasses of water each day ?fill half of plate with vegetables ?wash and dry feet carefully every day ?wear comfortable, cotton socks ?wear comfortable, well-fitting shoes ?check blood pressure 3

## 2022-01-24 NOTE — Patient Instructions (Signed)
Visit Information ? ?Thank you for taking time to visit with me today. Please don't hesitate to contact me if I can be of assistance to you before our next scheduled telephone appointment. ? ?Following are the goals we discussed today:  ?Take medications as prescribed   ?Attend all scheduled provider appointments ?Perform all self care activities independently  ?Perform IADL's (shopping, preparing meals, housekeeping, managing finances) independently ?Call provider office for new concerns or questions  ?check blood sugar at prescribed times: per doctor order  ?check feet daily for cuts, sores or redness ?take the blood sugar log to all doctor visits ?take the blood sugar meter to all doctor visits ?drink 6 to 8 glasses of water each day ?fill half of plate with vegetables ?wash and dry feet carefully every day ?wear comfortable, cotton socks ?wear comfortable, well-fitting shoes ?check blood pressure 3 times per week ?choose a place to take my blood pressure (home, clinic or office, retail store) ?write blood pressure results in a log or diary ?take blood pressure log to all doctor appointments ?take medications for blood pressure exactly as prescribed ?eat more whole grains, fruits and vegetables, lean meats and healthy fats ?Follow a low sodium, carbohydrate modified diet ?Avoid concentrated sweets ?Practice portion control ?Call RN care manager for any questions at [phone] ? ?Our next appointment is by telephone on 04/18/22 at 130 pm ? ?Please call the care guide team at [phone] if you need to cancel or reschedule your appointment.  ? ?If you are experiencing a Mental Health or [place] or need someone to talk to, please call the Suicide and Crisis Lifeline: 988 ?call the [place] National Suicide Prevention Lifeline: 931-560-3494 or TTY: (862)269-4064 TTY 502-362-8827) to talk to a trained counselor ?call 1-800-273-TALK (toll free, 24 hour hotline) ?go to [facility] [address], [place] ([phone]) ?call 911  ? ?Patient verbalizes understanding of instructions and care plan provided today and agrees to view in [place]. Active MyChart status confirmed with patient.   ? ?Jacqlyn Larsen RNC, BSN ?RN Case Manager ?[place] ?[phone] ? ?

## 2022-02-20 DIAGNOSIS — E118 Type 2 diabetes mellitus with unspecified complications: Secondary | ICD-10-CM | POA: Diagnosis not present

## 2022-02-20 DIAGNOSIS — I10 Essential (primary) hypertension: Secondary | ICD-10-CM | POA: Diagnosis not present

## 2022-03-09 DIAGNOSIS — H43813 Vitreous degeneration, bilateral: Secondary | ICD-10-CM | POA: Diagnosis not present

## 2022-03-09 DIAGNOSIS — H4311 Vitreous hemorrhage, right eye: Secondary | ICD-10-CM | POA: Diagnosis not present

## 2022-03-09 DIAGNOSIS — T8522XA Displacement of intraocular lens, initial encounter: Secondary | ICD-10-CM | POA: Diagnosis not present

## 2022-03-09 DIAGNOSIS — H35462 Secondary vitreoretinal degeneration, left eye: Secondary | ICD-10-CM | POA: Diagnosis not present

## 2022-03-09 DIAGNOSIS — H33301 Unspecified retinal break, right eye: Secondary | ICD-10-CM | POA: Diagnosis not present

## 2022-03-09 DIAGNOSIS — H33021 Retinal detachment with multiple breaks, right eye: Secondary | ICD-10-CM | POA: Diagnosis not present

## 2022-03-09 DIAGNOSIS — H33102 Unspecified retinoschisis, left eye: Secondary | ICD-10-CM | POA: Diagnosis not present

## 2022-03-09 DIAGNOSIS — H35412 Lattice degeneration of retina, left eye: Secondary | ICD-10-CM | POA: Diagnosis not present

## 2022-03-10 ENCOUNTER — Encounter: Payer: Self-pay | Admitting: Family Medicine

## 2022-03-10 ENCOUNTER — Ambulatory Visit (INDEPENDENT_AMBULATORY_CARE_PROVIDER_SITE_OTHER): Payer: Medicare Other | Admitting: Family Medicine

## 2022-03-10 VITALS — BP 130/92 | HR 88 | Temp 97.7°F | Ht 66.0 in | Wt 196.8 lb

## 2022-03-10 DIAGNOSIS — E118 Type 2 diabetes mellitus with unspecified complications: Secondary | ICD-10-CM

## 2022-03-10 DIAGNOSIS — I10 Essential (primary) hypertension: Secondary | ICD-10-CM

## 2022-03-10 NOTE — Progress Notes (Signed)
Subjective:    Patient ID: Adam Lozano, male    DOB: 03/11/1971, 51 y.o.   MRN: [id number]  Patient is here today for diabetes checkup.  He is not checking his sugars but he denies any polyuria polydipsia or blurry vision.  He denies any numbness or tingling in his feet.  Diabetic foot exam was performed today and does show a thick hyperkeratotic skin with fissures and cracks on both heels.  He admits that he has not been consistent using his pumice stone.  However there are no ischemic ulcers or ulcers forming.  He has excellent pulses in both feet.  He denies any chest pain or shortness of breath.  However he does have tachycardia simply standing up to get on the exam table.  I am concerned about his overall physical conditioning.  He admits that he has been relatively sedentary due to pain in his back. Past Medical History:  Diagnosis Date   Asthma    Atopic dermatitis    Diabetes mellitus without complication (HCC)    borderline   GERD (gastroesophageal reflux disease)    Gout    Low back pain    Chronic   RLS (restless legs syndrome)    Past Surgical History:  Procedure Laterality Date   EYE SURGERY  02/24/14/15   cataracts rt eye   Current Outpatient Medications on File Prior to Visit  Medication Sig Dispense Refill   amLODipine (NORVASC) 10 MG tablet TAKE 1 TABLET (10 MG TOTAL) BY MOUTH DAILY. STOP LOSARTAN 90 tablet 3   Artificial Tear Ointment (ARTIFICIAL TEARS) ointment Place 1 application into both eyes daily.      budesonide-formoterol (SYMBICORT) 160-4.5 MCG/ACT inhaler INHALE 2 PUFFS BY MOUTH INTO THE LUNGS TWICE A DAY. 10.2 g 5   carboxymethylcellulose (REFRESH PLUS) 0.5 % SOLN Place 1 drop into both eyes 2 (two) times daily.      cetirizine (ZYRTEC) 10 MG tablet Take 10 mg by mouth daily.     clobetasol cream (TEMOVATE) 4.48 % Apply 1 application topically 2 (two) times daily.     fenofibrate 160 MG tablet Take 1 tablet (160 mg total) by mouth daily. 90 tablet 1    folic acid (FOLVITE) 1 MG tablet Take 1 mg by mouth daily.  8   HYDROcodone-acetaminophen (NORCO/VICODIN) 5-325 MG tablet Take 1 tablet by mouth every 6 (six) hours as needed for moderate pain. 30 tablet 0   hydrocortisone valerate cream (WESTCORT) 0.2 % Apply 1 application topically daily.     meloxicam (MOBIC) 7.5 MG tablet TAKE 1 TABLET BY MOUTH EVERY DAY 30 tablet 3   methotrexate (RHEUMATREX) 2.5 MG tablet Take 12.5 mg by mouth once a week. Caution:Chemotherapy. Protect from light. Take on sundays Pt takes 4 pills every Sunday to equal 10 mg     pimecrolimus (ELIDEL) 1 % cream Apply topically 2 (two) times daily.     tiZANidine (ZANAFLEX) 4 MG tablet TAKE 1 TABLET BY MOUTH EVERY 6 HOURS AS NEEDED FOR MUSCLE SPASM 360 tablet 1   No current facility-administered medications on file prior to visit.   Allergies  Allergen Reactions   Coconut (Cocos Nucifera) Hives and Itching    MAKES THROAT ITCH, ALSO   Other Hives and Itching    NO TREE NUTS   Peanut-Containing Drug Products Hives and Itching    MAKES THROAT ITCH, ALSO   Latex Rash   Social History   Socioeconomic History   Marital status: Single  Spouse name: Not on file   Number of children: 1   Years of education: Not on file   Highest education level: Not on file  Occupational History   Occupation: Disability  Tobacco Use   Smoking status: Former    Types: Cigarettes    Quit date: 11/25/2011    Years since quitting: 10.2   Smokeless tobacco: Never   Tobacco comments:    07/22/21-Pt declines low dose CT scan.  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   Mom living. 1 Brother, 1 daughter.   Social Determinants of Health   Financial Resource Strain: Low Risk    Difficulty of Paying Living Expenses: Not hard at all  Food Insecurity: No  Food Insecurity   Worried About Charity fundraiser in the Last Year: Never true   [place] in the Last Year: Never true  Transportation Needs: No Transportation Needs   Lack of Transportation (Medical): No   Lack of Transportation (Non-Medical): No  Physical Activity: Insufficiently Active   Days of Exercise per Week: 3 days   Minutes of Exercise per Session: 20 min  Stress: No Stress Concern Present   Feeling of Stress : Not at all  Social Connections: Socially Isolated   Frequency of Communication with Friends and Family: More than three times a week   Frequency of Social Gatherings with Friends and Family: More than three times a week   Attends Religious Services: Never   Marine scientist or Organizations: No   Attends Music therapist: Never   Marital Status: Never married  Human resources officer Violence: Not At Risk   Fear of Current or Ex-Partner: No   Emotionally Abused: No   Physically Abused: No   Sexually Abused: No      Review of Systems  Musculoskeletal:  Positive for back pain.  All other systems reviewed and are negative.     Objective:   Physical Exam Vitals reviewed.  Constitutional:      General: He is not in acute distress.    Appearance: Normal appearance. He is obese. He is not ill-appearing.  Cardiovascular:     Rate and Rhythm: Normal rate and regular rhythm.     Pulses: Normal pulses.     Heart sounds: Normal heart sounds. No murmur heard.   No friction rub. No gallop.  Pulmonary:     Effort: Pulmonary effort is normal. No respiratory distress.     Breath sounds: Normal breath sounds. No stridor. No wheezing, rhonchi or rales.  Chest:     Chest wall: No tenderness.  Abdominal:     General: Bowel sounds are normal.     Palpations: Abdomen is soft.     Tenderness: There is no abdominal tenderness. There is no guarding or rebound.  Musculoskeletal:     Right lower leg: No edema.     Left lower leg: No edema.  Skin:     Findings: Rash present.  Neurological:     Mental Status: He is alert.          Assessment & Plan:   Benign essential HTN  Controlled type 2 diabetes mellitus with complication, without long-term current use of insulin (HCC) - Plan: CBC with Differential/Platelet, Lipid panel, Microalbumin, urine,  Hemoglobin A1c, COMPLETE METABOLIC PANEL WITH GFR Blood pressure today is acceptable.  Check CBC CMP fasting lipid panel A1c and urine microalbumin.  Goal A1c is less than 6.5.  Goal LDL cholesterol is less than 100.  Goal albumin creatinine ratio is less than 30.  I did encourage the patient to try to increase his aerobic exercise to develop his cardiovascular conditioning.  I also asked the patient to try to be consistent using a pumice stone to help prevent fissures and cracks on his heel that could eventually cause infections.

## 2022-03-11 DIAGNOSIS — H33021 Retinal detachment with multiple breaks, right eye: Secondary | ICD-10-CM | POA: Diagnosis not present

## 2022-03-11 DIAGNOSIS — T8522XA Displacement of intraocular lens, initial encounter: Secondary | ICD-10-CM | POA: Diagnosis not present

## 2022-03-11 LAB — CBC WITH DIFFERENTIAL/PLATELET
Absolute Monocytes: 951 cells/uL — ABNORMAL HIGH (ref 200–950)
Basophils Absolute: 33 cells/uL (ref 0–200)
Basophils Relative: 0.4 %
Eosinophils Absolute: 508 cells/uL — ABNORMAL HIGH (ref 15–500)
Eosinophils Relative: 6.2 %
HCT: 41.5 % (ref 38.5–50.0)
Hemoglobin: 14 g/dL (ref 13.2–17.1)
Lymphs Abs: 1935 cells/uL (ref 850–3900)
MCH: 30.5 pg (ref 27.0–33.0)
MCHC: 33.7 g/dL (ref 32.0–36.0)
MCV: 90.4 fL (ref 80.0–100.0)
MPV: 10.4 fL (ref 7.5–12.5)
Monocytes Relative: 11.6 %
Neutro Abs: 4772 cells/uL (ref 1500–7800)
Neutrophils Relative %: 58.2 %
Platelets: 239 10*3/uL (ref 140–400)
RBC: 4.59 10*6/uL (ref 4.20–5.80)
RDW: 13.2 % (ref 11.0–15.0)
Total Lymphocyte: 23.6 %
WBC: 8.2 10*3/uL (ref 3.8–10.8)

## 2022-03-11 LAB — COMPLETE METABOLIC PANEL WITH GFR
AG Ratio: 1.9 (calc) (ref 1.0–2.5)
ALT: 18 U/L (ref 9–46)
AST: 14 U/L (ref 10–40)
Albumin: 4.4 g/dL (ref 3.6–5.1)
Alkaline phosphatase (APISO): 71 U/L (ref 36–130)
BUN: 15 mg/dL (ref 7–25)
CO2: 26 mmol/L (ref 20–32)
Calcium: 9.2 mg/dL (ref 8.6–10.3)
Chloride: 106 mmol/L (ref 98–110)
Creat: 0.82 mg/dL (ref 0.60–1.29)
Globulin: 2.3 g/dL (calc) (ref 1.9–3.7)
Glucose, Bld: 105 mg/dL — ABNORMAL HIGH (ref 65–99)
Potassium: 3.9 mmol/L (ref 3.5–5.3)
Sodium: 141 mmol/L (ref 135–146)
Total Bilirubin: 0.2 mg/dL (ref 0.2–1.2)
Total Protein: 6.7 g/dL (ref 6.1–8.1)
eGFR: 108 mL/min/{1.73_m2} (ref >=60)

## 2022-03-11 LAB — HEMOGLOBIN A1C
Hgb A1c MFr Bld: 5.7 % of total Hgb — ABNORMAL HIGH (ref <5.7)
Mean Plasma Glucose: 117 mg/dL
eAG (mmol/L): 6.5 mmol/L

## 2022-03-11 LAB — LIPID PANEL
Cholesterol: 151 mg/dL (ref <200)
HDL: 46 mg/dL (ref >=40)
LDL Cholesterol (Calc): 81 mg/dL (calc)
Non-HDL Cholesterol (Calc): 105 mg/dL (calc) (ref <130)
Total CHOL/HDL Ratio: 3.3 (calc) (ref <5.0)
Triglycerides: 137 mg/dL (ref <150)

## 2022-03-11 LAB — MICROALBUMIN, URINE: Microalb, Ur: 0.2 mg/dL

## 2022-03-17 DIAGNOSIS — H33021 Retinal detachment with multiple breaks, right eye: Secondary | ICD-10-CM | POA: Diagnosis not present

## 2022-03-22 ENCOUNTER — Other Ambulatory Visit: Payer: Self-pay | Admitting: Family Medicine

## 2022-03-22 DIAGNOSIS — M545 Low back pain, unspecified: Secondary | ICD-10-CM

## 2022-03-23 NOTE — Telephone Encounter (Signed)
LOV 03/10/22 Last refill 01/20/22, #30, 0 refills  Please review, thanks!

## 2022-03-23 NOTE — Telephone Encounter (Signed)
Requested medication (s) are due for refill today - unsure  Requested medication (s) are on the active medication list -yes  Future visit scheduled -yes  Last refill: 01/20/22 #30  Notes to clinic: non delegated Rx  Requested Prescriptions  Pending Prescriptions Disp Refills   HYDROcodone-acetaminophen (NORCO/VICODIN) 5-325 MG tablet [Pharmacy Med Name: HYDROCODONE-APAP 5-325 MG TAB] 30 tablet     Sig: TAKE ONE (1) TABLET BY MOUTH EVERY 6 HOURS AS NEEDED FOR MODERATE PAIN     Not Delegated - Analgesics:  Opioid Agonist Combinations Failed - 03/22/2022  4:50 PM      Failed - This refill cannot be delegated      Failed - Urine Drug Screen completed in last 360 days      Passed - Valid encounter within last 3 months    Recent Outpatient Visits           1 week ago Benign essential HTN   [place], Warren T, MD   6 months ago Controlled type 2 diabetes mellitus with complication, without long-term current use of insulin ([place])   [place] Pickard, Cammie Mcgee, MD   1 year ago Benign essential HTN   [place] Susy Frizzle, MD   1 year ago Controlled type 2 diabetes mellitus with complication, without long-term current use of insulin ([place])   [place] Pickard, Cammie Mcgee, MD   1 year ago Prediabetes   [place] Pickard, Cammie Mcgee, MD       Future Appointments             In 5 months Pickard, Cammie Mcgee, MD [place], [facility]                Requested Prescriptions  Pending Prescriptions Disp Refills   HYDROcodone-acetaminophen (NORCO/VICODIN) 5-325 MG tablet [Pharmacy Med Name: HYDROCODONE-APAP 5-325 MG TAB] 30 tablet     Sig: TAKE ONE (1) TABLET BY MOUTH EVERY 6 HOURS AS NEEDED FOR MODERATE PAIN     Not Delegated - Analgesics:  Opioid Agonist Combinations Failed - 03/22/2022  4:50 PM      Failed - This refill cannot be delegated      Failed - Urine Drug  Screen completed in last 360 days      Passed - Valid encounter within last 3 months    Recent Outpatient Visits           1 week ago Benign essential HTN   [place], Warren T, MD   6 months ago Controlled type 2 diabetes mellitus with complication, without long-term current use of insulin ([place])   [place] Pickard, Cammie Mcgee, MD   1 year ago Benign essential HTN   [place] Susy Frizzle, MD   1 year ago Controlled type 2 diabetes mellitus with complication, without long-term current use of insulin ([place])   [place] Pickard, Cammie Mcgee, MD   1 year ago Prediabetes   [place] Pickard, Cammie Mcgee, MD       Future Appointments             In 5 months Pickard, Cammie Mcgee, MD [place], [facility]

## 2022-04-18 ENCOUNTER — Ambulatory Visit (INDEPENDENT_AMBULATORY_CARE_PROVIDER_SITE_OTHER): Payer: Medicare Other | Admitting: *Deleted

## 2022-04-18 DIAGNOSIS — E118 Type 2 diabetes mellitus with unspecified complications: Secondary | ICD-10-CM

## 2022-04-18 DIAGNOSIS — J4520 Mild intermittent asthma, uncomplicated: Secondary | ICD-10-CM

## 2022-04-22 DIAGNOSIS — E1159 Type 2 diabetes mellitus with other circulatory complications: Secondary | ICD-10-CM

## 2022-04-22 DIAGNOSIS — I10 Essential (primary) hypertension: Secondary | ICD-10-CM

## 2022-04-22 DIAGNOSIS — J45909 Unspecified asthma, uncomplicated: Secondary | ICD-10-CM | POA: Diagnosis not present

## 2022-04-28 DIAGNOSIS — H35351 Cystoid macular degeneration, right eye: Secondary | ICD-10-CM | POA: Diagnosis not present

## 2022-04-28 DIAGNOSIS — H43812 Vitreous degeneration, left eye: Secondary | ICD-10-CM | POA: Diagnosis not present

## 2022-05-05 ENCOUNTER — Other Ambulatory Visit: Payer: Self-pay | Admitting: Family Medicine

## 2022-05-05 NOTE — Telephone Encounter (Signed)
Requested Prescriptions  Pending Prescriptions Disp Refills  . amLODipine (NORVASC) 10 MG tablet [Pharmacy Med Name: AMLODIPINE BESYLATE 10 MG TAB] 90 tablet 1    Sig: TAKE ONE TABLET (10 MG TOTAL) BY MOUTH DAILY. STOP LOSARTAN.     Cardiovascular: Calcium Channel Blockers 2 Failed - 05/05/2022 12:11 PM      Failed - Last BP in normal range    BP Readings from Last 1 Encounters:  03/10/22 (!) 130/92         Passed - Last Heart Rate in normal range    Pulse Readings from Last 1 Encounters:  03/10/22 88         Passed - Valid encounter within last 6 months    Recent Outpatient Visits          1 month ago Benign essential HTN   [place] Susy Frizzle, MD   7 months ago Controlled type 2 diabetes mellitus with complication, without long-term current use of insulin ([place])   [place] Pickard, Cammie Mcgee, MD   1 year ago Benign essential HTN   [place] Susy Frizzle, MD   1 year ago Controlled type 2 diabetes mellitus with complication, without long-term current use of insulin ([place])   [place] Pickard, Cammie Mcgee, MD   1 year ago Prediabetes   [place] Pickard, Cammie Mcgee, MD      Future Appointments            In 4 months Pickard, Cammie Mcgee, MD [place], [facility]           . meloxicam (MOBIC) 7.5 MG tablet [Pharmacy Med Name: MELOXICAM 7.5 MG TAB] 90 tablet 3    Sig: TAKE ONE (1) TABLET BY MOUTH EVERY DAY     Analgesics:  COX2 Inhibitors Failed - 05/05/2022 12:11 PM      Failed - Manual Review: Labs are only required if the patient has taken medication for more than 8 weeks.      Passed - HGB in normal range and within 360 days    Hemoglobin  Date Value Ref Range Status  03/10/2022 14.0 13.2 - 17.1 g/dL Final         Passed - Cr in normal range and within 360 days    Creat  Date Value Ref Range Status  03/10/2022 0.82 0.60 - 1.29 mg/dL Final          Passed - HCT in normal range and within 360 days    HCT  Date Value Ref Range Status  03/10/2022 41.5 38.5 - 50.0 % Final         Passed - AST in normal range and within 360 days    AST  Date Value Ref Range Status  03/10/2022 14 10 - 40 U/L Final         Passed - ALT in normal range and within 360 days    ALT  Date Value Ref Range Status  03/10/2022 18 9 - 46 U/L Final         Passed - eGFR is 30 or above and within 360 days    GFR, Est African American  Date Value Ref Range Status  03/09/2021 119 > OR = 60 mL/min/1.88m2 Final   GFR, Est Non African American  Date Value Ref Range Status  03/09/2021 103 > OR = 60 mL/min/1.74m2 Final   eGFR  Date Value Ref Range  Status  03/10/2022 108 > OR = 60 mL/min/1.37m2 Final    Comment:    The eGFR is based on the CKD-EPI 2021 equation. To calculate  the new eGFR from a previous Creatinine or Cystatin C result, go to https://www.kidney.org/professionals/ kdoqi/gfr%5Fcalculator          Passed - Patient is not pregnant      Passed - Valid encounter within last 12 months    Recent Outpatient Visits          1 month ago Benign essential HTN   [place] Pickard, Cammie Mcgee, MD   7 months ago Controlled type 2 diabetes mellitus with complication, without long-term current use of insulin ([place])   [place] Pickard, Cammie Mcgee, MD   1 year ago Benign essential HTN   [place] Susy Frizzle, MD   1 year ago Controlled type 2 diabetes mellitus with complication, without long-term current use of insulin ([place])   [place] Pickard, Cammie Mcgee, MD   1 year ago Prediabetes   [place] Pickard, Cammie Mcgee, MD      Future Appointments            In 4 months Pickard, Cammie Mcgee, MD [place]

## 2022-05-17 DIAGNOSIS — Z79899 Other long term (current) drug therapy: Secondary | ICD-10-CM | POA: Diagnosis not present

## 2022-05-17 DIAGNOSIS — L2089 Other atopic dermatitis: Secondary | ICD-10-CM | POA: Diagnosis not present

## 2022-06-06 ENCOUNTER — Other Ambulatory Visit: Payer: Self-pay

## 2022-06-06 DIAGNOSIS — M545 Low back pain, unspecified: Secondary | ICD-10-CM

## 2022-06-06 MED ORDER — HYDROCODONE-ACETAMINOPHEN 5-325 MG PO TABS: ORAL_TABLET | ORAL | 0 refills | Status: DC

## 2022-06-06 NOTE — Telephone Encounter (Signed)
LOV 03/10/22 Last refill 03/24/22, #30, 0 refills  Please review, thanks!

## 2022-06-09 DIAGNOSIS — H35351 Cystoid macular degeneration, right eye: Secondary | ICD-10-CM | POA: Diagnosis not present

## 2022-07-07 ENCOUNTER — Other Ambulatory Visit: Payer: Self-pay | Admitting: Family Medicine

## 2022-07-08 NOTE — Telephone Encounter (Signed)
Requested Prescriptions  Pending Prescriptions Disp Refills  . budesonide-formoterol (SYMBICORT) 160-4.5 MCG/ACT inhaler [Pharmacy Med Name: SYMBICORT 160-4.5 MCG/ACT INH AERO] 10.2 g 5    Sig: INHALE 2 PUFFS BY MOUTH INTO THE LUNGS TWICE A DAY.     Pulmonology:  Combination Products Passed - 07/07/2022  1:26 PM      Passed - Valid encounter within last 12 months    Recent Outpatient Visits          4 months ago Benign essential HTN   [place] Pickard, Cammie Mcgee, MD   10 months ago Controlled type 2 diabetes mellitus with complication, without long-term current use of insulin ([place])   [place] Pickard, Cammie Mcgee, MD   1 year ago Benign essential HTN   [place] Susy Frizzle, MD   1 year ago Controlled type 2 diabetes mellitus with complication, without long-term current use of insulin ([place])   [place] Pickard, Cammie Mcgee, MD   2 years ago Prediabetes   [place] Pickard, Cammie Mcgee, MD      Future Appointments            In 2 months Pickard, Cammie Mcgee, MD [place]

## 2022-07-25 ENCOUNTER — Encounter: Payer: Self-pay | Admitting: Family Medicine

## 2022-07-26 ENCOUNTER — Other Ambulatory Visit: Payer: Self-pay | Admitting: Family Medicine

## 2022-07-26 MED ORDER — TIZANIDINE HCL 2 MG PO CAPS: 2.0000 mg | ORAL_CAPSULE | Freq: Three times a day (TID) | ORAL | 0 refills | Status: DC

## 2022-07-26 MED ORDER — AMLODIPINE BESYLATE 5 MG PO TABS: 5.0000 mg | ORAL_TABLET | Freq: Every day | ORAL | 3 refills | Status: DC

## 2022-08-04 DIAGNOSIS — H33021 Retinal detachment with multiple breaks, right eye: Secondary | ICD-10-CM | POA: Diagnosis not present

## 2022-08-04 DIAGNOSIS — H35351 Cystoid macular degeneration, right eye: Secondary | ICD-10-CM | POA: Diagnosis not present

## 2022-08-04 DIAGNOSIS — T8522XA Displacement of intraocular lens, initial encounter: Secondary | ICD-10-CM | POA: Diagnosis not present

## 2022-08-04 DIAGNOSIS — H59811 Chorioretinal scars after surgery for detachment, right eye: Secondary | ICD-10-CM | POA: Diagnosis not present

## 2022-08-22 ENCOUNTER — Other Ambulatory Visit: Payer: Self-pay | Admitting: Family Medicine

## 2022-08-22 ENCOUNTER — Telehealth: Payer: Self-pay

## 2022-08-22 DIAGNOSIS — M545 Low back pain, unspecified: Secondary | ICD-10-CM

## 2022-08-22 MED ORDER — HYDROCODONE-ACETAMINOPHEN 5-325 MG PO TABS: ORAL_TABLET | ORAL | 0 refills | Status: AC

## 2022-08-22 NOTE — Telephone Encounter (Signed)
MEDICATION REFILL:  Pt called requesting a refill on his Hydrocodone. Pt uses [facility]. Last RF 06/06/22. Last OV 03/10/22. Thank you.

## 2022-08-23 ENCOUNTER — Telehealth: Payer: Self-pay | Admitting: Family Medicine

## 2022-08-23 NOTE — Telephone Encounter (Signed)
Left message to return call; need to reschedule appt. Dr. Dennard Schaumann on vacation week of 09/12/22.

## 2022-08-24 DIAGNOSIS — L2089 Other atopic dermatitis: Secondary | ICD-10-CM | POA: Diagnosis not present

## 2022-08-24 DIAGNOSIS — Z79899 Other long term (current) drug therapy: Secondary | ICD-10-CM | POA: Diagnosis not present

## 2022-09-12 ENCOUNTER — Ambulatory Visit: Payer: Self-pay | Admitting: Family Medicine

## 2022-09-19 ENCOUNTER — Ambulatory Visit (INDEPENDENT_AMBULATORY_CARE_PROVIDER_SITE_OTHER): Payer: Medicare Other | Admitting: Family Medicine

## 2022-09-19 VITALS — BP 156/102 | HR 106 | Ht 66.0 in | Wt 178.2 lb

## 2022-09-19 DIAGNOSIS — Z1211 Encounter for screening for malignant neoplasm of colon: Secondary | ICD-10-CM | POA: Diagnosis not present

## 2022-09-19 DIAGNOSIS — E118 Type 2 diabetes mellitus with unspecified complications: Secondary | ICD-10-CM

## 2022-09-19 DIAGNOSIS — I10 Essential (primary) hypertension: Secondary | ICD-10-CM

## 2022-09-19 NOTE — Progress Notes (Signed)
Subjective:    Patient ID: Adam Lozano, male    DOB: [birth year], 51 y.o.   MRN: [id number] Patient is here today for a follow-up of his hypertension and his diabetes.  He is always very absolutely.  He states that he did smoke marijuana this morning.  He states that whenever he smokes marijuana, his heart rate is elevated he feels that it runs his blood pressure higher.  Today his blood pressure is extremely high at 156/102.  He denies any chest pain shortness of breath or dyspnea on exertion.  He has been checking his blood pressure at home with his monitor his blood pressures typically 110-120/80.  In fact we recently reduced his amlodipine from 10 mg a day to 5 mg a day due to hypotension.  He denies any polyuria polydipsia or blurry vision.  He does have a severe psoriasis outbreak all over his body however he states that is starting to improve.  He defers any treatment at present to his dermatologist.  Otherwise doing well  Past Medical History:  Diagnosis Date   Asthma    Atopic dermatitis    Diabetes mellitus without complication ([place])    borderline   GERD (gastroesophageal reflux disease)    Gout    Low back pain    Chronic   RLS (restless legs syndrome)    Past Surgical History:  Procedure Laterality Date   EYE SURGERY  02/24/14/15   cataracts rt eye   Current Outpatient Medications on File Prior to Visit  Medication Sig Dispense Refill   PROLENSA 0.07 % SOLN Place 1 drop into the right eye at bedtime.     amLODipine (NORVASC) 5 MG tablet Take 1 tablet (5 mg total) by mouth daily. 90 tablet 3   Artificial Tear Ointment (ARTIFICIAL TEARS) ointment Place 1 application into both eyes daily.      budesonide-formoterol (SYMBICORT) 160-4.5 MCG/ACT inhaler INHALE 2 PUFFS BY MOUTH INTO THE LUNGS TWICE A DAY. 10.2 g 5   carboxymethylcellulose (REFRESH PLUS) 0.5 % SOLN Place 1 drop into both eyes 2 (two) times daily.      cetirizine (ZYRTEC) 10 MG tablet Take 10 mg by mouth daily.      clobetasol cream (TEMOVATE) 2.29 % Apply 1 application topically 2 (two) times daily.     fenofibrate 160 MG tablet Take 1 tablet (160 mg total) by mouth daily. (Patient not taking: Reported on 03/10/2022) 90 tablet 1   folic acid (FOLVITE) 1 MG tablet Take 1 mg by mouth daily.  8   HYDROcodone-acetaminophen (NORCO/VICODIN) 5-325 MG tablet TAKE ONE (1) TABLET BY MOUTH EVERY 6 HOURS AS NEEDED FOR MODERATE PAIN 30 tablet 0   hydrocortisone valerate cream (WESTCORT) 0.2 % Apply 1 application topically daily.     meloxicam (MOBIC) 7.5 MG tablet TAKE ONE (1) TABLET BY MOUTH EVERY DAY 90 tablet 3   methotrexate (RHEUMATREX) 2.5 MG tablet Take 12.5 mg by mouth once a week. Caution:Chemotherapy. Protect from light. Take on sundays Pt takes 4 pills every Sunday to equal 10 mg     pimecrolimus (ELIDEL) 1 % cream Apply topically 2 (two) times daily.     tizanidine (ZANAFLEX) 2 MG capsule Take 1 capsule (2 mg total) by mouth 3 (three) times daily. 360 capsule 0   No current facility-administered medications on file prior to visit.   Allergies  Allergen Reactions   Coconut (Cocos Nucifera) Hives and Itching    MAKES THROAT ITCH, ALSO   Other Hives  and Itching    NO TREE NUTS   Peanut-Containing Drug Products Hives and Itching    MAKES THROAT ITCH, ALSO   Latex Rash   Social History   Socioeconomic History   Marital status: Single    Spouse name: Not on file   Number of children: 1   Years of education: Not on file   Highest education level: Not on file  Occupational History   Occupation: Disability  Tobacco Use   Smoking status: Former    Types: Cigarettes    Quit date: 11/25/2011    Years since quitting: 10.8   Smokeless tobacco: Never   Tobacco comments:    07/22/21-Pt declines low dose CT scan.  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   Mom living. 1 Brother, 1 daughter.   Social Determinants of Health   Financial Resource Strain: Low Risk  (07/22/2021)   Overall Financial Resource Strain (CARDIA)    Difficulty of Paying Living Expenses: Not hard at all  Food Insecurity: No Food Insecurity (07/22/2021)   Hunger Vital Sign    Worried About Running Out of Food in the Last Year: Never true    Ran Out of Food in the Last Year: Never true  Transportation Needs: No Transportation Needs (07/22/2021)   PRAPARE - Hydrologist (Medical): No    Lack of Transportation (Non-Medical): No  Physical Activity: Insufficiently Active (07/22/2021)   Exercise Vital Sign    Days of Exercise per Week: 3 days    Minutes of Exercise per Session: 20 min  Stress: No Stress Concern Present (07/22/2021)   [place]    Feeling of Stress : Not at all  Social Connections: Socially Isolated (07/22/2021)   Social Connection and Isolation Panel [NHANES]    Frequency of Communication with Friends and Family: More than three times a week    Frequency of Social Gatherings with Friends and Family: More than three times a week    Attends Religious Services: Never    Marine scientist or Organizations: No    Attends Archivist Meetings: Never    Marital Status: Never married  Intimate Partner Violence: Not At Risk (07/22/2021)   Humiliation, Afraid, Rape, and Kick questionnaire    Fear of Current or Ex-Partner: No    Emotionally Abused: No    Physically Abused: No    Sexually Abused: No      Review of Systems  Musculoskeletal:  Positive for back pain.  All other systems reviewed and are negative.      Objective:   Physical Exam Vitals reviewed.  Constitutional:      General: He is not in acute distress.    Appearance: Normal appearance. He is obese. He is not ill-appearing.  Cardiovascular:     Rate  and Rhythm: Regular rhythm. Tachycardia present.     Pulses: Normal pulses.     Heart sounds: Normal heart sounds. No murmur heard.    No friction rub. No gallop.  Pulmonary:     Effort: Pulmonary effort is normal. No respiratory distress.     Breath sounds: Normal breath sounds. No stridor. No wheezing, rhonchi or rales.  Chest:     Chest  wall: No tenderness.  Abdominal:     General: Bowel sounds are normal.     Palpations: Abdomen is soft.     Tenderness: There is no abdominal tenderness. There is no guarding or rebound.  Musculoskeletal:     Right lower leg: No edema.     Left lower leg: No edema.  Skin:    Findings: Rash present.  Neurological:     Mental Status: He is alert.           Assessment & Plan:  Controlled type 2 diabetes mellitus with complication, without long-term current use of insulin (HCC) - Plan: Hemoglobin A1c, Lipid panel, COMPLETE METABOLIC PANEL WITH GFR, Protein / Creatinine Ratio, Urine  Benign essential HTN  Colon cancer screening - Plan: Cologuard My biggest concern is his blood pressure.  I asked him to check his blood pressure twice a day for the next week and reviewed the numbers with me.  If his blood pressures greater than 140/90, I would increase his amlodipine to 10 mg a day.  Meanwhile check CMP, lipid panel, and an A1c along with a urine protein to creatinine ratio.  I like his A1c to be less than 6.5 and his LDL cholesterol to be under 100.

## 2022-09-20 LAB — PROTEIN / CREATININE RATIO, URINE
Creatinine, Urine: 257 mg/dL (ref 20–320)
Protein/Creat Ratio: 105 mg/g creat (ref 25–148)
Protein/Creatinine Ratio: 0.105 mg/mg creat (ref 0.025–0.148)
Total Protein, Urine: 27 mg/dL — ABNORMAL HIGH (ref 5–25)

## 2022-09-20 LAB — COMPLETE METABOLIC PANEL WITH GFR
AG Ratio: 1.8 (calc) (ref 1.0–2.5)
ALT: 20 U/L (ref 9–46)
AST: 18 U/L (ref 10–40)
Albumin: 4.7 g/dL (ref 3.6–5.1)
Alkaline phosphatase (APISO): 110 U/L (ref 36–130)
BUN: 12 mg/dL (ref 7–25)
CO2: 29 mmol/L (ref 20–32)
Calcium: 9.8 mg/dL (ref 8.6–10.3)
Chloride: 103 mmol/L (ref 98–110)
Creat: 0.8 mg/dL (ref 0.60–1.29)
Globulin: 2.6 g/dL (calc) (ref 1.9–3.7)
Glucose, Bld: 104 mg/dL — ABNORMAL HIGH (ref 65–99)
Potassium: 4.5 mmol/L (ref 3.5–5.3)
Sodium: 140 mmol/L (ref 135–146)
Total Bilirubin: 0.4 mg/dL (ref 0.2–1.2)
Total Protein: 7.3 g/dL (ref 6.1–8.1)
eGFR: 108 mL/min/{1.73_m2} (ref >=60)

## 2022-09-20 LAB — HEMOGLOBIN A1C
Hgb A1c MFr Bld: 5.8 % of total Hgb — ABNORMAL HIGH (ref <5.7)
Mean Plasma Glucose: 120 mg/dL
eAG (mmol/L): 6.6 mmol/L

## 2022-09-20 LAB — LIPID PANEL
Cholesterol: 176 mg/dL (ref <200)
HDL: 51 mg/dL (ref >=40)
LDL Cholesterol (Calc): 103 mg/dL (calc) — ABNORMAL HIGH
Non-HDL Cholesterol (Calc): 125 mg/dL (calc) (ref <130)
Total CHOL/HDL Ratio: 3.5 (calc) (ref <5.0)
Triglycerides: 120 mg/dL (ref <150)

## 2022-09-22 DIAGNOSIS — L2089 Other atopic dermatitis: Secondary | ICD-10-CM | POA: Diagnosis not present

## 2022-09-27 DIAGNOSIS — Z1211 Encounter for screening for malignant neoplasm of colon: Secondary | ICD-10-CM | POA: Diagnosis not present

## 2022-10-04 LAB — COLOGUARD: COLOGUARD: NEGATIVE

## 2022-10-06 ENCOUNTER — Ambulatory Visit (INDEPENDENT_AMBULATORY_CARE_PROVIDER_SITE_OTHER): Payer: Medicare Other

## 2022-10-06 VITALS — Ht 66.0 in | Wt 178.0 lb

## 2022-10-06 DIAGNOSIS — Z0000 Encounter for general adult medical examination without abnormal findings: Secondary | ICD-10-CM | POA: Diagnosis not present

## 2022-10-06 NOTE — Progress Notes (Signed)
Subjective:   Adam Lozano is a 51 y.o. male who presents for Medicare Annual/Subsequent preventive examination.  I connected with  Lawrence Marseilles on 10/06/22 by a audio enabled telemedicine application and verified that I am speaking with the correct person using two identifiers.  Patient Location: Home  Provider Location: Office/Clinic  I discussed the limitations of evaluation and management by telemedicine. The patient expressed understanding and agreed to proceed.  Review of Systems     Cardiac Risk Factors include: male gender;hypertension;smoking/ tobacco exposure     Objective:    Today's Vitals   10/06/22 1452  Weight: 178 lb (80.7 kg)  Height: _0  (1.676 m)   Body mass index is 28.73 kg/m.     10/06/2022    2:56 PM 07/22/2021    9:13 AM 06/14/2021   10:03 AM 04/01/2016   12:51 PM 06/28/2014    6:33 PM  Advanced Directives  Does Patient Have a Medical Advance Directive? _1   Would patient like information on creating a medical advance directive? Yes (MAU/Ambulatory/Procedural Areas - Information given) No - Patient declined No - Patient declined  No - patient declined information    Current Medications (verified) Outpatient Encounter Medications as of 10/06/2022  Medication Sig   amLODipine (NORVASC) 5 MG tablet Take 1 tablet (5 mg total) by mouth daily.   Artificial Tear Ointment (ARTIFICIAL TEARS) ointment Place 1 application into both eyes daily.    budesonide-formoterol (SYMBICORT) 160-4.5 MCG/ACT inhaler INHALE 2 PUFFS BY MOUTH INTO THE LUNGS TWICE A DAY.   carboxymethylcellulose (REFRESH PLUS) 0.5 % SOLN Place 1 drop into both eyes 2 (two) times daily.    cetirizine (ZYRTEC) 10 MG tablet Take 10 mg by mouth daily.   HYDROcodone-acetaminophen (NORCO/VICODIN) 5-325 MG tablet TAKE ONE (1) TABLET BY MOUTH EVERY 6 HOURS AS NEEDED FOR MODERATE PAIN   hydrocortisone valerate cream (WESTCORT) 0.2 % Apply 1 application topically daily.   meloxicam  (MOBIC) 7.5 MG tablet TAKE ONE (1) TABLET BY MOUTH EVERY DAY   methotrexate (RHEUMATREX) 2.5 MG tablet Take 12.5 mg by mouth once a week. Caution:Chemotherapy. Protect from light. Take on sundays Pt takes 4 pills every Sunday to equal 10 mg   PROLENSA 0.07 % SOLN Place 1 drop into the right eye at bedtime.   tizanidine (ZANAFLEX) 2 MG capsule Take 1 capsule (2 mg total) by mouth 3 (three) times daily.   No facility-administered encounter medications on file as of 10/06/2022.    Allergies (verified) Coconut (cocos nucifera), Other, Peanut-containing drug products, and Latex   History: Past Medical History:  Diagnosis Date   Asthma    Atopic dermatitis    Diabetes mellitus without complication (HCC)    borderline   GERD (gastroesophageal reflux disease)    Gout    Low back pain    Chronic   RLS (restless legs syndrome)    Past Surgical History:  Procedure Laterality Date   EYE SURGERY  02/24/14/15   cataracts rt eye   History reviewed. No pertinent family history. Social History   Socioeconomic History   Marital status: Single    Spouse name: Not on file   Number of children: 1   Years of education: Not on file   Highest education level: Not on file  Occupational History   Occupation: Disability  Tobacco Use   Smoking status: Former    Types: Cigarettes    Quit date: 11/25/2011    Years since quitting: 40.8  Smokeless tobacco: Never   Tobacco comments:    07/22/21-Pt declines low dose CT scan.  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   Mom living. 1 Brother, 1 daughter.   Social Determinants of Health   Financial Resource Strain: Low Risk  (10/06/2022)   Overall Financial Resource Strain (CARDIA)    Difficulty of Paying Living Expenses: Not hard at all  Food Insecurity: No Food  Insecurity (10/06/2022)   Hunger Vital Sign    Worried About Running Out of Food in the Last Year: Never true    Ran Out of Food in the Last Year: Never true  Transportation Needs: No Transportation Needs (10/06/2022)   PRAPARE - Hydrologist (Medical): No    Lack of Transportation (Non-Medical): No  Physical Activity: Insufficiently Active (10/06/2022)   Exercise Vital Sign    Days of Exercise per Week: 3 days    Minutes of Exercise per Session: 30 min  Stress: No Stress Concern Present (10/06/2022)   [place]    Feeling of Stress : Not at all  Social Connections: Socially Isolated (10/06/2022)   Social Connection and Isolation Panel [NHANES]    Frequency of Communication with Friends and Family: More than three times a week    Frequency of Social Gatherings with Friends and Family: More than three times a week    Attends Religious Services: Never    Marine scientist or Organizations: No    Attends Archivist Meetings: Never    Marital Status: Never married    Tobacco Counseling Counseling given: Not Answered Tobacco comments: 07/22/21-Pt declines low dose CT scan.   Clinical Intake:  Pre-visit preparation completed: Yes  Pain : No/denies pain  Diabetes: Yes CBG done?: No Did pt. bring in CBG monitor from home?: No  How often do you need to have someone help you when you read instructions, pamphlets, or other written materials from your doctor or pharmacy?: 1 - Never  Diabetic?Yes    Interpreter Needed?: No  Information entered by :: Denman Anquan LPN   Activities of Daily Living    10/06/2022    2:55 PM  In your present state of health, do you have any difficulty performing the following activities:  Hearing? 0  Vision? 0  Difficulty concentrating or making decisions? 0  Walking or climbing stairs? 0  Dressing or bathing? 0  Doing errands,  shopping? 0  Preparing Food and eating ? N  Using the Toilet? N  In the past six months, have you accidently leaked urine? N  Do you have problems with loss of bowel control? N  Managing your Medications? N  Managing your Finances? N  Housekeeping or managing your Housekeeping? N    Patient Care Team: Susy Frizzle, MD as PCP - General (Family Medicine)  Indicate any recent Medical Services you may have received from other than [facility] providers in the past year (date may be approximate).     Assessment:   This is a routine wellness examination for [place].  Hearing/Vision screen Hearing Screening - Comments:: No concerns noted  Vision Screening - Comments:: up to date with routine eye exams with Dr. Iona Hansen   Dietary issues and exercise  activities discussed: Current Exercise Habits: The patient does not participate in regular exercise at present   Goals Addressed             This Visit's Progress    Exercise 3x per week (30 min per time)   Not on track    Increase exercise.      Depression Screen    10/06/2022    3:15 PM 09/19/2022    8:22 AM 03/10/2022    8:29 AM 07/22/2021    9:07 AM 06/14/2021   10:04 AM 06/14/2021    9:47 AM 03/09/2021    8:10 AM  PHQ 2/9 Scores  PHQ - 2 Score 0 0 0 0 0 0 0  PHQ- 9 Score   0        Fall Risk    10/06/2022    2:53 PM 09/19/2022    8:22 AM 03/10/2022    8:29 AM 07/22/2021    9:14 AM 06/14/2021    9:48 AM  Fall Risk   Falls in the past year? 0 0 0 0 0  Number falls in past yr: 0 0 0 0   Injury with Fall? 0 0 0 0   Risk for fall due to : No Fall Risks No Fall Risks  No Fall Risks   Follow up Falls evaluation completed;Education provided;Falls prevention discussed Falls prevention discussed  Falls prevention discussed     FALL RISK PREVENTION PERTAINING TO THE HOME:  Any stairs in or around the home? Yes  If so, are there any without handrails? No  Home free of loose throw rugs in walkways, pet beds, electrical cords,  etc? Yes  Adequate lighting in your home to reduce risk of falls? Yes   ASSISTIVE DEVICES UTILIZED TO PREVENT FALLS:  Life alert? No  Use of a cane, walker or w/c? No  Grab bars in the bathroom? No  Shower chair or bench in shower? No  Elevated toilet seat or a handicapped toilet? No   TIMED UP AND GO:  Was the test performed? No .  Length of time to ambulate 10 feet: telephonic visit    Cognitive Function:        10/06/2022    2:55 PM 07/22/2021    9:16 AM  6CIT Screen  What Year? 0 points 0 points  What month? 0 points 0 points  What time? 0 points 0 points  Count back from 20 0 points 0 points  Months in reverse 0 points 2 points  Repeat phrase 0 points 0 points  Total Score 0 points 2 points    Immunizations Immunization History  Administered Date(s) Administered   Influenza Inj Mdck Quad Pf 08/14/2017, 07/17/2018   Influenza Split 08/19/2011   Influenza Whole 08/22/2007, 07/24/2010, 08/22/2012   Influenza, Quadrivalent, Recombinant, Inj, Pf 08/30/2019   Influenza,inj,Quad PF,6+ Mos 08/26/2015   Influenza-Unspecified 08/05/2014, 08/03/2016, 08/14/2017   Pneumococcal Polysaccharide-23 04/13/2005, 11/30/2018   Td 04/13/2005   Tdap 10/21/2015    TDAP status: Up to date  Flu Vaccine status: Declined, Education has been provided regarding the importance of this vaccine but patient still declined. Advised may receive this vaccine at local pharmacy or Health Dept. Aware to provide a copy of the vaccination record if obtained from local pharmacy or Health Dept. Verbalized acceptance and understanding.  Pneumococcal vaccine status: Up to date  Covid-19 vaccine status: Information provided on how to obtain vaccines.   Qualifies for Shingles Vaccine? No     Screening Tests Health  Maintenance  Topic Date Due   INFLUENZA VACCINE  01/22/2023 (Originally 05/24/2022)   COLONOSCOPY (Pts 45-73yr Insurance coverage will need to be confirmed)  09/20/2023 (Originally  10/12/2017)   Hepatitis C Screening  09/20/2023 (Originally 10/12/1990)   HIV Screening  09/20/2023 (Originally 10/13/1987)   Diabetic kidney evaluation - eGFR measurement  09/20/2023   Diabetic kidney evaluation - Urine ACR  09/20/2023   Medicare Annual Wellness (AWV)  10/07/2023   DTaP/Tdap/Td (3 - Td or Tdap) 10/20/2025   HPV VACCINES  Aged Out   COVID-19 Vaccine  Discontinued    Health Maintenance  There are no preventive care reminders to display for this patient.  Colorectal cancer screening: Type of screening: Cologuard. Completed 09/27/22. Repeat every 3 years  Lung Cancer Screening: (Low Dose CT Chest recommended if Age 273-80years, 30 pack-year currently smoking OR have quit w/in 15years.) does not qualify.   Lung Cancer Screening Referral: n/a  Additional Screening:  Hepatitis C Screening: does qualify; Completed at next office visit   Vision Screening: Recommended annual ophthalmology exams for early detection of glaucoma and other disorders of the eye. Is the patient up to date with their annual eye exam?  Yes  Who is the provider or what is the name of the office in which the patient attends annual eye exams? Dr. HIona Hansen If pt is not established with a provider, would they like to be referred to a provider to establish care? No .   Dental Screening: Recommended annual dental exams for proper oral hygiene  Community Resource Referral / Chronic Care Management: CRR required this visit?  No   CCM required this visit?  No      Plan:     I have personally reviewed and noted the following in the patient's chart:   Medical and social history Use of alcohol, tobacco or illicit drugs  Current medications and supplements including opioid prescriptions. Patient is not currently taking opioid prescriptions. Functional ability and status Nutritional status Physical activity Advanced directives List of other physicians Hospitalizations, surgeries, and ER visits in  previous 12 months Vitals Screenings to include cognitive, depression, and falls Referrals and appointments  In addition, I have reviewed and discussed with patient certain preventive protocols, quality metrics, and best practice recommendations. A written personalized care plan for preventive services as well as general preventive health recommendations were provided to patient.     SVanetta Mulders LPN   [id number]  Due to this being a virtual visit, the after visit summary with patients personalized plan was offered to patient via mail or my-chart. Patient would like to access on my-chart  Nurse Notes: Patient inquiring on having additional labs drawn; will route as a telephone message.

## 2022-10-06 NOTE — Patient Instructions (Signed)
Adam Lozano , Thank you for taking time to come for your Medicare Wellness Visit. I appreciate your ongoing commitment to your health goals. Please review the following plan we discussed and let me know if I can assist you in the future.   These are the goals we discussed:  Goals      Exercise 3x per week (30 min per time)     Increase exercise.        This is a list of the screening recommended for you and due dates:  Health Maintenance  Topic Date Due   Flu Shot  01/22/2023*   Colon Cancer Screening  09/20/2023*   Hepatitis C Screening: USPSTF Recommendation to screen - Ages 18-79 yo.  09/20/2023*   HIV Screening  09/20/2023*   Yearly kidney function blood test for diabetes  09/20/2023   Yearly kidney health urinalysis for diabetes  09/20/2023   Medicare Annual Wellness Visit  10/07/2023   DTaP/Tdap/Td vaccine (3 - Td or Tdap) 10/20/2025   HPV Vaccine  Aged Out   COVID-19 Vaccine  Discontinued  *Topic was postponed. The date shown is not the original due date.    Advanced directives: Advance directive discussed with you today. I have provided a copy for you to complete at home and have notarized. Once this is complete please bring a copy in to our office so we can scan it into your chart.   Conditions/risks identified: Aim for 30 minutes of exercise or brisk walking, 6-8 glasses of water, and 5 servings of fruits and vegetables each day.   Next appointment: Follow up in one year for your annual wellness visit   Preventive Care 40-64 Years, Male Preventive care refers to lifestyle choices and visits with your health care provider that can promote health and wellness. What does preventive care include? A yearly physical exam. This is also called an annual well check. Dental exams once or twice a year. Routine eye exams. Ask your health care provider how often you should have your eyes checked. Personal lifestyle choices, including: Daily care of your teeth and gums. Regular  physical activity. Eating a healthy diet. Avoiding tobacco and drug use. Limiting alcohol use. Practicing safe sex. Taking low-dose aspirin every day starting at age 35. What happens during an annual well check? The services and screenings done by your health care provider during your annual well check will depend on your age, overall health, lifestyle risk factors, and family history of disease. Counseling  Your health care provider may ask you questions about your: Alcohol use. Tobacco use. Drug use. Emotional well-being. Home and relationship well-being. Sexual activity. Eating habits. Work and work Statistician. Screening  You may have the following tests or measurements: Height, weight, and BMI. Blood pressure. Lipid and cholesterol levels. These may be checked every 5 years, or more frequently if you are over 49 years old. Skin check. Lung cancer screening. You may have this screening every year starting at age 64 if you have a 30-pack-year history of smoking and currently smoke or have quit within the past 15 years. Fecal occult blood test (FOBT) of the stool. You may have this test every year starting at age 22. Flexible sigmoidoscopy or colonoscopy. You may have a sigmoidoscopy every 5 years or a colonoscopy every 10 years starting at age 53. Prostate cancer screening. Recommendations will vary depending on your family history and other risks. Hepatitis C blood test. Hepatitis B blood test. Sexually transmitted disease (STD) testing. Diabetes screening. This  is done by checking your blood sugar (glucose) after you have not eaten for a while (fasting). You may have this done every 1-3 years. Discuss your test results, treatment options, and if necessary, the need for more tests with your health care provider. Vaccines  Your health care provider may recommend certain vaccines, such as: Influenza vaccine. This is recommended every year. Tetanus, diphtheria, and acellular  pertussis (Tdap, Td) vaccine. You may need a Td booster every 10 years. Zoster vaccine. You may need this after age 60. Pneumococcal 13-valent conjugate (PCV13) vaccine. You may need this if you have certain conditions and have not been vaccinated. Pneumococcal polysaccharide (PPSV23) vaccine. You may need one or two doses if you smoke cigarettes or if you have certain conditions. Talk to your health care provider about which screenings and vaccines you need and how often you need them. This information is not intended to replace advice given to you by your health care provider. Make sure you discuss any questions you have with your health care provider. Document Released: 11/06/2015 Document Revised: 06/29/2016 Document Reviewed: 08/11/2015 Elsevier Interactive Patient Education  2017 [employer] Prevention in the Home Falls can cause injuries. They can happen to people of all ages. There are many things you can do to make your home safe and to help prevent falls. What can I do on the outside of my home? Regularly fix the edges of walkways and driveways and fix any cracks. Remove anything that might make you trip as you walk through a door, such as a raised step or threshold. Trim any bushes or trees on the path to your home. Use bright outdoor lighting. Clear any walking paths of anything that might make someone trip, such as rocks or tools. Regularly check to see if handrails are loose or broken. Make sure that both sides of any steps have handrails. Any raised decks and porches should have guardrails on the edges. Have any leaves, snow, or ice cleared regularly. Use sand or salt on walking paths during winter. Clean up any spills in your garage right away. This includes oil or grease spills. What can I do in the bathroom? Use night lights. Install grab bars by the toilet and in the tub and shower. Do not use towel bars as grab bars. Use non-skid mats or decals in the tub or  shower. If you need to sit down in the shower, use a plastic, non-slip stool. Keep the floor dry. Clean up any water that spills on the floor as soon as it happens. Remove soap buildup in the tub or shower regularly. Attach bath mats securely with double-sided non-slip rug tape. Do not have throw rugs and other things on the floor that can make you trip. What can I do in the bedroom? Use night lights. Make sure that you have a light by your bed that is easy to reach. Do not use any sheets or blankets that are too big for your bed. They should not hang down onto the floor. Have a firm chair that has side arms. You can use this for support while you get dressed. Do not have throw rugs and other things on the floor that can make you trip. What can I do in the kitchen? Clean up any spills right away. Avoid walking on wet floors. Keep items that you use a lot in easy-to-reach places. If you need to reach something above you, use a strong step stool that has a grab bar.  Keep electrical cords out of the way. Do not use floor polish or wax that makes floors slippery. If you must use wax, use non-skid floor wax. Do not have throw rugs and other things on the floor that can make you trip. What can I do with my stairs? Do not leave any items on the stairs. Make sure that there are handrails on both sides of the stairs and use them. Fix handrails that are broken or loose. Make sure that handrails are as long as the stairways. Check any carpeting to make sure that it is firmly attached to the stairs. Fix any carpet that is loose or worn. Avoid having throw rugs at the top or bottom of the stairs. If you do have throw rugs, attach them to the floor with carpet tape. Make sure that you have a light switch at the top of the stairs and the bottom of the stairs. If you do not have them, ask someone to add them for you. What else can I do to help prevent falls? Wear shoes that: Do not have high heels. Have  rubber bottoms. Are comfortable and fit you well. Are closed at the toe. Do not wear sandals. If you use a stepladder: Make sure that it is fully opened. Do not climb a closed stepladder. Make sure that both sides of the stepladder are locked into place. Ask someone to hold it for you, if possible. Clearly mark and make sure that you can see: Any grab bars or handrails. First and last steps. Where the edge of each step is. Use tools that help you move around (mobility aids) if they are needed. These include: Canes. Walkers. Scooters. Crutches. Turn on the lights when you go into a dark area. Replace any light bulbs as soon as they burn out. Set up your furniture so you have a clear path. Avoid moving your furniture around. If any of your floors are uneven, fix them. If there are any pets around you, be aware of where they are. Review your medicines with your doctor. Some medicines can make you feel dizzy. This can increase your chance of falling. Ask your doctor what other things that you can do to help prevent falls. This information is not intended to replace advice given to you by your health care provider. Make sure you discuss any questions you have with your health care provider. Document Released: 08/06/2009 Document Revised: 03/17/2016 Document Reviewed: 11/14/2014 Elsevier Interactive Patient Education  2017 [employer].

## 2022-10-24 DIAGNOSIS — H35351 Cystoid macular degeneration, right eye: Secondary | ICD-10-CM | POA: Diagnosis not present

## 2022-10-24 DIAGNOSIS — H59811 Chorioretinal scars after surgery for detachment, right eye: Secondary | ICD-10-CM | POA: Diagnosis not present

## 2022-11-16 ENCOUNTER — Telehealth: Payer: Self-pay

## 2022-11-16 NOTE — Telephone Encounter (Signed)
Pt called in wanting to update his pharmacy. Pt has changed pharmacy to the [facility] on  [address]. Please advise.  Cb#: [phone]

## 2022-11-24 DIAGNOSIS — Z79899 Other long term (current) drug therapy: Secondary | ICD-10-CM | POA: Diagnosis not present

## 2022-11-24 DIAGNOSIS — L2089 Other atopic dermatitis: Secondary | ICD-10-CM | POA: Diagnosis not present

## 2022-12-01 DIAGNOSIS — H35462 Secondary vitreoretinal degeneration, left eye: Secondary | ICD-10-CM | POA: Diagnosis not present

## 2022-12-01 DIAGNOSIS — H35351 Cystoid macular degeneration, right eye: Secondary | ICD-10-CM | POA: Diagnosis not present

## 2022-12-01 DIAGNOSIS — H43812 Vitreous degeneration, left eye: Secondary | ICD-10-CM | POA: Diagnosis not present

## 2022-12-01 DIAGNOSIS — H59811 Chorioretinal scars after surgery for detachment, right eye: Secondary | ICD-10-CM | POA: Diagnosis not present

## 2023-01-09 ENCOUNTER — Telehealth: Payer: Self-pay | Admitting: Family Medicine

## 2023-01-09 ENCOUNTER — Other Ambulatory Visit: Payer: Self-pay

## 2023-01-09 DIAGNOSIS — I10 Essential (primary) hypertension: Secondary | ICD-10-CM

## 2023-01-09 MED ORDER — AMLODIPINE BESYLATE 5 MG PO TABS: 5.0000 mg | ORAL_TABLET | Freq: Every day | ORAL | 0 refills | Status: DC

## 2023-01-09 NOTE — Telephone Encounter (Signed)
Prescription Request 01/09/2023 LOV: 09/19/2022  What is the name of the medication or equipment?   amLODipine (NORVASC) 5 MG tablet  **PATIENT HAS ENOUGH MEDICATION TO LAST ONE MORE WEEK** ** PATIENT REQUESTED PERMANENT PHARMACY CHANGE - SEE NEW LOCATION BELOW**  Have you contacted your pharmacy to request a refill? Yes   Which pharmacy would you like this sent to?   [facility] [address] [place], [place] [address] PHONE: [phone]   Patient notified that their request is being sent to the clinical staff for review and that they should receive a response within 2 business days.   Please advise patient when refill called in at [phone]

## 2023-02-15 ENCOUNTER — Other Ambulatory Visit: Payer: Self-pay | Admitting: Family Medicine

## 2023-02-15 DIAGNOSIS — I10 Essential (primary) hypertension: Secondary | ICD-10-CM

## 2023-02-27 DIAGNOSIS — L2089 Other atopic dermatitis: Secondary | ICD-10-CM | POA: Diagnosis not present

## 2023-02-27 DIAGNOSIS — L309 Dermatitis, unspecified: Secondary | ICD-10-CM | POA: Diagnosis not present

## 2023-02-27 DIAGNOSIS — Z79899 Other long term (current) drug therapy: Secondary | ICD-10-CM | POA: Diagnosis not present

## 2023-03-02 DIAGNOSIS — H59811 Chorioretinal scars after surgery for detachment, right eye: Secondary | ICD-10-CM | POA: Diagnosis not present

## 2023-03-02 DIAGNOSIS — H35351 Cystoid macular degeneration, right eye: Secondary | ICD-10-CM | POA: Diagnosis not present

## 2023-03-02 DIAGNOSIS — H43812 Vitreous degeneration, left eye: Secondary | ICD-10-CM | POA: Diagnosis not present

## 2023-03-24 ENCOUNTER — Encounter: Payer: Self-pay | Admitting: Family Medicine

## 2023-03-24 ENCOUNTER — Ambulatory Visit (INDEPENDENT_AMBULATORY_CARE_PROVIDER_SITE_OTHER): Payer: 59 | Admitting: Family Medicine

## 2023-03-24 VITALS — BP 124/82 | HR 67 | Temp 97.6°F | Ht 66.0 in | Wt 171.4 lb

## 2023-03-24 DIAGNOSIS — M545 Low back pain, unspecified: Secondary | ICD-10-CM | POA: Diagnosis not present

## 2023-03-24 DIAGNOSIS — I10 Essential (primary) hypertension: Secondary | ICD-10-CM

## 2023-03-24 DIAGNOSIS — E118 Type 2 diabetes mellitus with unspecified complications: Secondary | ICD-10-CM

## 2023-03-24 MED ORDER — AMLODIPINE BESYLATE 5 MG PO TABS: 5.0000 mg | ORAL_TABLET | Freq: Every day | ORAL | 3 refills | Status: DC

## 2023-03-24 NOTE — Progress Notes (Signed)
Subjective:    Patient ID: Adam Lozano, male    DOB: 1971-02-18, 52 y.o.   MRN: [id number] Patient is here today for a follow-up of his hypertension and his diabetes.  His hemoglobin A1c in November was outstanding.  He stopped taking amlodipine about 45 days ago.  His blood pressures consistently under 140/90.  He is exercising daily and is up to walking 3 or 4 miles every day.  This seems to be helping to control his blood pressure.  However he started noticing a popping sensation in his lower back is causing pain.  It sounds like he may be having crepitus between the facet joints.  He denies any neuropathy in his legs.  He has started eating grapefruit.  I recommended against eating grapefruit as this can increase the serum concentrations of methotrexate. Past Medical History:  Diagnosis Date   Asthma    Atopic dermatitis    Diabetes mellitus without complication (HCC)    borderline   GERD (gastroesophageal reflux disease)    Gout    Low back pain    Chronic   RLS (restless legs syndrome)    Past Surgical History:  Procedure Laterality Date   EYE SURGERY  02/24/14/15   cataracts rt eye   Current Outpatient Medications on File Prior to Visit  Medication Sig Dispense Refill   amLODipine (NORVASC) 5 MG tablet TAKE 1 TABLET(5 MG) BY MOUTH DAILY 90 tablet 0   Artificial Tear Ointment (ARTIFICIAL TEARS) ointment Place 1 application into both eyes daily.      budesonide-formoterol (SYMBICORT) 160-4.5 MCG/ACT inhaler INHALE 2 PUFFS BY MOUTH INTO THE LUNGS TWICE A DAY. 10.2 g 5   carboxymethylcellulose (REFRESH PLUS) 0.5 % SOLN Place 1 drop into both eyes 2 (two) times daily.      cetirizine (ZYRTEC) 10 MG tablet Take 10 mg by mouth daily.     HYDROcodone-acetaminophen (NORCO/VICODIN) 5-325 MG tablet TAKE ONE (1) TABLET BY MOUTH EVERY 6 HOURS AS NEEDED FOR MODERATE PAIN 30 tablet 0   hydrocortisone valerate cream (WESTCORT) 0.2 % Apply 1 application topically daily.     meloxicam (MOBIC)  7.5 MG tablet TAKE ONE (1) TABLET BY MOUTH EVERY DAY 90 tablet 3   methotrexate (RHEUMATREX) 2.5 MG tablet Take 12.5 mg by mouth once a week. Caution:Chemotherapy. Protect from light. Take on sundays Pt takes 4 pills every Sunday to equal 10 mg     PROLENSA 0.07 % SOLN Place 1 drop into the right eye at bedtime.     tizanidine (ZANAFLEX) 2 MG capsule Take 1 capsule (2 mg total) by mouth 3 (three) times daily. 360 capsule 0   No current facility-administered medications on file prior to visit.   Allergies  Allergen Reactions   Coconut (Cocos Nucifera) Hives and Itching    MAKES THROAT ITCH, ALSO   Other Hives and Itching    NO TREE NUTS   Peanut-Containing Drug Products Hives and Itching    MAKES THROAT ITCH, ALSO   Latex Rash   Social History   Socioeconomic History   Marital status: Single    Spouse name: Not on file   Number of children: 1   Years of education: Not on file   Highest education level: Not on file  Occupational History   Occupation: Disability  Tobacco Use   Smoking status: Former    Types: Cigarettes    Quit date: 11/25/2011    Years since quitting: 11.3   Smokeless tobacco: Never  Tobacco comments:    07/22/21-Pt declines low dose CT scan.  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   Mom living. 1 Brother, 1 daughter.   Social Determinants of Health   Financial Resource Strain: Low Risk  (10/06/2022)   Overall Financial Resource Strain (CARDIA)    Difficulty of Paying Living Expenses: Not hard at all  Food Insecurity: No Food Insecurity (10/06/2022)   Hunger Vital Sign    Worried About Running Out of Food in the Last Year: Never true    Ran Out of Food in the Last Year: Never true  Transportation Needs: No Transportation Needs (10/06/2022)   PRAPARE - Therapist, art (Medical): No    Lack of Transportation (Non-Medical): No  Physical Activity: Insufficiently Active (10/06/2022)   Exercise Vital Sign    Days of Exercise per Week: 3 days    Minutes of Exercise per Session: 30 min  Stress: No Stress Concern Present (10/06/2022)   [employer] of Occupational Health - Occupational Stress Questionnaire    Feeling of Stress : Not at all  Social Connections: Socially Isolated (10/06/2022)   Social Connection and Isolation Panel [NHANES]    Frequency of Communication with Friends and Family: More than three times a week    Frequency of Social Gatherings with Friends and Family: More than three times a week    Attends Religious Services: Never    Database administrator or Organizations: No    Attends Banker Meetings: Never    Marital Status: Never married  Intimate Partner Violence: Not At Risk (10/06/2022)   Humiliation, Afraid, Rape, and Kick questionnaire    Fear of Current or Ex-Partner: No    Emotionally Abused: No    Physically Abused: No    Sexually Abused: No      Review of Systems  Musculoskeletal:  Positive for back pain.  All other systems reviewed and are negative.      Objective:   Physical Exam Vitals reviewed.  Constitutional:      General: He is not in acute distress.    Appearance: Normal appearance. He is obese. He is not ill-appearing.  Cardiovascular:     Rate and Rhythm: Regular rhythm. Tachycardia present.     Pulses: Normal pulses.     Heart sounds: Normal heart sounds. No murmur heard.    No friction rub. No gallop.  Pulmonary:     Effort: Pulmonary effort is normal. No respiratory distress.     Breath sounds: Normal breath sounds. No stridor. No wheezing, rhonchi or rales.  Chest:     Chest wall: No tenderness.  Abdominal:     General: Bowel sounds are normal.     Palpations: Abdomen is soft.     Tenderness: There is no abdominal tenderness. There is no guarding or  rebound.  Musculoskeletal:     Right lower leg: No edema.     Left lower leg: No edema.  Skin:    Findings: Rash present.  Neurological:     Mental Status: He is alert.           Assessment & Plan:  Controlled type 2 diabetes mellitus with complication, without long-term current use of insulin (HCC) - Plan: Lipid panel, COMPLETE  METABOLIC PANEL WITH GFR, Hemoglobin A1c, Protein / Creatinine Ratio, Urine  Benign essential HTN I congratulated the patient on his exercise.  His blood pressure today is outstanding.  As long as he can keep his blood pressure under 140/90 I am fine with him stopping the amlodipine.  I recommended he avoid grapefruit due to potential impact on methotrexate.  Check CBC CMP lipid panel and A1c.  I did ask the patient to go get an x-ray of his back given the pain and the crepitus he is experiencing but I suspect that he is developing facet joint arthritis

## 2023-03-25 LAB — COMPLETE METABOLIC PANEL WITH GFR
AG Ratio: 2 (calc) (ref 1.0–2.5)
ALT: 15 U/L (ref 9–46)
AST: 12 U/L (ref 10–35)
Albumin: 4.7 g/dL (ref 3.6–5.1)
Alkaline phosphatase (APISO): 78 U/L (ref 35–144)
BUN: 11 mg/dL (ref 7–25)
CO2: 30 mmol/L (ref 20–32)
Calcium: 10.1 mg/dL (ref 8.6–10.3)
Chloride: 104 mmol/L (ref 98–110)
Creat: 0.84 mg/dL (ref 0.70–1.30)
Globulin: 2.4 g/dL (calc) (ref 1.9–3.7)
Glucose, Bld: 91 mg/dL (ref 65–99)
Potassium: 4.5 mmol/L (ref 3.5–5.3)
Sodium: 142 mmol/L (ref 135–146)
Total Bilirubin: 0.3 mg/dL (ref 0.2–1.2)
Total Protein: 7.1 g/dL (ref 6.1–8.1)
eGFR: 106 mL/min/{1.73_m2} (ref >=60)

## 2023-03-25 LAB — PROTEIN / CREATININE RATIO, URINE
Creatinine, Urine: 21 mg/dL (ref 20–320)
Total Protein, Urine: <4 mg/dL — ABNORMAL LOW (ref 5–25)

## 2023-03-25 LAB — HEMOGLOBIN A1C
Hgb A1c MFr Bld: 5.5 % of total Hgb (ref <5.7)
Mean Plasma Glucose: 111 mg/dL
eAG (mmol/L): 6.2 mmol/L

## 2023-03-25 LAB — LIPID PANEL
Cholesterol: 163 mg/dL (ref <200)
HDL: 60 mg/dL (ref >=40)
LDL Cholesterol (Calc): 84 mg/dL (calc)
Non-HDL Cholesterol (Calc): 103 mg/dL (calc) (ref <130)
Total CHOL/HDL Ratio: 2.7 (calc) (ref <5.0)
Triglycerides: 94 mg/dL (ref <150)

## 2023-03-27 ENCOUNTER — Ambulatory Visit (HOSPITAL_COMMUNITY)
Admission: RE | Admit: 2023-03-27 | Discharge: 2023-03-27 | Disposition: A | Discharge status: Home / Self Care | Payer: 59 | Source: Ambulatory Visit | Attending: Family Medicine | Admitting: Family Medicine

## 2023-03-27 DIAGNOSIS — M545 Low back pain, unspecified: Secondary | ICD-10-CM | POA: Insufficient documentation

## 2023-04-05 ENCOUNTER — Telehealth: Payer: Self-pay | Admitting: Family Medicine

## 2023-04-05 ENCOUNTER — Other Ambulatory Visit: Payer: Self-pay

## 2023-04-05 DIAGNOSIS — J4520 Mild intermittent asthma, uncomplicated: Secondary | ICD-10-CM

## 2023-04-05 MED ORDER — BUDESONIDE-FORMOTEROL FUMARATE 160-4.5 MCG/ACT IN AERO
INHALATION_SPRAY | RESPIRATORY_TRACT | 5 refills | Status: DC
Start: 2023-04-05 — End: 2024-08-16

## 2023-04-05 NOTE — Telephone Encounter (Signed)
Patient called to request update on status of PA for   budesonide-formoterol [facility]) 160-4.5 MCG/ACT inhaler   Pharmacy confirmed as:  Pharmacy  [facility] [phone] - [place], [place] - [address] AT [facility] OF [facility] [address] & [facility] [address], [place] [address] Phone: [phone]  Fax: [phone] DEA #: [id number]    Please advise at [phone].

## 2023-04-07 ENCOUNTER — Encounter: Payer: Self-pay | Admitting: Family Medicine

## 2023-06-08 DIAGNOSIS — H59811 Chorioretinal scars after surgery for detachment, right eye: Secondary | ICD-10-CM | POA: Diagnosis not present

## 2023-06-08 DIAGNOSIS — H43812 Vitreous degeneration, left eye: Secondary | ICD-10-CM | POA: Diagnosis not present

## 2023-06-08 DIAGNOSIS — H35351 Cystoid macular degeneration, right eye: Secondary | ICD-10-CM | POA: Diagnosis not present

## 2023-06-27 DIAGNOSIS — L2089 Other atopic dermatitis: Secondary | ICD-10-CM | POA: Diagnosis not present

## 2023-06-27 DIAGNOSIS — Z79899 Other long term (current) drug therapy: Secondary | ICD-10-CM | POA: Diagnosis not present

## 2023-07-18 ENCOUNTER — Encounter: Payer: Self-pay | Admitting: *Deleted

## 2023-07-18 NOTE — Progress Notes (Signed)
This patient has an upcoming appointment Please consider ordering a urine albumin/creatinine ratio lab test to complete the KED gap

## 2023-08-29 DIAGNOSIS — L2089 Other atopic dermatitis: Secondary | ICD-10-CM | POA: Diagnosis not present

## 2023-09-12 ENCOUNTER — Telehealth: Payer: Self-pay

## 2023-09-12 NOTE — Telephone Encounter (Signed)
Copied from CRM [id number]. Topic: Clinical - Medication Refill >> Sep 12, 2023  2:44 PM Herbert Seta B wrote: Most Recent Primary Care Visit:  Provider: Lynnea Ferrier T  Department: [facility]  Visit Type: OFFICE VISIT  Date: 03/24/2023  Medication:  tizanidine (ZANAFLEX) 2 MG capsule    Has the patient contacted their pharmacy? Yes-need refill (Agent: If no, request that the patient contact the pharmacy for the refill. If patient does not wish to contact the pharmacy document the reason why and proceed with request.) (Agent: If yes, when and what did the pharmacy advise?)  Is this the correct pharmacy for this prescription? yes If no, delete pharmacy and type the correct one.  This is the patient's preferred pharmacy:  [facility] #[id number] [place], [place] - [address] AT [facility] OF [facility] [address] & [facility] [address] [place] [address] Phone: [phone] Fax: [phone]   Has the prescription been filled recently? yes  Is the patient out of the medication? yes  Has the patient been seen for an appointment in the last year OR does the patient have an upcoming appointment? yes  Can we respond through MyChart? yes  Agent: Please be advised that Rx refills may take up to 3 business days. We ask that you follow-up with your pharmacy.

## 2023-09-18 ENCOUNTER — Other Ambulatory Visit: Payer: Self-pay

## 2023-09-18 NOTE — Telephone Encounter (Signed)
Requested medication (s) are due for refill today:   Provider to review  Requested medication (s) are on the active medication list:   Yes  Future visit scheduled:   Yes 11/27/2023   LOV 03/24/2023   Last ordered: 07/26/2022 #360, 0 refills   Non delegated refill   Requested Prescriptions  Pending Prescriptions Disp Refills   tizanidine (ZANAFLEX) 2 MG capsule 360 capsule 0    Sig: Take 1 capsule (2 mg total) by mouth 3 (three) times daily.     Not Delegated - Cardiovascular:  Alpha-2 Agonists - tizanidine Failed - 09/18/2023 12:34 PM      Failed - This refill cannot be delegated      Failed - Valid encounter within last 6 months    Recent Outpatient Visits           1 year ago Benign essential HTN   [facility] Donita Brooks, MD   2 years ago Controlled type 2 diabetes mellitus with complication, without long-term current use of insulin (HCC)   [facility] Donita Brooks, MD   2 years ago Benign essential HTN   [facility] Donita Brooks, MD   2 years ago Controlled type 2 diabetes mellitus with complication, without long-term current use of insulin (HCC)   [facility] Pickard, Priscille Heidelberg, MD   3 years ago Prediabetes   [facility] Pickard, Priscille Heidelberg, MD       Future Appointments             In 2 months Pickard, Priscille Heidelberg, MD [facility], [facility]

## 2023-09-18 NOTE — Telephone Encounter (Signed)
Copied from CRM [id number]. Topic: Clinical - Medication Refill >> Sep 15, 2023  3:06 PM Hector Shade B wrote: Most Recent Primary Care Visit:  Provider: Lynnea Ferrier T  Department: [facility]  Visit Type: OFFICE VISIT  Date: 03/24/2023  Medication: Tizandine  Has the patient contacted their pharmacy? Yes (Agent: If no, request that the patient contact the pharmacy for the refill. If patient does not wish to contact the pharmacy document the reason why and proceed with request.) (Agent: If yes, when and what did the pharmacy advise?)  Is this the correct pharmacy for this prescription? Yes If no, delete pharmacy and type the correct one.  This is the patient's preferred pharmacy:  [facility] #[id number] [place], [place] - [address] AT [facility] OF [facility] [address] & [facility] [address] [place] [address] Phone: [phone] Fax: [phone]   Has the prescription been filled recently? Yes  Is the patient out of the medication? Yes  Has the patient been seen for an appointment in the last year OR does the patient have an upcoming appointment? Yes  Can we respond through MyChart? Yes  Agent: Please be advised that Rx refills may take up to 3 business days. We ask that you follow-up with your pharmacy.

## 2023-09-25 ENCOUNTER — Ambulatory Visit: Payer: 59 | Admitting: Family Medicine

## 2023-09-26 DIAGNOSIS — Z79899 Other long term (current) drug therapy: Secondary | ICD-10-CM | POA: Diagnosis not present

## 2023-09-26 DIAGNOSIS — L2089 Other atopic dermatitis: Secondary | ICD-10-CM | POA: Diagnosis not present

## 2023-09-26 NOTE — Telephone Encounter (Signed)
Patient requesting an update on this refill

## 2023-10-02 MED ORDER — TIZANIDINE HCL 2 MG PO CAPS: 2.0000 mg | ORAL_CAPSULE | Freq: Three times a day (TID) | ORAL | 0 refills | Status: DC

## 2023-10-02 NOTE — Telephone Encounter (Signed)
Pt has called asking if this medication can be refilled. His last RF was 07/2022. Last OV was 03/24/2023. Thanks.

## 2023-10-06 ENCOUNTER — Other Ambulatory Visit: Payer: Self-pay | Admitting: Family Medicine

## 2023-10-06 ENCOUNTER — Telehealth: Payer: Self-pay | Admitting: Family Medicine

## 2023-10-06 MED ORDER — TIZANIDINE HCL 2 MG PO CAPS: 2.0000 mg | ORAL_CAPSULE | Freq: Three times a day (TID) | ORAL | 0 refills | Status: DC

## 2023-10-06 NOTE — Telephone Encounter (Signed)
Received fax from pharmacy stating patients plan does not cover tizanidine (ZANAFLEX) 2 MG capsule this  medication prescribed. Per RX benefit plan alternative medications incluede: Tizanide Tablets please fax back with approval along with strength, directions, quantity and refills.

## 2023-10-09 DIAGNOSIS — H43812 Vitreous degeneration, left eye: Secondary | ICD-10-CM | POA: Diagnosis not present

## 2023-10-09 DIAGNOSIS — H59811 Chorioretinal scars after surgery for detachment, right eye: Secondary | ICD-10-CM | POA: Diagnosis not present

## 2023-10-09 DIAGNOSIS — H35351 Cystoid macular degeneration, right eye: Secondary | ICD-10-CM | POA: Diagnosis not present

## 2023-11-27 ENCOUNTER — Ambulatory Visit: Payer: 59 | Admitting: Family Medicine

## 2023-12-25 DIAGNOSIS — L2089 Other atopic dermatitis: Secondary | ICD-10-CM | POA: Diagnosis not present

## 2023-12-25 DIAGNOSIS — Z79899 Other long term (current) drug therapy: Secondary | ICD-10-CM | POA: Diagnosis not present

## 2024-01-04 DIAGNOSIS — H31091 Other chorioretinal scars, right eye: Secondary | ICD-10-CM | POA: Diagnosis not present

## 2024-01-04 DIAGNOSIS — H2512 Age-related nuclear cataract, left eye: Secondary | ICD-10-CM | POA: Diagnosis not present

## 2024-01-04 DIAGNOSIS — H43812 Vitreous degeneration, left eye: Secondary | ICD-10-CM | POA: Diagnosis not present

## 2024-01-04 DIAGNOSIS — H35412 Lattice degeneration of retina, left eye: Secondary | ICD-10-CM | POA: Diagnosis not present

## 2024-01-04 DIAGNOSIS — H35351 Cystoid macular degeneration, right eye: Secondary | ICD-10-CM | POA: Diagnosis not present

## 2024-04-29 DIAGNOSIS — L2089 Other atopic dermatitis: Secondary | ICD-10-CM | POA: Diagnosis not present

## 2024-04-29 DIAGNOSIS — Z79899 Other long term (current) drug therapy: Secondary | ICD-10-CM | POA: Diagnosis not present

## 2024-06-02 ENCOUNTER — Other Ambulatory Visit: Payer: Self-pay | Admitting: Family Medicine

## 2024-06-13 ENCOUNTER — Other Ambulatory Visit: Payer: Self-pay | Admitting: Family Medicine

## 2024-06-13 DIAGNOSIS — H31091 Other chorioretinal scars, right eye: Secondary | ICD-10-CM | POA: Diagnosis not present

## 2024-06-13 DIAGNOSIS — H2512 Age-related nuclear cataract, left eye: Secondary | ICD-10-CM | POA: Diagnosis not present

## 2024-06-13 DIAGNOSIS — H35351 Cystoid macular degeneration, right eye: Secondary | ICD-10-CM | POA: Diagnosis not present

## 2024-06-13 DIAGNOSIS — H43812 Vitreous degeneration, left eye: Secondary | ICD-10-CM | POA: Diagnosis not present

## 2024-06-13 DIAGNOSIS — H35412 Lattice degeneration of retina, left eye: Secondary | ICD-10-CM | POA: Diagnosis not present

## 2024-06-14 NOTE — Telephone Encounter (Signed)
 Requested medications are due for refill today.  yes  Requested medications are on the active medications list.  yes  Last refill. 03/24/2023 #90 3 rf  Future visit scheduled.   no  Notes to clinic.  Pt is more than 3 months over due for an ov.      Requested Prescriptions  Pending Prescriptions Disp Refills   amLODipine  (NORVASC ) 5 MG tablet [Pharmacy Med Name: AMLODIPINE  BESYLATE 5MG  TABLETS] 90 tablet 3    Sig: TAKE 1 TABLET(5 MG) BY MOUTH DAILY     Cardiovascular: Calcium Channel Blockers 2 Failed - 06/14/2024 11:08 AM      Failed - Valid encounter within last 6 months    Recent Outpatient Visits           1 year ago Controlled type 2 diabetes mellitus with complication, without long-term current use of insulin (HCC)   [facility] Duanne Butler DASEN, MD   1 year ago Controlled type 2 diabetes mellitus with complication, without long-term current use of insulin [facility])   [facility] Pickard, Butler DASEN, MD              Passed - Last BP in normal range    BP Readings from Last 1 Encounters:  03/24/23 124/82         Passed - Last Heart Rate in normal range    Pulse Readings from Last 1 Encounters:  03/24/23 67

## 2024-06-30 ENCOUNTER — Other Ambulatory Visit: Payer: Self-pay | Admitting: Family Medicine

## 2024-06-30 NOTE — Telephone Encounter (Signed)
 Prescription Request  06/30/2024  LOV: Visit date not found  What is the name of the medication or equipment? amLODipine  (NORVASC ) 5 MG tablet   Have you contacted your pharmacy to request a refill? Yes   Which pharmacy would you like this sent to?  [id number] [place], [place] - [address] AT [facility] [address] & [facility] [address] [place] [address] Phone: [phone] Fax: [phone]    Patient notified that their request is being sent to the clinical staff for review and that they should receive a response within 2 business days.   Please advise at [facility] [phone]

## 2024-07-02 NOTE — Telephone Encounter (Signed)
 Unable to refill per protocol, appointment needed.   Requested Prescriptions  Pending Prescriptions Disp Refills   amLODipine  (NORVASC ) 5 MG tablet 90 tablet 3    Sig: Take 1 tablet (5 mg total) by mouth daily.     Cardiovascular: Calcium Channel Blockers 2 Failed - 07/02/2024  8:35 AM      Failed - Valid encounter within last 6 months    Recent Outpatient Visits           1 year ago Controlled type 2 diabetes mellitus with complication, without long-term current use of insulin (HCC)   [place] [facility] Duanne Butler DASEN, MD   1 year ago Controlled type 2 diabetes mellitus with complication, without long-term current use of insulin [facility])   [place] [facility] Pickard, Butler DASEN, MD              Passed - Last BP in normal range    BP Readings from Last 1 Encounters:  03/24/23 124/82         Passed - Last Heart Rate in normal range    Pulse Readings from Last 1 Encounters:  03/24/23 67

## 2024-08-16 ENCOUNTER — Encounter: Payer: Self-pay | Admitting: Family Medicine

## 2024-08-16 ENCOUNTER — Ambulatory Visit (INDEPENDENT_AMBULATORY_CARE_PROVIDER_SITE_OTHER): Admitting: Family Medicine

## 2024-08-16 VITALS — BP 142/82 | HR 92 | Temp 97.9°F | Ht 66.0 in | Wt 182.3 lb

## 2024-08-16 DIAGNOSIS — Z125 Encounter for screening for malignant neoplasm of prostate: Secondary | ICD-10-CM | POA: Diagnosis not present

## 2024-08-16 DIAGNOSIS — E118 Type 2 diabetes mellitus with unspecified complications: Secondary | ICD-10-CM

## 2024-08-16 DIAGNOSIS — I10 Essential (primary) hypertension: Secondary | ICD-10-CM | POA: Diagnosis not present

## 2024-08-16 MED ORDER — AMLODIPINE BESYLATE 5 MG PO TABS: 5.0000 mg | ORAL_TABLET | Freq: Every day | ORAL | 3 refills | Status: AC

## 2024-08-16 NOTE — Progress Notes (Signed)
 Subjective:    Patient ID: Adam Lozano, male    DOB: 19-Oct-1971, 53 y.o.   MRN: [id number] Patient is here today for a follow-up of his hypertension and his diabetes.  First, the patient quit smoking marijuana.  I congratulated him on that.  He stopped taking his amlodipine .  His blood pressure here today is slightly elevated.  He denies any chest pain or shortness of breath.  He is complaining of sinus congestion.  Both nostrils are completely occluded with rhinorrhea.  This occurred after mowing his yard.  He appears to have allergies to ragweed.  He is taking Claritin with no relief.  He is due for a flu shot as well as the shingles vaccine.  He declines all vaccinations today despite being immunocompromised. Past Medical History:  Diagnosis Date   Asthma    Atopic dermatitis    Diabetes mellitus without complication (HCC)    borderline   GERD (gastroesophageal reflux disease)    Gout    Low back pain    Chronic   RLS (restless legs syndrome)    Past Surgical History:  Procedure Laterality Date   EYE SURGERY  02/24/14/15   cataracts rt eye   Current Outpatient Medications on File Prior to Visit  Medication Sig Dispense Refill   carboxymethylcellulose (REFRESH PLUS) 0.5 % SOLN Place 1 drop into both eyes 2 (two) times daily.      cetirizine (ZYRTEC) 10 MG tablet Take 10 mg by mouth daily.     HYDROcodone -acetaminophen  (NORCO/VICODIN) 5-325 MG tablet TAKE ONE (1) TABLET BY MOUTH EVERY 6 HOURS AS NEEDED FOR MODERATE PAIN 30 tablet 0   hydrocortisone valerate cream (WESTCORT) 0.2 % Apply 1 application topically daily.     methotrexate (RHEUMATREX) 2.5 MG tablet Take 12.5 mg by mouth once a week. Caution:Chemotherapy. Protect from light. Take on sundays Pt takes 4 pills every Sunday to equal 10 mg     tizanidine  (ZANAFLEX ) 2 MG capsule Take 1 capsule (2 mg total) by mouth 3 (three) times daily. 360 capsule 0   No current facility-administered medications on file prior to visit.    Allergies  Allergen Reactions   Coconut (Cocos Nucifera) Hives and Itching    MAKES THROAT ITCH, ALSO   Other Hives and Itching    NO TREE NUTS   Peanut-Containing Drug Products Hives and Itching    MAKES THROAT ITCH, ALSO   Latex Rash   Social History   Socioeconomic History   Marital status: Single    Spouse name: Not on file   Number of children: 1   Years of education: Not on file   Highest education level: 11th grade  Occupational History   Occupation: Disability  Tobacco Use   Smoking status: Former    Current packs/day: 0.00    Types: Cigarettes    Quit date: 11/25/2011    Years since quitting: 12.7   Smokeless tobacco: Never   Tobacco comments:    07/22/21-Pt declines low dose CT scan.  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   Mom living. 1 Brother, 1 daughter.   Social Drivers of Corporate investment banker Strain: Low Risk  (08/14/2024)   Overall Financial Resource Strain (CARDIA)    Difficulty of Paying Living Expenses:  Not very hard  Food Insecurity: Food Insecurity Present (08/14/2024)   Hunger Vital Sign    Worried About Running Out of Food in the Last Year: Sometimes true    Ran Out of Food in the Last Year: Sometimes true  Transportation Needs: No Transportation Needs (08/14/2024)   PRAPARE - Administrator, Civil Service (Medical): No    Lack of Transportation (Non-Medical): No  Physical Activity: Insufficiently Active (08/14/2024)   Exercise Vital Sign    Days of Exercise per Week: 1 day    Minutes of Exercise per Session: 20 min  Stress: No Stress Concern Present (08/14/2024)   [employer] of Occupational Health - Occupational Stress Questionnaire    Feeling of Stress: Only a little  Social Connections: Moderately Integrated (08/14/2024)    Social Connection and Isolation Panel    Frequency of Communication with Friends and Family: Twice a week    Frequency of Social Gatherings with Friends and Family: Once a week    Attends Religious Services: More than 4 times per year    Active Member of [employer] or Organizations: Yes    Attends Banker Meetings: More than 4 times per year    Marital Status: Separated  Intimate Partner Violence: Not At Risk (10/06/2022)   Humiliation, Afraid, Rape, and Kick questionnaire    Fear of Current or Ex-Partner: No    Emotionally Abused: No    Physically Abused: No    Sexually Abused: No      Review of Systems  Musculoskeletal:  Positive for back pain.  All other systems reviewed and are negative.      Objective:   Physical Exam Vitals reviewed.  Constitutional:      General: He is not in acute distress.    Appearance: Normal appearance. He is obese. He is not ill-appearing.  Cardiovascular:     Rate and Rhythm: Regular rhythm. Tachycardia present.     Pulses: Normal pulses.     Heart sounds: Normal heart sounds. No murmur heard.    No friction rub. No gallop.  Pulmonary:     Effort: Pulmonary effort is normal. No respiratory distress.     Breath sounds: Normal breath sounds. No stridor. No wheezing, rhonchi or rales.  Chest:     Chest wall: No tenderness.  Abdominal:     General: Bowel sounds are normal.     Palpations: Abdomen is soft.     Tenderness: There is no abdominal tenderness. There is no guarding or rebound.  Musculoskeletal:     Right lower leg: No edema.     Left lower leg: No edema.  Skin:    Findings: Rash present.  Neurological:     Mental Status: He is alert.           Assessment & Plan:  Benign essential HTN  Controlled type 2 diabetes mellitus with complication, without long-term current use of insulin (HCC) - Plan: Hemoglobin A1c, CBC with Differential/Platelet, Comprehensive metabolic panel with GFR, Lipid panel,  Microalbumin/Creatinine Ratio, Urine  Prostate cancer screening - Plan: PSA  Blood pressure is elevated.  I recommended that he resume amlodipine  5 mg a day.  I will check a CBC CMP lipid panel along with a hemoglobin A1c.  Goal LDL cholesterol is less than 100.  I would like to see his hemoglobin A1c under 6.5.  I would like to see if microalbumin to creatinine ratio less than 30.  Screening for prostate cancer with a PSA.  Patient declines any flu shot or shingles vaccine.  I recommended using Afrin for 3 days to lessen nasal congestion.  Also recommended he use Flonase concomitantly with the Afrin to fight his allergic rhinosinusitis

## 2024-08-17 LAB — LIPID PANEL
Cholesterol: 189 mg/dL (ref <200)
HDL: 51 mg/dL (ref >=40)
LDL Cholesterol (Calc): 109 mg/dL — ABNORMAL HIGH
Non-HDL Cholesterol (Calc): 138 mg/dL — ABNORMAL HIGH (ref <130)
Total CHOL/HDL Ratio: 3.7 (calc) (ref <5.0)
Triglycerides: 175 mg/dL — ABNORMAL HIGH (ref <150)

## 2024-08-17 LAB — COMPREHENSIVE METABOLIC PANEL WITH GFR
AG Ratio: 2 (calc) (ref 1.0–2.5)
ALT: 21 U/L (ref 9–46)
AST: 23 U/L (ref 10–35)
Albumin: 4.9 g/dL (ref 3.6–5.1)
Alkaline phosphatase (APISO): 71 U/L (ref 35–144)
BUN: 7 mg/dL (ref 7–25)
CO2: 26 mmol/L (ref 20–32)
Calcium: 9.5 mg/dL (ref 8.6–10.3)
Chloride: 102 mmol/L (ref 98–110)
Creat: 0.8 mg/dL (ref 0.70–1.30)
Globulin: 2.4 g/dL (ref 1.9–3.7)
Glucose, Bld: 83 mg/dL (ref 65–99)
Potassium: 4 mmol/L (ref 3.5–5.3)
Sodium: 139 mmol/L (ref 135–146)
Total Bilirubin: 0.4 mg/dL (ref 0.2–1.2)
Total Protein: 7.3 g/dL (ref 6.1–8.1)
eGFR: 107 mL/min/1.73m2 (ref >=60)

## 2024-08-17 LAB — CBC WITH DIFFERENTIAL/PLATELET
Absolute Lymphocytes: 1215 {cells}/uL (ref 850–3900)
Absolute Monocytes: 948 {cells}/uL (ref 200–950)
Basophils Absolute: 41 {cells}/uL (ref 0–200)
Basophils Relative: 0.4 %
Eosinophils Absolute: 1339 {cells}/uL — ABNORMAL HIGH (ref 15–500)
Eosinophils Relative: 13 %
HCT: 43.3 % (ref 38.5–50.0)
Hemoglobin: 14.6 g/dL (ref 13.2–17.1)
MCH: 29.9 pg (ref 27.0–33.0)
MCHC: 33.7 g/dL (ref 32.0–36.0)
MCV: 88.5 fL (ref 80.0–100.0)
MPV: 9.9 fL (ref 7.5–12.5)
Monocytes Relative: 9.2 %
Neutro Abs: 6757 {cells}/uL (ref 1500–7800)
Neutrophils Relative %: 65.6 %
Platelets: 254 Thousand/uL (ref 140–400)
RBC: 4.89 Million/uL (ref 4.20–5.80)
RDW: 13.7 % (ref 11.0–15.0)
Total Lymphocyte: 11.8 %
WBC: 10.3 Thousand/uL (ref 3.8–10.8)

## 2024-08-17 LAB — HEMOGLOBIN A1C
Hgb A1c MFr Bld: 5.9 % — ABNORMAL HIGH (ref <5.7)
Mean Plasma Glucose: 123 mg/dL
eAG (mmol/L): 6.8 mmol/L

## 2024-08-17 LAB — MICROALBUMIN / CREATININE URINE RATIO
Creatinine, Urine: 54 mg/dL (ref 20–320)
Microalb Creat Ratio: 6 mg/g{creat} (ref <30)
Microalb, Ur: 0.3 mg/dL

## 2024-08-17 LAB — PSA: PSA: 0.76 ng/mL (ref <=4.00)

## 2024-08-19 ENCOUNTER — Other Ambulatory Visit: Payer: Self-pay

## 2024-08-19 ENCOUNTER — Ambulatory Visit: Payer: Self-pay | Admitting: Family Medicine

## 2024-08-19 MED ORDER — ROSUVASTATIN CALCIUM 10 MG PO TABS: 10.0000 mg | ORAL_TABLET | Freq: Every day | ORAL | 3 refills | Status: AC

## 2024-08-28 ENCOUNTER — Encounter: Payer: Self-pay | Admitting: Family Medicine

## 2024-10-22 ENCOUNTER — Other Ambulatory Visit: Payer: Self-pay | Admitting: Family Medicine
